# Patient Record
Sex: Female | Born: 1935 | Race: White | Hispanic: No | Smoking: Never smoker
Health system: Southern US, Community
[De-identification: ages and names within clinical notes are randomized; demographics above are authoritative.]

## PROBLEM LIST (undated history)

## (undated) DIAGNOSIS — I10 Essential (primary) hypertension: Secondary | ICD-10-CM

## (undated) DIAGNOSIS — C801 Malignant (primary) neoplasm, unspecified: Secondary | ICD-10-CM

## (undated) DIAGNOSIS — F0390 Unspecified dementia without behavioral disturbance: Secondary | ICD-10-CM

## (undated) DIAGNOSIS — E039 Hypothyroidism, unspecified: Secondary | ICD-10-CM

---

## 2004-01-06 ENCOUNTER — Ambulatory Visit: Payer: Self-pay | Admitting: Internal Medicine

## 2004-12-10 ENCOUNTER — Ambulatory Visit: Payer: Self-pay | Admitting: Urology

## 2005-01-14 ENCOUNTER — Ambulatory Visit: Payer: Self-pay | Admitting: Internal Medicine

## 2005-03-04 ENCOUNTER — Ambulatory Visit: Payer: Self-pay | Admitting: Obstetrics and Gynecology

## 2005-03-04 ENCOUNTER — Other Ambulatory Visit: Payer: Self-pay

## 2005-03-11 ENCOUNTER — Ambulatory Visit: Payer: Self-pay | Admitting: Obstetrics and Gynecology

## 2005-07-26 ENCOUNTER — Ambulatory Visit: Payer: Self-pay

## 2005-08-01 ENCOUNTER — Ambulatory Visit: Payer: Self-pay

## 2006-01-16 ENCOUNTER — Ambulatory Visit: Payer: Self-pay | Admitting: Internal Medicine

## 2006-01-20 ENCOUNTER — Ambulatory Visit: Payer: Self-pay | Admitting: Internal Medicine

## 2006-08-02 ENCOUNTER — Ambulatory Visit: Payer: Self-pay | Admitting: Unknown Physician Specialty

## 2007-01-19 ENCOUNTER — Ambulatory Visit: Payer: Self-pay | Admitting: Internal Medicine

## 2007-04-23 ENCOUNTER — Emergency Department: Payer: Self-pay | Admitting: Emergency Medicine

## 2007-07-05 ENCOUNTER — Ambulatory Visit: Payer: Self-pay | Admitting: Internal Medicine

## 2007-11-13 ENCOUNTER — Other Ambulatory Visit: Payer: Self-pay

## 2007-11-13 ENCOUNTER — Ambulatory Visit: Payer: Self-pay | Admitting: Ophthalmology

## 2007-11-26 ENCOUNTER — Ambulatory Visit: Payer: Self-pay | Admitting: Ophthalmology

## 2008-01-24 ENCOUNTER — Ambulatory Visit: Payer: Self-pay | Admitting: Internal Medicine

## 2009-01-29 ENCOUNTER — Ambulatory Visit: Payer: Self-pay | Admitting: Internal Medicine

## 2010-02-02 ENCOUNTER — Ambulatory Visit: Payer: Self-pay | Admitting: Internal Medicine

## 2011-03-10 ENCOUNTER — Ambulatory Visit: Payer: Self-pay | Admitting: Family Medicine

## 2013-05-02 ENCOUNTER — Ambulatory Visit: Payer: Self-pay | Admitting: Ophthalmology

## 2013-05-02 DIAGNOSIS — I10 Essential (primary) hypertension: Secondary | ICD-10-CM

## 2013-05-13 ENCOUNTER — Ambulatory Visit: Payer: Self-pay | Admitting: Ophthalmology

## 2014-07-26 NOTE — Op Note (Signed)
PATIENT NAME:  Michele Lambert, Michele Lambert MR#:  [id number] DATE OF BIRTH:  Mar 27, 1927  DATE OF PROCEDURE:  05/13/2013  PREOPERATIVE DIAGNOSIS:  Cataract, right eye.  POSTOPERATIVE DIAGNOSIS:  Cataract, right eye.  PROCEDURE PERFORMED:  Extracapsular cataract extraction using phacoemulsification with placement of an Alcon [id number], 18.5-diopter posterior chamber lens, serial number [id number].  SURGEON:  Loura Back. Taia Bramlett, MD  ASSISTANT:  None.  ANESTHESIA:  4% lidocaine and 0.75% Marcaine in a 50/50 mixture with Hylenex 10 units/mL added, given as a peribulbar.  ANESTHESIOLOGIST:  Dr. Marcello Moores.  COMPLICATIONS:  None.  ESTIMATED BLOOD LOSS:  Less than 1 ml.  DESCRIPTION OF PROCEDURE:  The patient was brought to the operating room and given a peribulbar block.  The patient was then prepped and draped in the usual fashion.  The vertical rectus muscles were imbricated using 5-0 silk sutures.  These sutures were then clamped to the sterile drapes as bridle sutures.  A limbal peritomy was performed extending two clock hours and hemostasis was obtained with cautery.  A partial thickness scleral groove was made at the surgical limbus and dissected anteriorly in a lamellar dissection using an Alcon crescent knife.  The anterior chamber was entered supero-temporally with a Superblade and through the lamellar dissection with a 2.6 mm keratome.  DisCoVisc was used to replace the aqueous and a continuous tear capsulorrhexis was carried out.  Hydrodissection and hydrodelineation were carried out with balanced salt and a 27 gauge canula.  The nucleus was rotated to confirm the effectiveness of the hydrodissection.  Phacoemulsification was carried out using a divide-and-conquer technique.  Total ultrasound time was 1 minute and 28 seconds with an average power of 24.4 percent.  CDE 35.29.    Irrigation/aspiration was used to remove the residual cortex.  DisCoVisc was used to inflate the capsule and the internal  incision was enlarged to 3 mm with the crescent knife.  The intraocular lens was folded and inserted into the capsular bag using an AcrySert delivery system.  Irrigation/aspiration was used to remove the residual DisCoVisc.  Miostat was injected into the anterior chamber through the paracentesis track to inflate the anterior chamber and induce miosis.  Cefuroxime 0.1 mL containing 1 mg of drug was injected via the paracentesis track.  The wound was checked for leaks and wound leakage was found.  A single 10-0 suture was placed across the incision, tied and the knot was rotated superiorly.  The conjunctiva was closed with cautery and the bridle sutures were removed.  Two drops of 0.3% Vigamox were placed on the eye.   An eye shield was placed on the eye.  The patient was discharged to the recovery room in good condition.  ____________________________ Loura Back Havoc Sanluis, MD sad:cs D: 05/13/2013 13:14:45 ET T: 05/13/2013 19:58:11 ET JOB#: [id number]  cc: Remo Lipps A. Christabelle Hanzlik, MD, <Dictator> Martie Lee MD ELECTRONICALLY SIGNED 05/20/2013 11:57

## 2017-11-21 ENCOUNTER — Other Ambulatory Visit: Payer: Self-pay

## 2017-11-21 ENCOUNTER — Encounter: Payer: Self-pay | Admitting: Emergency Medicine

## 2017-11-21 ENCOUNTER — Emergency Department
Admission: EM | Admit: 2017-11-21 | Discharge: 2017-11-22 | Disposition: A | Discharge status: Home / Self Care | Payer: Medicare Other | Attending: Emergency Medicine | Admitting: Emergency Medicine

## 2017-11-21 ENCOUNTER — Emergency Department: Payer: Medicare Other

## 2017-11-21 DIAGNOSIS — W19XXXA Unspecified fall, initial encounter: Secondary | ICD-10-CM

## 2017-11-21 DIAGNOSIS — E039 Hypothyroidism, unspecified: Secondary | ICD-10-CM | POA: Insufficient documentation

## 2017-11-21 DIAGNOSIS — T148XXA Other injury of unspecified body region, initial encounter: Secondary | ICD-10-CM

## 2017-11-21 DIAGNOSIS — S0993XA Unspecified injury of face, initial encounter: Secondary | ICD-10-CM | POA: Diagnosis present

## 2017-11-21 DIAGNOSIS — S0081XA Abrasion of other part of head, initial encounter: Secondary | ICD-10-CM | POA: Insufficient documentation

## 2017-11-21 DIAGNOSIS — Y998 Other external cause status: Secondary | ICD-10-CM | POA: Diagnosis not present

## 2017-11-21 DIAGNOSIS — Y929 Unspecified place or not applicable: Secondary | ICD-10-CM | POA: Insufficient documentation

## 2017-11-21 DIAGNOSIS — S022XXB Fracture of nasal bones, initial encounter for open fracture: Secondary | ICD-10-CM | POA: Insufficient documentation

## 2017-11-21 DIAGNOSIS — W010XXA Fall on same level from slipping, tripping and stumbling without subsequent striking against object, initial encounter: Secondary | ICD-10-CM | POA: Insufficient documentation

## 2017-11-21 DIAGNOSIS — F0390 Unspecified dementia without behavioral disturbance: Secondary | ICD-10-CM | POA: Insufficient documentation

## 2017-11-21 DIAGNOSIS — Y9301 Activity, walking, marching and hiking: Secondary | ICD-10-CM | POA: Insufficient documentation

## 2017-11-21 HISTORY — DX: Hypothyroidism, unspecified: E03.9

## 2017-11-21 MED ORDER — AMOXICILLIN-POT CLAVULANATE 875-125 MG PO TABS: 1.0000 | ORAL_TABLET | Freq: Two times a day (BID) | ORAL | 0 refills | Status: AC

## 2017-11-21 NOTE — ED Notes (Signed)
Pt cleaned with sterile water and 4x4s on the face.

## 2017-11-21 NOTE — Discharge Instructions (Addendum)
Please seek medical attention for any high fevers, chest pain, shortness of breath, change in behavior, persistent vomiting, bloody stool or any other new or concerning symptoms.  

## 2017-11-21 NOTE — ED Provider Notes (Signed)
[facility] Emergency Department Provider Note   ____________________________________________   I have reviewed the triage vital signs and the nursing notes.   HISTORY  Chief Complaint Fall   History limited by and level 5 caveat due to: Dementia, history primarily obtained from family   HPI Michele Lambert is a 84 y.o. female who presents to the emergency department today after a fall.  Family states she was walking on a course of gravel when she fell.  Apparently the fall was unwitnessed however family did get on scene almost immediately.  The patient was awake and was able to get herself up off the ground.  Patient herself is unsure why she fell.  Family however is fairly confident that she fell secondary to tripping.  No recent fevers or signs of illness per family. Family does state that she has memory issues.  Per medical record review patient has a history of hypothyrodism  Past Medical History:  Diagnosis Date  . Hypothyroidism     There are no active problems to display for this patient.   History reviewed. No pertinent surgical history.  Prior to Admission medications   Not on File    Allergies Codeine and Lescol [fluvastatin sodium]  No family history on file.  Social History Social History   Tobacco Use  . Smoking status: Never Smoker  . Smokeless tobacco: Never Used  Substance Use Topics  . Alcohol use: Not on file  . Drug use: Not on file    Review of Systems Unable to obtain reliable ROS secondary to dementia. ____________________________________________   PHYSICAL EXAM:  VITAL SIGNS: ED Triage Vitals  Enc Vitals Group     BP 11/21/17 2024 (!) 179/85     Pulse Rate 11/21/17 2024 84     Resp 11/21/17 2024 18     Temp 11/21/17 2024 97.9 F (36.6 C)     Temp Source 11/21/17 2024 Oral     SpO2 11/21/17 2024 97 %     Weight 11/21/17 2022 114 lb (51.7 kg)     Height 11/21/17 2022 5\' 3"  (1.6 m)     Head Circumference  --      Peak Flow --      Pain Score 11/21/17 2021 5   Constitutional: Alert and oriented.  Eyes: Conjunctivae are normal.  ENT      Head: Normocephalic. Abrasion noted to bridge of nose and left cheek bone. No hemotympanum.       Nose: Some dried blood noted in nares. No septal hematoma.       Mouth/Throat: Mucous membranes are moist.      Neck: No stridor. No midline tenderness. Hematological/Lymphatic/Immunilogical: No cervical lymphadenopathy. Cardiovascular: Normal rate, regular rhythm.  No murmurs, rubs, or gallops.  Respiratory: Normal respiratory effort without tachypnea nor retractions. Breath sounds are clear and equal bilaterally. No wheezes/rales/rhonchi. Gastrointestinal: Soft and non tender. No rebound. No guarding.  Genitourinary: Deferred Musculoskeletal: Normal range of motion in all extremities. No tenderness to upper or lower extremities. Small abrasion noted to left knee cap. Neurologic:   No gross focal neurologic deficits are appreciated.  Skin:  Skin is warm, dry and intact. No rash noted. Psychiatric: Mood and affect are normal. Speech and behavior are normal. Patient exhibits appropriate insight and judgment.  ____________________________________________    LABS (pertinent positives/negatives)  None  ____________________________________________   EKG  None  ____________________________________________    RADIOLOGY  CT head/c spine/max face Nasal bone fracture. No intracranial bleed.  ____________________________________________   PROCEDURES  Procedures  ____________________________________________   INITIAL IMPRESSION / ASSESSMENT AND PLAN / ED COURSE  Pertinent labs & imaging results that were available during my care of the patient were reviewed by me and considered in my medical decision making (see chart for details).   Patient presented to the emergency department today because of concerns for facial injury after tripping.  CT  scans face C-spine and head showed nasal bone fracture no other concerning acute findings.  Will start patient on antibiotics for the nasal bone fracture.  Discussed these findings plan with patient and family.   ____________________________________________   FINAL CLINICAL IMPRESSION(S) / ED DIAGNOSES  Final diagnoses:  Fall, initial encounter  Open fracture of nasal bone, initial encounter  Abrasion     Note: This dictation was prepared with Dragon dictation. Any transcriptional errors that result from this process are unintentional     Nance Pear, MD 11/21/17 2338

## 2017-11-21 NOTE — ED Triage Notes (Addendum)
Pt to triage via w/c with no distress noted; reports tripped outside falling forward; denies LOC or HA; c/o left sided facial pain; bruising to nose with small abrasions to nose, forehead and left cheek; wounds clensed; small abrasion noted to left knee but pt denies knee pain with palpation; ice pack given to pt to hold to face

## 2017-11-21 NOTE — ED Notes (Signed)
Pt fell today after she tripped on feet. Pt bleeding on face and knee. Bleeding is controlled.

## 2018-05-01 ENCOUNTER — Encounter (HOSPITAL_COMMUNITY): Payer: Self-pay | Admitting: Emergency Medicine

## 2018-05-01 ENCOUNTER — Emergency Department (HOSPITAL_COMMUNITY): Payer: Medicare Other

## 2018-05-01 ENCOUNTER — Inpatient Hospital Stay (HOSPITAL_COMMUNITY)
Admission: EM | Admit: 2018-05-01 | Discharge: 2018-05-06 | DRG: 493 | Disposition: A | Discharge status: Skilled Nursing Facility | Payer: Medicare Other | Attending: Family Medicine | Admitting: Family Medicine

## 2018-05-01 DIAGNOSIS — I10 Essential (primary) hypertension: Secondary | ICD-10-CM | POA: Diagnosis present

## 2018-05-01 DIAGNOSIS — S42301A Unspecified fracture of shaft of humerus, right arm, initial encounter for closed fracture: Secondary | ICD-10-CM | POA: Diagnosis present

## 2018-05-01 DIAGNOSIS — S42201A Unspecified fracture of upper end of right humerus, initial encounter for closed fracture: Secondary | ICD-10-CM

## 2018-05-01 DIAGNOSIS — S0181XA Laceration without foreign body of other part of head, initial encounter: Secondary | ICD-10-CM

## 2018-05-01 DIAGNOSIS — Z66 Do not resuscitate: Secondary | ICD-10-CM | POA: Diagnosis present

## 2018-05-01 DIAGNOSIS — Z885 Allergy status to narcotic agent status: Secondary | ICD-10-CM

## 2018-05-01 DIAGNOSIS — T148XXA Other injury of unspecified body region, initial encounter: Secondary | ICD-10-CM

## 2018-05-01 DIAGNOSIS — Z888 Allergy status to other drugs, medicaments and biological substances status: Secondary | ICD-10-CM

## 2018-05-01 DIAGNOSIS — R339 Retention of urine, unspecified: Secondary | ICD-10-CM | POA: Diagnosis present

## 2018-05-01 DIAGNOSIS — S42409A Unspecified fracture of lower end of unspecified humerus, initial encounter for closed fracture: Secondary | ICD-10-CM | POA: Diagnosis present

## 2018-05-01 DIAGNOSIS — R52 Pain, unspecified: Secondary | ICD-10-CM

## 2018-05-01 DIAGNOSIS — G9340 Encephalopathy, unspecified: Secondary | ICD-10-CM | POA: Diagnosis present

## 2018-05-01 DIAGNOSIS — S0990XA Unspecified injury of head, initial encounter: Secondary | ICD-10-CM

## 2018-05-01 DIAGNOSIS — Z79890 Hormone replacement therapy (postmenopausal): Secondary | ICD-10-CM

## 2018-05-01 DIAGNOSIS — R202 Paresthesia of skin: Secondary | ICD-10-CM | POA: Diagnosis present

## 2018-05-01 DIAGNOSIS — Y92015 Private garage of single-family (private) house as the place of occurrence of the external cause: Secondary | ICD-10-CM

## 2018-05-01 DIAGNOSIS — S42491A Other displaced fracture of lower end of right humerus, initial encounter for closed fracture: Principal | ICD-10-CM

## 2018-05-01 DIAGNOSIS — F0390 Unspecified dementia without behavioral disturbance: Secondary | ICD-10-CM | POA: Diagnosis present

## 2018-05-01 DIAGNOSIS — E039 Hypothyroidism, unspecified: Secondary | ICD-10-CM

## 2018-05-01 DIAGNOSIS — W1830XA Fall on same level, unspecified, initial encounter: Secondary | ICD-10-CM | POA: Diagnosis present

## 2018-05-01 DIAGNOSIS — Z419 Encounter for procedure for purposes other than remedying health state, unspecified: Secondary | ICD-10-CM

## 2018-05-01 DIAGNOSIS — R296 Repeated falls: Secondary | ICD-10-CM

## 2018-05-01 DIAGNOSIS — R410 Disorientation, unspecified: Secondary | ICD-10-CM | POA: Diagnosis present

## 2018-05-01 DIAGNOSIS — S0011XA Contusion of right eyelid and periocular area, initial encounter: Secondary | ICD-10-CM | POA: Diagnosis present

## 2018-05-01 HISTORY — DX: Unspecified dementia, unspecified severity, without behavioral disturbance, psychotic disturbance, mood disturbance, and anxiety: F03.90

## 2018-05-01 LAB — BASIC METABOLIC PANEL
Anion gap: 9 (ref 5–15)
BUN: 32 mg/dL — ABNORMAL HIGH (ref 8–23)
CO2: 25 mmol/L (ref 22–32)
Calcium: 8.8 mg/dL — ABNORMAL LOW (ref 8.9–10.3)
Chloride: 105 mmol/L (ref 98–111)
Creatinine, Ser: 0.99 mg/dL (ref 0.44–1.00)
GFR calc non Af Amer: 51 mL/min — ABNORMAL LOW (ref >60)
GFR, EST AFRICAN AMERICAN: 59 mL/min — AB (ref >60)
Glucose, Bld: 125 mg/dL — ABNORMAL HIGH (ref 70–99)
Potassium: 3.8 mmol/L (ref 3.5–5.1)
Sodium: 139 mmol/L (ref 135–145)

## 2018-05-01 LAB — CBC WITH DIFFERENTIAL/PLATELET
Abs Immature Granulocytes: 0.11 10*3/uL — ABNORMAL HIGH (ref 0.00–0.07)
BASOS PCT: 0 %
Basophils Absolute: 0 10*3/uL (ref 0.0–0.1)
EOS PCT: 0 %
Eosinophils Absolute: 0 10*3/uL (ref 0.0–0.5)
HCT: 38.3 % (ref 36.0–46.0)
Hemoglobin: 12.4 g/dL (ref 12.0–15.0)
Immature Granulocytes: 1 %
Lymphocytes Relative: 5 %
Lymphs Abs: 0.9 10*3/uL (ref 0.7–4.0)
MCH: 29.5 pg (ref 26.0–34.0)
MCHC: 32.4 g/dL (ref 30.0–36.0)
MCV: 91 fL (ref 80.0–100.0)
Monocytes Absolute: 0.9 10*3/uL (ref 0.1–1.0)
Monocytes Relative: 5 %
Neutro Abs: 15.6 10*3/uL — ABNORMAL HIGH (ref 1.7–7.7)
Neutrophils Relative %: 89 %
Platelets: 198 10*3/uL (ref 150–400)
RBC: 4.21 MIL/uL (ref 3.87–5.11)
RDW: 14.6 % (ref 11.5–15.5)
WBC: 17.6 10*3/uL — ABNORMAL HIGH (ref 4.0–10.5)
nRBC: 0 % (ref 0.0–0.2)

## 2018-05-01 LAB — CK: Total CK: 279 U/L — ABNORMAL HIGH (ref 38–234)

## 2018-05-01 MED ORDER — MORPHINE SULFATE (PF) 2 MG/ML IV SOLN
2.0000 mg | Freq: Once | INTRAVENOUS | Status: AC
  Administered 2018-05-01: 2 mg via INTRAVENOUS
  Filled 2018-05-01: qty 1

## 2018-05-01 MED ORDER — DEXTROSE 5 % IN LACTATED RINGERS IV BOLUS
500.0000 mL | Freq: Once | INTRAVENOUS | Status: AC
  Administered 2018-05-01: 500 mL via INTRAVENOUS

## 2018-05-01 MED ORDER — LACTATED RINGERS IV SOLN
INTRAVENOUS | Status: AC
  Administered 2018-05-02: via INTRAVENOUS

## 2018-05-01 NOTE — ED Notes (Signed)
Derma bond and wound cleanser at bedside

## 2018-05-01 NOTE — ED Provider Notes (Signed)
[facility] EMERGENCY DEPARTMENT Provider Note   CSN: [id number] Arrival date & time: 05/01/18  1707     History   Chief Complaint Chief Complaint  Patient presents with  . Fall    HPI Michele Lambert is a 85 y.o. female.  HPI Patient fell in her garage.  It was an unwitnessed fall.  Family members found the patient.  Family does report that her total downtime could not have been more than 2 hours based on when they had last seen her.  Patient's daughter reports that patient was lying with her right arm and extremely odd angle on her right side.  Patient reports that she does not have any pain anywhere besides her right arm.  She does have obvious contusion abrasion to the face but she denies headache.  She denies neck pain.  She denies chest pain.  She reports she does have some tenderness on the right leg and hip but denies significant back pain or problems with moving it.  Patient is not on any anticoagulants.  Patient is high functioning but does have dementia with difficulty with time recall.  Family members assist. Past Medical History:  Diagnosis Date  . Dementia ([place])   . Hypothyroidism     There are no active problems to display for this patient.   History reviewed. No pertinent surgical history.   OB History   No obstetric history on file.      Home Medications    Prior to Admission medications   Medication Sig Start Date End Date Taking? Authorizing Provider  Calcium Carbonate (CALCIUM 600 PO) Take 1,200 mg by mouth daily.   Yes [provider]  levothyroxine (SYNTHROID, LEVOTHROID) 88 MCG tablet Take 88 mcg by mouth daily before breakfast.   Yes [provider]    Family History History reviewed. No pertinent family history.  Social History Social History   Tobacco Use  . Smoking status: Never Smoker  . Smokeless tobacco: Never Used  Substance Use Topics  . Alcohol use: Not on file  . Drug use: Not on file      Allergies   Codeine and Lescol [fluvastatin sodium]   Review of Systems Review of Systems 10 Systems reviewed and are negative for acute change except as noted in the HPI. Physical Exam Updated Vital Signs BP (!) 164/93   Pulse 85   Temp (!) 97.3 F (36.3 C) (Oral)   Resp (!) 33   SpO2 97%   Physical Exam Constitutional:      Appearance: She is well-developed.     Comments: Patient is alert and interactive.  No respiratory distress.  HENT:     Head:     Comments: Hematoma to the right zygoma.  1 cm laceration to the right brow.  Slight gaping with no bleeding.    Nose: Nose normal.     Mouth/Throat:     Mouth: Mucous membranes are moist.  Eyes:     Extraocular Movements: Extraocular movements intact.     Pupils: Pupils are equal, round, and reactive to light.  Neck:     Musculoskeletal: Neck supple.     Comments: Cervical collar maintained. Cardiovascular:     Rate and Rhythm: Normal rate and regular rhythm.     Heart sounds: Normal heart sounds.  Pulmonary:     Effort: Pulmonary effort is normal.     Breath sounds: Normal breath sounds.  Chest:     Chest wall: No tenderness.  Abdominal:     General: Bowel sounds are normal. There is no distension.     Palpations: Abdomen is soft.     Tenderness: There is no abdominal tenderness.  Musculoskeletal:     Comments: Patient is right arm is in a long-arm splint.  She reports she has significant pain in the shoulder and the upper arm.  Patient is neurovascularly intact with fingers warm and sensation intact.  Patient can spontaneously move the digits.  Large swelling and deformity of the distal upper arm.  No apparent laceration but there is a patch of urticarial appearing erythema to the lateral aspect of the AC fossa and lower portion of upper arm that has sharp demarcations is approximately 6 x 7 cm.  No pain with range of motion of the lower extremities.  Both lower extremities can be flexed fully and patient can  push against resistance without difficulty.  Skin:    General: Skin is warm and dry.  Neurological:     General: No focal deficit present.     Mental Status: She is alert and oriented to person, place, and time.     GCS: GCS eye subscore is 4. GCS verbal subscore is 5. GCS motor subscore is 6.     Coordination: Coordination normal.      ED Treatments / Results  Labs (all labs ordered are listed, but only abnormal results are displayed) Labs Reviewed  BASIC METABOLIC PANEL - Abnormal; Notable for the following components:      Result Value   Glucose, Bld 125 (*)    BUN 32 (*)    Calcium 8.8 (*)    GFR calc non Af Amer 51 (*)    GFR calc Af Amer 59 (*)    All other components within normal limits  CBC WITH DIFFERENTIAL/PLATELET - Abnormal; Notable for the following components:   WBC 17.6 (*)    Neutro Abs 15.6 (*)    Abs Immature Granulocytes 0.11 (*)    All other components within normal limits  CK - Abnormal; Notable for the following components:   Total CK 279 (*)    All other components within normal limits    EKG None  Radiology Dg Shoulder Right  Result Date: 05/01/2018 CLINICAL DATA:  85 y/o  F; fall with deformity to the right humerus. EXAM: RIGHT HUMERUS - 2+ VIEW; RIGHT SHOULDER - 2+ VIEW COMPARISON:  None. FINDINGS: Right humerus: Acute oblique fracture of the distal right femoral diaphysis with 1 shaft's with lateral displacement of the proximal fracture component, 3.7 cm overriding, and apex anterolateral angulation. The elbow joint is maintained. Right shoulder: No acute fracture or dislocation the shoulder joint. Acromioclavicular and coracoclavicular distances are normal. Decreased acromial humeral distance may represent underlying rotator cuff injury. IMPRESSION: Acute oblique overriding and displaced fracture of the right distal humeral diaphysis. No dislocation. Electronically Signed   By: Kristine Garbe M.D.   On: 05/01/2018 22:05   Ct Head Wo  Contrast  Result Date: 05/01/2018 CLINICAL DATA:  85 year old female with head, face and neck injury following fall today. Initial encounter. EXAM: CT HEAD WITHOUT CONTRAST CT MAXILLOFACIAL WITHOUT CONTRAST CT CERVICAL SPINE WITHOUT CONTRAST TECHNIQUE: Multidetector CT imaging of the head, cervical spine, and maxillofacial structures were performed using the standard protocol without intravenous contrast. Multiplanar CT image reconstructions of the cervical spine and maxillofacial structures were also generated. COMPARISON:  11/21/2017 CTs FINDINGS: CT HEAD FINDINGS Brain: No evidence of acute infarction, hemorrhage, hydrocephalus, extra-axial collection or mass  lesion/mass effect. Atrophy and chronic small-vessel white matter ischemic changes again noted. Vascular: Carotid atherosclerotic calcifications again noted. Skull: Normal. Negative for fracture or focal lesion. Other: None. CT MAXILLOFACIAL FINDINGS Osseous: No acute fracture or dislocation. No suspicious focal bony abnormalities noted. Orbits: Negative. No traumatic or inflammatory finding. Sinuses: Clear. Soft tissues: Negative. CT CERVICAL SPINE FINDINGS Alignment: No acute subluxation. Skull base and vertebrae: No acute fracture. No primary bone lesion or focal pathologic process. Soft tissues and spinal canal: No prevertebral fluid or swelling. No visible canal hematoma. Disc levels: Mild to moderate multilevel degenerative disc disease/spondylosis again noted. Upper chest: No acute abnormality. Other: None IMPRESSION: 1. No evidence of acute intracranial abnormality. Atrophy and chronic small-vessel white matter ischemic changes. 2. No evidence of acute facial injury. 3. No static evidence of acute injury to the cervical spine. Unchanged mild to moderate multilevel degenerative disc disease/spondylosis. Electronically Signed   By: Margarette Canada M.D.   On: 05/01/2018 21:57   Ct Cervical Spine Wo Contrast  Result Date: 05/01/2018 CLINICAL DATA:   85 year old female with head, face and neck injury following fall today. Initial encounter. EXAM: CT HEAD WITHOUT CONTRAST CT MAXILLOFACIAL WITHOUT CONTRAST CT CERVICAL SPINE WITHOUT CONTRAST TECHNIQUE: Multidetector CT imaging of the head, cervical spine, and maxillofacial structures were performed using the standard protocol without intravenous contrast. Multiplanar CT image reconstructions of the cervical spine and maxillofacial structures were also generated. COMPARISON:  11/21/2017 CTs FINDINGS: CT HEAD FINDINGS Brain: No evidence of acute infarction, hemorrhage, hydrocephalus, extra-axial collection or mass lesion/mass effect. Atrophy and chronic small-vessel white matter ischemic changes again noted. Vascular: Carotid atherosclerotic calcifications again noted. Skull: Normal. Negative for fracture or focal lesion. Other: None. CT MAXILLOFACIAL FINDINGS Osseous: No acute fracture or dislocation. No suspicious focal bony abnormalities noted. Orbits: Negative. No traumatic or inflammatory finding. Sinuses: Clear. Soft tissues: Negative. CT CERVICAL SPINE FINDINGS Alignment: No acute subluxation. Skull base and vertebrae: No acute fracture. No primary bone lesion or focal pathologic process. Soft tissues and spinal canal: No prevertebral fluid or swelling. No visible canal hematoma. Disc levels: Mild to moderate multilevel degenerative disc disease/spondylosis again noted. Upper chest: No acute abnormality. Other: None IMPRESSION: 1. No evidence of acute intracranial abnormality. Atrophy and chronic small-vessel white matter ischemic changes. 2. No evidence of acute facial injury. 3. No static evidence of acute injury to the cervical spine. Unchanged mild to moderate multilevel degenerative disc disease/spondylosis. Electronically Signed   By: Margarette Canada M.D.   On: 05/01/2018 21:57   Dg Chest Port 1 View  Result Date: 05/01/2018 CLINICAL DATA:  Pain. Found down. EXAM: PORTABLE CHEST 1 VIEW COMPARISON:  None.  FINDINGS: Low lung volumes.The cardiomediastinal contours are normal. Atherosclerosis of the thoracic aorta. Pulmonary vasculature is normal. Skin fold projects over the left chest. No consolidation, pleural effusion, or pneumothorax. No acute osseous abnormalities are seen. IMPRESSION: Low lung volumes without acute abnormality. Electronically Signed   By: Keith Rake M.D.   On: 05/01/2018 22:18   Dg Humerus Right  Result Date: 05/01/2018 CLINICAL DATA:  85 y/o  F; fall with deformity to the right humerus. EXAM: RIGHT HUMERUS - 2+ VIEW; RIGHT SHOULDER - 2+ VIEW COMPARISON:  None. FINDINGS: Right humerus: Acute oblique fracture of the distal right femoral diaphysis with 1 shaft's with lateral displacement of the proximal fracture component, 3.7 cm overriding, and apex anterolateral angulation. The elbow joint is maintained. Right shoulder: No acute fracture or dislocation the shoulder joint. Acromioclavicular and coracoclavicular distances are normal.  Decreased acromial humeral distance may represent underlying rotator cuff injury. IMPRESSION: Acute oblique overriding and displaced fracture of the right distal humeral diaphysis. No dislocation. Electronically Signed   By: Kristine Garbe M.D.   On: 05/01/2018 22:05   Ct Maxillofacial Wo Cm  Result Date: 05/01/2018 CLINICAL DATA:  85 year old female with head, face and neck injury following fall today. Initial encounter. EXAM: CT HEAD WITHOUT CONTRAST CT MAXILLOFACIAL WITHOUT CONTRAST CT CERVICAL SPINE WITHOUT CONTRAST TECHNIQUE: Multidetector CT imaging of the head, cervical spine, and maxillofacial structures were performed using the standard protocol without intravenous contrast. Multiplanar CT image reconstructions of the cervical spine and maxillofacial structures were also generated. COMPARISON:  11/21/2017 CTs FINDINGS: CT HEAD FINDINGS Brain: No evidence of acute infarction, hemorrhage, hydrocephalus, extra-axial collection or mass  lesion/mass effect. Atrophy and chronic small-vessel white matter ischemic changes again noted. Vascular: Carotid atherosclerotic calcifications again noted. Skull: Normal. Negative for fracture or focal lesion. Other: None. CT MAXILLOFACIAL FINDINGS Osseous: No acute fracture or dislocation. No suspicious focal bony abnormalities noted. Orbits: Negative. No traumatic or inflammatory finding. Sinuses: Clear. Soft tissues: Negative. CT CERVICAL SPINE FINDINGS Alignment: No acute subluxation. Skull base and vertebrae: No acute fracture. No primary bone lesion or focal pathologic process. Soft tissues and spinal canal: No prevertebral fluid or swelling. No visible canal hematoma. Disc levels: Mild to moderate multilevel degenerative disc disease/spondylosis again noted. Upper chest: No acute abnormality. Other: None IMPRESSION: 1. No evidence of acute intracranial abnormality. Atrophy and chronic small-vessel white matter ischemic changes. 2. No evidence of acute facial injury. 3. No static evidence of acute injury to the cervical spine. Unchanged mild to moderate multilevel degenerative disc disease/spondylosis. Electronically Signed   By: Margarette Canada M.D.   On: 05/01/2018 21:57    Procedures Procedures (including critical care time) SPLINT APPLICATION Date/Time: 5:17 PM Authorized by: Charlesetta Shanks Consent: Verbal consent obtained. Risks and benefits: risks, benefits and alternatives were discussed Consent given by: patient Splint applied [id number] and orthopedic technician Location details:right arm Splint type: long arm with sugar tong Supplies used: orthoglass, ace wraps Post-procedure: The splinted body part was neurovascularly unchanged following the procedure. Patient tolerance: Patient tolerated the procedure well with no immediate complications. LACERATION REPAIR Performed by: Charlesetta Shanks Authorized by: Charlesetta Shanks Consent: Verbal consent obtained. Risks and benefits: risks,  benefits and alternatives were discussed Consent given by: patient Patient identity confirmed: provided demographic data Prepped and Draped in normal sterile fashion Wound explored  Laceration Location: right brow  Laceration Length: 1.5 cm  No Foreign Bodies seen or palpated  Anesthesia: local infiltration  Local anesthetic:none  Amount of cleaning: standard  Skin closure: tissue glue    Patient tolerance: Patient tolerated the procedure well with no immediate complications.  Medications Ordered in ED Medications  lactated ringers infusion (has no administration in time range)  morphine 2 MG/ML injection 2 mg (has no administration in time range)  dextrose 5% lactated ringers bolus 500 mL (has no administration in time range)  morphine 2 MG/ML injection 2 mg (2 mg Intravenous Given 05/01/18 1959)     Initial Impression / Assessment and Plan / ED Course  I have reviewed the triage vital signs and the nursing notes.  Pertinent labs & imaging results that were available during my care of the patient were reviewed by me and considered in my medical decision making (see chart for details).  Clinical Course as of May 05 1609  Tue May 01, 2018  2320 Consult: Reviewed Dr. Ihor Gully  orthopedic surgery.  Requested hanging arm splint.  Requests hospitalist admission for medical management and will assess tomorrow for potential surgical repair.   [MP]    Clinical Course User Index [MP] Charlesetta Shanks, MD   Patient does not have headache or mental status change.  CT shows no intracranial injury.  Patient does not have chest pain or shortness of breath.  Findings are 4 significant distal humerus fracture.  For age, patient is in good baseline condition.  She remains independent and active.  She does have some degree of dementia but high functioning.  Has been reviewed with orthopedics for admission for surgical repair.  Final Clinical Impressions(s) / ED Diagnoses   Final diagnoses:   Closed fracture of proximal end of right humerus, unspecified fracture morphology, initial encounter  Injury of head, initial encounter  Facial laceration, initial encounter    ED Discharge Orders    None       Charlesetta Shanks, MD 05/05/18 1616

## 2018-05-01 NOTE — ED Notes (Signed)
Pt's grandson states she could have been outside a max of 20 min, because she had a caregiver that had just left before he arrived.

## 2018-05-01 NOTE — ED Triage Notes (Signed)
Pt arrived via EMS. Pt found by grandson  On the ground outside of her house. Pt was alert when he found her, but she did not recall the fall. Pt has deformity to right humerus. Pt has lac to right eyebrow. Pt complains of right arm pain. Pt has hx of dementia, but arrived to ED alert. Bruising to right cheek. BP 166/95, HR 86, CBG 150, 98% on room air. EMS gave 176mcg fentanyl.

## 2018-05-01 NOTE — ED Notes (Signed)
Admitting provider bedside 

## 2018-05-01 NOTE — ED Notes (Signed)
Paged ortho tech 

## 2018-05-01 NOTE — H&P (Signed)
History and Physical    Michele Lambert:[id number] DOB: 01-01-1927 [name]: 05/01/2018  PCP: Michele Body, MD  Patient coming from: Home  I have personally briefly reviewed patient's old medical records in [place]  Chief Complaint: Fall at home  HPI: Michele Lambert is a 85 y.o. female with medical history significant for Hypothyroidism and Dementia who presents to the ED after an unwitnessed fall at home.  History is limited from patient due to underlying dementia, and therefore majority of history is obtained from family at bedside and chart review.  Patient apparently had an unwitnessed fall in her garage sometime between 2-4 PM on 05/01/2018.  She was at her baseline when her home caregiver left the house around 2 PM.  Patient's grandson went to see her around 4 PM and found her lying on the ground of the garage with her right arm extended out and above her head in an odd angle.    Patient herself currently denies any chest pain, palpitations, lightheadedness, dizziness, nausea, vomiting, dyspnea, abdominal pain, diarrhea, constipation, dysuria.  She reports some pain in her right arm and right eyebrow.  Per family, at baseline patient is fairly high functioning and ambulates without assist but does have underlying dementia with occasional episodes of confusion and difficulty with recall.  ED Course:  Initial vitals showed BP 178/81, pulse 77, RR 20, temp 97.3 Fahrenheit, SPO2 97% on room air.  Labs notable for WBC 17.6, hemoglobin 12.4, platelets 198, BUN 32, creatinine 0.99, CK 279.  CT head/cervical spine/maxillofacial without contrast showed no evidence of acute intracranial abnormality, acute facial injury, or evidence of acute injury to the cervical spine.  Portable chest x-ray showed low lung volumes without focal consolidation, effusion, or vascular congestion.  Aortic atherosclerosis is noted.  X-rays of the right shoulder and humerus showed acute oblique overriding  and displaced fracture of the right distal humeral diaphysis without dislocation.  She was given 500 mL bolus of D5 LR and IV morphine for pain.  Orthopedics, Dr. Alvan Dame, was consulted by EDP and recommended splinting of right humeral fracture and will see in a.m.  Hospitalist service was consulted to admit.   Review of Systems: As per HPI otherwise 10 point review of systems negative.    Past Medical History:  Diagnosis Date  . Dementia ([place])   . Hypothyroidism     History reviewed. No pertinent surgical history.   reports that she has never smoked. She has never used smokeless tobacco. No history on file for alcohol and drug.  Allergies  Allergen Reactions  . Codeine   . Lescol [Fluvastatin Sodium]     Family History  Problem Relation Age of Onset  . Stroke Mother      Prior to Admission medications   Medication Sig Start Date End Date Taking? Authorizing Provider  Calcium Carbonate (CALCIUM 600 PO) Take 1,200 mg by mouth daily.   Yes [provider]  levothyroxine (SYNTHROID, LEVOTHROID) 88 MCG tablet Take 88 mcg by mouth daily before breakfast.   Yes [provider]    Physical Exam: Vitals:   05/01/18 2015 05/01/18 2230 05/02/18 0008 05/02/18 0015  BP: (!) 164/93  138/71 132/78  Pulse: 85 81 79 72  Resp: (!) 33     Temp:      TempSrc:      SpO2: 97% 99% 96% 96%    Constitutional: Elderly woman resting supine in bed, NAD, calm, comfortable Eyes: PERRL, lids and conjunctivae normal, 1.5 cm  laceration across right eyebrow with slight bleeding ENMT: Mucous membranes are dry. Posterior pharynx clear of any exudate or lesions. Normal dentition.  Neck: normal, supple, no masses. Respiratory: clear to auscultation anteriorly, no wheezing, no crackles. Normal respiratory effort. No accessory muscle use.  Cardiovascular: Regular rate and rhythm, soft systolic murmur present. No extremity edema. 2+ pedal pulses. Abdomen: no tenderness, no masses  palpated. No hepatosplenomegaly. Bowel sounds positive.  Musculoskeletal: RUE in long-arm splint and sling, able to move right hand digits spontaneously, range of motion of left upper extremity and both lower extremities intact. Skin: Small laceration across right eyebrow Neurologic: CN 2-12 grossly intact. Sensation intact, Strength 5/5 in left upper extremity and both lower extremities. Psychiatric: Awake and alert to person but not place and time.  Normal mood.   Labs on Admission: I have personally reviewed following labs and imaging studies  CBC: Recent Labs  Lab 05/01/18 2002  WBC 17.6*  NEUTROABS 15.6*  HGB 12.4  HCT 38.3  MCV 91.0  PLT 509   Basic Metabolic Panel: Recent Labs  Lab 05/01/18 2002  NA 139  K 3.8  CL 105  CO2 25  GLUCOSE 125*  BUN 32*  CREATININE 0.99  CALCIUM 8.8*   GFR: CrCl cannot be calculated (Unknown ideal weight.). Liver Function Tests: No results for input(s): AST, ALT, ALKPHOS, BILITOT, PROT, ALBUMIN in the last 168 hours. No results for input(s): LIPASE, AMYLASE in the last 168 hours. No results for input(s): AMMONIA in the last 168 hours. Coagulation Profile: No results for input(s): INR, PROTIME in the last 168 hours. Cardiac Enzymes: Recent Labs  Lab 05/01/18 2002  CKTOTAL 279*   BNP (last 3 results) No results for input(s): PROBNP in the last 8760 hours. HbA1C: No results for input(s): HGBA1C in the last 72 hours. CBG: No results for input(s): GLUCAP in the last 168 hours. Lipid Profile: No results for input(s): CHOL, HDL, LDLCALC, TRIG, CHOLHDL, LDLDIRECT in the last 72 hours. Thyroid Function Tests: No results for input(s): TSH, T4TOTAL, FREET4, T3FREE, THYROIDAB in the last 72 hours. Anemia Panel: No results for input(s): VITAMINB12, FOLATE, FERRITIN, TIBC, IRON, RETICCTPCT in the last 72 hours. Urine analysis: No results found for: COLORURINE, APPEARANCEUR, LABSPEC, PHURINE, GLUCOSEU, HGBUR, BILIRUBINUR, KETONESUR,  PROTEINUR, UROBILINOGEN, NITRITE, LEUKOCYTESUR  Radiological Exams on Admission: Dg Shoulder Right  Result Date: 05/01/2018 CLINICAL DATA:  85 y/o  F; fall with deformity to the right humerus. EXAM: RIGHT HUMERUS - 2+ VIEW; RIGHT SHOULDER - 2+ VIEW COMPARISON:  None. FINDINGS: Right humerus: Acute oblique fracture of the distal right femoral diaphysis with 1 shaft's with lateral displacement of the proximal fracture component, 3.7 cm overriding, and apex anterolateral angulation. The elbow joint is maintained. Right shoulder: No acute fracture or dislocation the shoulder joint. Acromioclavicular and coracoclavicular distances are normal. Decreased acromial humeral distance may represent underlying rotator cuff injury. IMPRESSION: Acute oblique overriding and displaced fracture of the right distal humeral diaphysis. No dislocation. Electronically Signed   By: Kristine Garbe M.D.   On: 05/01/2018 22:05   Ct Head Wo Contrast  Result Date: 05/01/2018 CLINICAL DATA:  85 year old female with head, face and neck injury following fall today. Initial encounter. EXAM: CT HEAD WITHOUT CONTRAST CT MAXILLOFACIAL WITHOUT CONTRAST CT CERVICAL SPINE WITHOUT CONTRAST TECHNIQUE: Multidetector CT imaging of the head, cervical spine, and maxillofacial structures were performed using the standard protocol without intravenous contrast. Multiplanar CT image reconstructions of the cervical spine and maxillofacial structures were also generated. COMPARISON:  11/21/2017  CTs FINDINGS: CT HEAD FINDINGS Brain: No evidence of acute infarction, hemorrhage, hydrocephalus, extra-axial collection or mass lesion/mass effect. Atrophy and chronic small-vessel white matter ischemic changes again noted. Vascular: Carotid atherosclerotic calcifications again noted. Skull: Normal. Negative for fracture or focal lesion. Other: None. CT MAXILLOFACIAL FINDINGS Osseous: No acute fracture or dislocation. No suspicious focal bony abnormalities  noted. Orbits: Negative. No traumatic or inflammatory finding. Sinuses: Clear. Soft tissues: Negative. CT CERVICAL SPINE FINDINGS Alignment: No acute subluxation. Skull base and vertebrae: No acute fracture. No primary bone lesion or focal pathologic process. Soft tissues and spinal canal: No prevertebral fluid or swelling. No visible canal hematoma. Disc levels: Mild to moderate multilevel degenerative disc disease/spondylosis again noted. Upper chest: No acute abnormality. Other: None IMPRESSION: 1. No evidence of acute intracranial abnormality. Atrophy and chronic small-vessel white matter ischemic changes. 2. No evidence of acute facial injury. 3. No static evidence of acute injury to the cervical spine. Unchanged mild to moderate multilevel degenerative disc disease/spondylosis. Electronically Signed   By: Margarette Canada M.D.   On: 05/01/2018 21:57   Ct Cervical Spine Wo Contrast  Result Date: 05/01/2018 CLINICAL DATA:  85 year old female with head, face and neck injury following fall today. Initial encounter. EXAM: CT HEAD WITHOUT CONTRAST CT MAXILLOFACIAL WITHOUT CONTRAST CT CERVICAL SPINE WITHOUT CONTRAST TECHNIQUE: Multidetector CT imaging of the head, cervical spine, and maxillofacial structures were performed using the standard protocol without intravenous contrast. Multiplanar CT image reconstructions of the cervical spine and maxillofacial structures were also generated. COMPARISON:  11/21/2017 CTs FINDINGS: CT HEAD FINDINGS Brain: No evidence of acute infarction, hemorrhage, hydrocephalus, extra-axial collection or mass lesion/mass effect. Atrophy and chronic small-vessel white matter ischemic changes again noted. Vascular: Carotid atherosclerotic calcifications again noted. Skull: Normal. Negative for fracture or focal lesion. Other: None. CT MAXILLOFACIAL FINDINGS Osseous: No acute fracture or dislocation. No suspicious focal bony abnormalities noted. Orbits: Negative. No traumatic or inflammatory  finding. Sinuses: Clear. Soft tissues: Negative. CT CERVICAL SPINE FINDINGS Alignment: No acute subluxation. Skull base and vertebrae: No acute fracture. No primary bone lesion or focal pathologic process. Soft tissues and spinal canal: No prevertebral fluid or swelling. No visible canal hematoma. Disc levels: Mild to moderate multilevel degenerative disc disease/spondylosis again noted. Upper chest: No acute abnormality. Other: None IMPRESSION: 1. No evidence of acute intracranial abnormality. Atrophy and chronic small-vessel white matter ischemic changes. 2. No evidence of acute facial injury. 3. No static evidence of acute injury to the cervical spine. Unchanged mild to moderate multilevel degenerative disc disease/spondylosis. Electronically Signed   By: Margarette Canada M.D.   On: 05/01/2018 21:57   Dg Chest Port 1 View  Result Date: 05/01/2018 CLINICAL DATA:  Pain. Found down. EXAM: PORTABLE CHEST 1 VIEW COMPARISON:  None. FINDINGS: Low lung volumes.The cardiomediastinal contours are normal. Atherosclerosis of the thoracic aorta. Pulmonary vasculature is normal. Skin fold projects over the left chest. No consolidation, pleural effusion, or pneumothorax. No acute osseous abnormalities are seen. IMPRESSION: Low lung volumes without acute abnormality. Electronically Signed   By: Keith Rake M.D.   On: 05/01/2018 22:18   Dg Humerus Right  Result Date: 05/01/2018 CLINICAL DATA:  85 y/o  F; fall with deformity to the right humerus. EXAM: RIGHT HUMERUS - 2+ VIEW; RIGHT SHOULDER - 2+ VIEW COMPARISON:  None. FINDINGS: Right humerus: Acute oblique fracture of the distal right femoral diaphysis with 1 shaft's with lateral displacement of the proximal fracture component, 3.7 cm overriding, and apex anterolateral angulation. The elbow joint is  maintained. Right shoulder: No acute fracture or dislocation the shoulder joint. Acromioclavicular and coracoclavicular distances are normal. Decreased acromial humeral  distance may represent underlying rotator cuff injury. IMPRESSION: Acute oblique overriding and displaced fracture of the right distal humeral diaphysis. No dislocation. Electronically Signed   By: Kristine Garbe M.D.   On: 05/01/2018 22:05   Ct Maxillofacial Wo Cm  Result Date: 05/01/2018 CLINICAL DATA:  85 year old female with head, face and neck injury following fall today. Initial encounter. EXAM: CT HEAD WITHOUT CONTRAST CT MAXILLOFACIAL WITHOUT CONTRAST CT CERVICAL SPINE WITHOUT CONTRAST TECHNIQUE: Multidetector CT imaging of the head, cervical spine, and maxillofacial structures were performed using the standard protocol without intravenous contrast. Multiplanar CT image reconstructions of the cervical spine and maxillofacial structures were also generated. COMPARISON:  11/21/2017 CTs FINDINGS: CT HEAD FINDINGS Brain: No evidence of acute infarction, hemorrhage, hydrocephalus, extra-axial collection or mass lesion/mass effect. Atrophy and chronic small-vessel white matter ischemic changes again noted. Vascular: Carotid atherosclerotic calcifications again noted. Skull: Normal. Negative for fracture or focal lesion. Other: None. CT MAXILLOFACIAL FINDINGS Osseous: No acute fracture or dislocation. No suspicious focal bony abnormalities noted. Orbits: Negative. No traumatic or inflammatory finding. Sinuses: Clear. Soft tissues: Negative. CT CERVICAL SPINE FINDINGS Alignment: No acute subluxation. Skull base and vertebrae: No acute fracture. No primary bone lesion or focal pathologic process. Soft tissues and spinal canal: No prevertebral fluid or swelling. No visible canal hematoma. Disc levels: Mild to moderate multilevel degenerative disc disease/spondylosis again noted. Upper chest: No acute abnormality. Other: None IMPRESSION: 1. No evidence of acute intracranial abnormality. Atrophy and chronic small-vessel white matter ischemic changes. 2. No evidence of acute facial injury. 3. No static  evidence of acute injury to the cervical spine. Unchanged mild to moderate multilevel degenerative disc disease/spondylosis. Electronically Signed   By: Margarette Canada M.D.   On: 05/01/2018 21:57    EKG: Independently reviewed.  Ordered and pending.  Assessment/Plan Principal Problem:   Displaced fracture of right humerus Active Problems:   Hypothyroidism   Dementia ([place])   Unwitnessed fall  Lurae L Gillham is a 85 y.o. female with medical history significant for Hypothyroidism and Dementia who presents to the ED after an unwitnessed fall at home found to have a displaced fracture of the right distal humeral diaphysis without dislocation.   Acute oblique overriding and displaced fracture of the right distal humerus: Occurred after unwitnessed fall.  Patient placed in long-arm splint which is wrapped and placed in a sling for downward traction.  -Orthopedics consulted and to see in a.m. -Pain control with as needed Tylenol, OxyIR, can use low-dose IV morphine if needed -Head of bed 30 degrees   Unwitnessed fall: Patient does not recall any of the conditions surrounding the event.  She is reportedly freely mobile at baseline without the need to use assistive devices. -Monitor on telemetry -PT/OT eval -Fall precautions -Gentle IV fluids overnight  Hypothyroidism: Last TSH 2.972, free T4 1.06 on 03/30/2018. -Continue Synthroid  Dementia: -Delirium precautions  DVT prophylaxis: subq heparin Code Status: DNR, per discussion with daughter and son Family Communication: Discussed with daughter and son at bedside Disposition Plan: Pending orthopedic and PT/OT recommendations Consults called: Orthopedics, Dr. Alvan Dame consulted by EDP Admission status: Observation   Zada Finders MD [facility] Pager [phone]  If 7PM-7AM, please contact night-coverage [email or web address]  05/02/2018, 12:33 AM

## 2018-05-02 ENCOUNTER — Observation Stay (HOSPITAL_COMMUNITY): Payer: Medicare Other

## 2018-05-02 ENCOUNTER — Encounter (HOSPITAL_COMMUNITY): Payer: Self-pay

## 2018-05-02 ENCOUNTER — Encounter (HOSPITAL_COMMUNITY)
Admission: EM | Disposition: A | Discharge status: Skilled Nursing Facility | Payer: Self-pay | Source: Home / Self Care | Attending: Family Medicine

## 2018-05-02 ENCOUNTER — Other Ambulatory Visit: Payer: Self-pay

## 2018-05-02 ENCOUNTER — Observation Stay (HOSPITAL_COMMUNITY): Payer: Medicare Other | Admitting: Anesthesiology

## 2018-05-02 DIAGNOSIS — S42491A Other displaced fracture of lower end of right humerus, initial encounter for closed fracture: Secondary | ICD-10-CM | POA: Diagnosis not present

## 2018-05-02 HISTORY — PX: ORIF HUMERUS FRACTURE: SHX2126

## 2018-05-02 LAB — SURGICAL PCR SCREEN
MRSA, PCR: NEGATIVE
Staphylococcus aureus: NEGATIVE

## 2018-05-02 LAB — BASIC METABOLIC PANEL
Anion gap: 8 (ref 5–15)
BUN: 30 mg/dL — ABNORMAL HIGH (ref 8–23)
CALCIUM: 8.6 mg/dL — AB (ref 8.9–10.3)
CO2: 25 mmol/L (ref 22–32)
CREATININE: 0.91 mg/dL (ref 0.44–1.00)
Chloride: 107 mmol/L (ref 98–111)
GFR calc Af Amer: >60 mL/min (ref >60)
GFR calc non Af Amer: 56 mL/min — ABNORMAL LOW (ref >60)
Glucose, Bld: 126 mg/dL — ABNORMAL HIGH (ref 70–99)
Potassium: 3.9 mmol/L (ref 3.5–5.1)
Sodium: 140 mmol/L (ref 135–145)

## 2018-05-02 LAB — CBC
HCT: 33.9 % — ABNORMAL LOW (ref 36.0–46.0)
Hemoglobin: 10.8 g/dL — ABNORMAL LOW (ref 12.0–15.0)
MCH: 28.7 pg (ref 26.0–34.0)
MCHC: 31.9 g/dL (ref 30.0–36.0)
MCV: 90.2 fL (ref 80.0–100.0)
Platelets: 129 10*3/uL — ABNORMAL LOW (ref 150–400)
RBC: 3.76 MIL/uL — ABNORMAL LOW (ref 3.87–5.11)
RDW: 14.4 % (ref 11.5–15.5)
WBC: 11.4 10*3/uL — ABNORMAL HIGH (ref 4.0–10.5)
nRBC: 0 % (ref 0.0–0.2)

## 2018-05-02 SURGERY — OPEN REDUCTION INTERNAL FIXATION (ORIF) DISTAL HUMERUS FRACTURE
Anesthesia: General | Site: Arm Upper | Laterality: Right

## 2018-05-02 MED ORDER — ROCURONIUM BROMIDE 50 MG/5ML IV SOSY
PREFILLED_SYRINGE | INTRAVENOUS | Status: AC
  Filled 2018-05-02: qty 15

## 2018-05-02 MED ORDER — LEVOTHYROXINE SODIUM 88 MCG PO TABS
88.0000 ug | ORAL_TABLET | Freq: Every day | ORAL | Status: DC
  Administered 2018-05-02 – 2018-05-05 (×3): 88 ug via ORAL
  Filled 2018-05-02 (×4): qty 1

## 2018-05-02 MED ORDER — CEFAZOLIN SODIUM-DEXTROSE 2-4 GM/100ML-% IV SOLN
2.0000 g | INTRAVENOUS | Status: AC
  Administered 2018-05-02: 2 g via INTRAVENOUS
  Filled 2018-05-02: qty 100

## 2018-05-02 MED ORDER — FENTANYL CITRATE (PF) 250 MCG/5ML IJ SOLN
INTRAMUSCULAR | Status: AC
  Filled 2018-05-02: qty 5

## 2018-05-02 MED ORDER — BUPIVACAINE LIPOSOME 1.3 % IJ SUSP
INTRAMUSCULAR | Status: DC | PRN
  Administered 2018-05-02: 10 mL

## 2018-05-02 MED ORDER — DEXAMETHASONE SODIUM PHOSPHATE 10 MG/ML IJ SOLN
INTRAMUSCULAR | Status: DC | PRN
  Administered 2018-05-02: 5 mg via INTRAVENOUS

## 2018-05-02 MED ORDER — MIDAZOLAM HCL 2 MG/2ML IJ SOLN
INTRAMUSCULAR | Status: AC
  Filled 2018-05-02: qty 2

## 2018-05-02 MED ORDER — BUPIVACAINE HCL (PF) 0.5 % IJ SOLN
INTRAMUSCULAR | Status: DC | PRN
  Administered 2018-05-02: 15 mL via PERINEURAL

## 2018-05-02 MED ORDER — FENTANYL CITRATE (PF) 100 MCG/2ML IJ SOLN
INTRAMUSCULAR | Status: AC
  Administered 2018-05-02: 25 ug via INTRAVENOUS
  Filled 2018-05-02: qty 2

## 2018-05-02 MED ORDER — ONDANSETRON HCL 4 MG/2ML IJ SOLN
INTRAMUSCULAR | Status: AC
  Filled 2018-05-02: qty 4

## 2018-05-02 MED ORDER — CEFAZOLIN SODIUM-DEXTROSE 1-4 GM/50ML-% IV SOLN
1.0000 g | Freq: Three times a day (TID) | INTRAVENOUS | Status: AC
  Administered 2018-05-02 (×2): 1 g via INTRAVENOUS
  Filled 2018-05-02 (×2): qty 50

## 2018-05-02 MED ORDER — OXYCODONE HCL 5 MG PO TABS: 5.0000 mg | ORAL_TABLET | ORAL | Status: DC | PRN

## 2018-05-02 MED ORDER — BUPIVACAINE HCL (PF) 0.25 % IJ SOLN
INTRAMUSCULAR | Status: AC
  Filled 2018-05-02: qty 30

## 2018-05-02 MED ORDER — LACTATED RINGERS IV SOLN
INTRAVENOUS | Status: DC
  Administered 2018-05-02: 12:00:00 via INTRAVENOUS

## 2018-05-02 MED ORDER — ONDANSETRON HCL 4 MG PO TABS: 4.0000 mg | ORAL_TABLET | Freq: Four times a day (QID) | ORAL | Status: DC | PRN

## 2018-05-02 MED ORDER — FENTANYL CITRATE (PF) 100 MCG/2ML IJ SOLN
25.0000 ug | Freq: Once | INTRAMUSCULAR | Status: AC
  Administered 2018-05-02: 25 ug via INTRAVENOUS

## 2018-05-02 MED ORDER — SENNOSIDES-DOCUSATE SODIUM 8.6-50 MG PO TABS
1.0000 | ORAL_TABLET | Freq: Every evening | ORAL | Status: DC | PRN
  Administered 2018-05-05: 1 via ORAL
  Filled 2018-05-02: qty 1

## 2018-05-02 MED ORDER — POTASSIUM CHLORIDE IN NACL 20-0.45 MEQ/L-% IV SOLN
INTRAVENOUS | Status: DC
  Administered 2018-05-02 – 2018-05-03 (×4): via INTRAVENOUS
  Filled 2018-05-02 (×5): qty 1000

## 2018-05-02 MED ORDER — CHLORHEXIDINE GLUCONATE 4 % EX LIQD
60.0000 mL | Freq: Once | CUTANEOUS | Status: AC
  Administered 2018-05-02: 4 via TOPICAL

## 2018-05-02 MED ORDER — ONDANSETRON HCL 4 MG/2ML IJ SOLN: 4.0000 mg | Freq: Once | INTRAMUSCULAR | Status: DC | PRN

## 2018-05-02 MED ORDER — ACETAMINOPHEN 500 MG PO TABS: 1000.0000 mg | ORAL_TABLET | Freq: Three times a day (TID) | ORAL | Status: DC | PRN

## 2018-05-02 MED ORDER — VANCOMYCIN HCL 1000 MG IV SOLR
INTRAVENOUS | Status: AC
  Filled 2018-05-02: qty 1000

## 2018-05-02 MED ORDER — FENTANYL CITRATE (PF) 250 MCG/5ML IJ SOLN
INTRAMUSCULAR | Status: DC | PRN
  Administered 2018-05-02: 50 ug via INTRAVENOUS

## 2018-05-02 MED ORDER — 0.9 % SODIUM CHLORIDE (POUR BTL) OPTIME
TOPICAL | Status: DC | PRN
  Administered 2018-05-02: 1000 mL

## 2018-05-02 MED ORDER — LIDOCAINE 2% (20 MG/ML) 5 ML SYRINGE
INTRAMUSCULAR | Status: AC
  Filled 2018-05-02: qty 20

## 2018-05-02 MED ORDER — PROPOFOL 10 MG/ML IV BOLUS
INTRAVENOUS | Status: DC | PRN
  Administered 2018-05-02: 70 mg via INTRAVENOUS

## 2018-05-02 MED ORDER — ROCURONIUM BROMIDE 10 MG/ML (PF) SYRINGE
PREFILLED_SYRINGE | INTRAVENOUS | Status: DC | PRN
  Administered 2018-05-02: 50 mg via INTRAVENOUS

## 2018-05-02 MED ORDER — GLYCOPYRROLATE PF 0.2 MG/ML IJ SOSY
PREFILLED_SYRINGE | INTRAMUSCULAR | Status: AC
  Filled 2018-05-02: qty 1

## 2018-05-02 MED ORDER — GLYCOPYRROLATE 0.2 MG/ML IJ SOLN
INTRAMUSCULAR | Status: DC | PRN
  Administered 2018-05-02: 0.2 mg via INTRAVENOUS

## 2018-05-02 MED ORDER — ONDANSETRON HCL 4 MG/2ML IJ SOLN
4.0000 mg | Freq: Four times a day (QID) | INTRAMUSCULAR | Status: DC | PRN
  Administered 2018-05-02: 4 mg via INTRAVENOUS

## 2018-05-02 MED ORDER — SODIUM CHLORIDE 0.9% FLUSH
3.0000 mL | Freq: Two times a day (BID) | INTRAVENOUS | Status: DC
  Administered 2018-05-02 – 2018-05-06 (×6): 3 mL via INTRAVENOUS

## 2018-05-02 MED ORDER — LIDOCAINE 2% (20 MG/ML) 5 ML SYRINGE
INTRAMUSCULAR | Status: DC | PRN
  Administered 2018-05-02: 40 mg via INTRAVENOUS

## 2018-05-02 MED ORDER — POVIDONE-IODINE 10 % EX SWAB: 2.0000 "application " | Freq: Once | CUTANEOUS | Status: DC

## 2018-05-02 MED ORDER — SUGAMMADEX SODIUM 200 MG/2ML IV SOLN
INTRAVENOUS | Status: DC | PRN
  Administered 2018-05-02: 200 mg via INTRAVENOUS

## 2018-05-02 MED ORDER — CALCIUM CARBONATE 1250 (500 CA) MG PO TABS
1.0000 | ORAL_TABLET | Freq: Every day | ORAL | Status: DC
  Administered 2018-05-02 – 2018-05-06 (×5): 500 mg via ORAL
  Filled 2018-05-02 (×5): qty 1

## 2018-05-02 MED ORDER — ACETAMINOPHEN 325 MG PO TABS
650.0000 mg | ORAL_TABLET | Freq: Four times a day (QID) | ORAL | Status: DC
  Administered 2018-05-02 – 2018-05-06 (×13): 650 mg via ORAL
  Filled 2018-05-02 (×13): qty 2

## 2018-05-02 MED ORDER — HEPARIN SODIUM (PORCINE) 5000 UNIT/ML IJ SOLN
5000.0000 [IU] | Freq: Three times a day (TID) | INTRAMUSCULAR | Status: DC
  Administered 2018-05-02 – 2018-05-03 (×5): 5000 [IU] via SUBCUTANEOUS
  Filled 2018-05-02 (×5): qty 1

## 2018-05-02 MED ORDER — SODIUM CHLORIDE 0.9 % IV SOLN
INTRAVENOUS | Status: DC | PRN
  Administered 2018-05-02: 75 ug/min via INTRAVENOUS

## 2018-05-02 MED ORDER — DEXAMETHASONE SODIUM PHOSPHATE 10 MG/ML IJ SOLN
INTRAMUSCULAR | Status: AC
  Filled 2018-05-02: qty 2

## 2018-05-02 SURGICAL SUPPLY — 82 items
BANDAGE ACE 4X5 VEL STRL LF (GAUZE/BANDAGES/DRESSINGS) ×2 IMPLANT
BIT DRILL 2.5X2.75 QC CALB (BIT) ×2 IMPLANT
BIT DRILL 3.5X5.5 QC CALB (BIT) ×2 IMPLANT
BIT DRILL CALIBRATED 2.7 (BIT) ×1 IMPLANT
BIT DRILL CALIBRATED 2.7MM (BIT) ×1
BLADE AVERAGE 25MMX9MM (BLADE)
BLADE AVERAGE 25X9 (BLADE) IMPLANT
BNDG ESMARK 4X9 LF (GAUZE/BANDAGES/DRESSINGS) ×3 IMPLANT
BNDG GAUZE ELAST 4 BULKY (GAUZE/BANDAGES/DRESSINGS) ×6 IMPLANT
BRUSH SCRUB SURG 4.25 DISP (MISCELLANEOUS) ×6 IMPLANT
CLOSURE STERI-STRIP 1/2X4 (GAUZE/BANDAGES/DRESSINGS) ×1
CLSR STERI-STRIP ANTIMIC 1/2X4 (GAUZE/BANDAGES/DRESSINGS) ×1 IMPLANT
CORDS BIPOLAR (ELECTRODE) ×3 IMPLANT
COVER SURGICAL LIGHT HANDLE (MISCELLANEOUS) ×6 IMPLANT
COVER WAND RF STERILE (DRAPES) ×3 IMPLANT
DIS HUM POSTLAT RT 17H 147MM (Plate) ×3 IMPLANT
DRAIN PENROSE 1/4X12 LTX STRL (WOUND CARE) IMPLANT
DRAPE C-ARM 42X72 X-RAY (DRAPES) ×3 IMPLANT
DRAPE C-ARMOR (DRAPES) IMPLANT
DRAPE HALF SHEET 40X57 (DRAPES) ×3 IMPLANT
DRAPE INCISE IOBAN 66X45 STRL (DRAPES) IMPLANT
DRAPE ORTHO SPLIT 77X108 STRL (DRAPES)
DRAPE SURG ORHT 6 SPLT 77X108 (DRAPES) IMPLANT
DRAPE U-SHAPE 47X51 STRL (DRAPES) ×6 IMPLANT
DRSG ADAPTIC 3X8 NADH LF (GAUZE/BANDAGES/DRESSINGS) ×3 IMPLANT
DRSG AQUACEL AG ADV 3.5X10 (GAUZE/BANDAGES/DRESSINGS) ×2 IMPLANT
DRSG PAD ABDOMINAL 8X10 ST (GAUZE/BANDAGES/DRESSINGS) ×3 IMPLANT
ELECT REM PT RETURN 9FT ADLT (ELECTROSURGICAL) ×3
ELECTRODE REM PT RTRN 9FT ADLT (ELECTROSURGICAL) ×1 IMPLANT
EVACUATOR 1/8 PVC DRAIN (DRAIN) IMPLANT
GAUZE SPONGE 4X4 12PLY STRL (GAUZE/BANDAGES/DRESSINGS) ×6 IMPLANT
GLOVE BIO SURGEON STRL SZ 6.5 (GLOVE) ×6 IMPLANT
GLOVE BIO SURGEON STRL SZ7.5 (GLOVE) ×3 IMPLANT
GLOVE BIO SURGEONS STRL SZ 6.5 (GLOVE) ×3
GLOVE BIOGEL PI IND STRL 6.5 (GLOVE) ×1 IMPLANT
GLOVE BIOGEL PI IND STRL 7.5 (GLOVE) ×1 IMPLANT
GLOVE BIOGEL PI IND STRL 8 (GLOVE) ×1 IMPLANT
GLOVE BIOGEL PI INDICATOR 6.5 (GLOVE) ×2
GLOVE BIOGEL PI INDICATOR 7.5 (GLOVE) ×2
GLOVE BIOGEL PI INDICATOR 8 (GLOVE) ×2
GOWN STRL REUS W/ TWL LRG LVL3 (GOWN DISPOSABLE) ×2 IMPLANT
GOWN STRL REUS W/ TWL XL LVL3 (GOWN DISPOSABLE) ×1 IMPLANT
GOWN STRL REUS W/TWL LRG LVL3 (GOWN DISPOSABLE) ×4
GOWN STRL REUS W/TWL XL LVL3 (GOWN DISPOSABLE) ×2
KIT BASIN OR (CUSTOM PROCEDURE TRAY) ×3 IMPLANT
KIT TURNOVER KIT B (KITS) ×3 IMPLANT
MANIFOLD NEPTUNE II (INSTRUMENTS) ×3 IMPLANT
NDL HYPO 25X1 1.5 SAFETY (NEEDLE) ×1 IMPLANT
NEEDLE HYPO 25X1 1.5 SAFETY (NEEDLE) ×3 IMPLANT
NS IRRIG 1000ML POUR BTL (IV SOLUTION) ×3 IMPLANT
PACK ORTHO EXTREMITY (CUSTOM PROCEDURE TRAY) ×3 IMPLANT
PAD ABD 8X10 STRL (GAUZE/BANDAGES/DRESSINGS) ×2 IMPLANT
PAD ARMBOARD 7.5X6 YLW CONV (MISCELLANEOUS) ×6 IMPLANT
SCREW CORT 3.5X26 (Screw) ×2 IMPLANT
SCREW CORT T15 26X3.5XST LCK (Screw) IMPLANT
SCREW CORTICAL 3.5MM 24MM (Screw) ×2 IMPLANT
SCREW LOCK CORT STAR 3.5X16 (Screw) ×4 IMPLANT
SCREW LOCK CORT STAR 3.5X18 (Screw) ×2 IMPLANT
SCREW LOCK CORT STAR 3.5X20 (Screw) ×2 IMPLANT
SCREW LOW PROFILE 18MMX3.5MM (Screw) ×2 IMPLANT
SCREW LP NL T15 3.5X20 (Screw) ×2 IMPLANT
SCREW NON LOCKING LP 3.5 14MM (Screw) ×2 IMPLANT
SCREW NON LOCKING LP 3.5 16MM (Screw) ×2 IMPLANT
SPONGE LAP 18X18 X RAY DECT (DISPOSABLE) IMPLANT
STAPLER VISISTAT 35W (STAPLE) ×3 IMPLANT
STEM DIS HUM POSTLT RT 17H 147 (Plate) IMPLANT
STOCKINETTE IMPERVIOUS 9X36 MD (GAUZE/BANDAGES/DRESSINGS) IMPLANT
SUCTION FRAZIER HANDLE 10FR (MISCELLANEOUS) ×2
SUCTION TUBE FRAZIER 10FR DISP (MISCELLANEOUS) ×1 IMPLANT
SUT ETHILON 3 0 PS 1 (SUTURE) ×6 IMPLANT
SUT VIC AB 0 CT1 27 (SUTURE) ×4
SUT VIC AB 0 CT1 27XBRD ANBCTR (SUTURE) ×2 IMPLANT
SUT VIC AB 2-0 CT1 27 (SUTURE) ×4
SUT VIC AB 2-0 CT1 TAPERPNT 27 (SUTURE) ×2 IMPLANT
SYR 5ML LL (SYRINGE) IMPLANT
SYR CONTROL 10ML LL (SYRINGE) ×3 IMPLANT
TOWEL OR 17X24 6PK STRL BLUE (TOWEL DISPOSABLE) ×3 IMPLANT
TOWEL OR 17X26 10 PK STRL BLUE (TOWEL DISPOSABLE) ×9 IMPLANT
TRAY FOLEY MTR SLVR 16FR STAT (SET/KITS/TRAYS/PACK) IMPLANT
WASHER 3.5MM (Orthopedic Implant) ×8 IMPLANT
WATER STERILE IRR 1000ML POUR (IV SOLUTION) ×3 IMPLANT
YANKAUER SUCT BULB TIP NO VENT (SUCTIONS) IMPLANT

## 2018-05-02 NOTE — Evaluation (Signed)
Occupational Therapy Evaluation: Late Entry from this morning PRE-OP Patient Details Name: Michele Lambert MRN: [id number] DOB: [birth year] Today's Date: 05/02/2018    History of Present Illness Michele Lambert is a 85 y.o. female with medical history significant for Hypothyroidism and Dementia who presents to the ED after an unwitnessed fall at home. Radiographs revealed a comminuted right supracondylar elbow fracture.    Clinical Impression   PTA Pt supervision for ADL (bathing/dressing) getting around without DME but family has 24 hour assist due to dementia. Pt is currently min guard assist for transfers, mod A for ADL due to immobilization of RUE (dominant hand). She is cooperative, eager to work with therapy. Pt will require skilled OT in the acute setting and afterwards at the [facility] level to maximize safety and independence in ADL and mobility and carryout HEP for RUE as order by MD post-op. Next session to focus on HEP, assess vision, and educate caregivers for sling maintenance etc.    Follow Up Recommendations  Home health OT;Supervision/Assistance - 24 hour    Equipment Recommendations  None recommended by OT(Pt has appopriate DME at home)    Recommendations for Other Services       Precautions / Restrictions Precautions Precautions: Fall Required Braces or Orthoses: Sling Restrictions Weight Bearing Restrictions: Yes RUE Weight Bearing: Non weight bearing      Mobility Bed Mobility Overal bed mobility: Needs Assistance Bed Mobility: Supine to Sit     Supine to sit: Min assist     General bed mobility comments: cues for safety, min A for hips EOB with pad  Transfers Overall transfer level: Needs assistance Equipment used: 1 person hand held assist Transfers: Sit to/from Stand Sit to Stand: Min guard;Min assist         General transfer comment: assist for balance/steady initially (has not been OOB)    Balance Overall balance assessment: Mild deficits observed,  not formally tested                                         ADL either performed or assessed with clinical judgement   ADL Overall ADL's : Needs assistance/impaired Eating/Feeding: Moderate assistance   Grooming: Moderate assistance   Upper Body Bathing: Moderate assistance   Lower Body Bathing: Moderate assistance;Sitting/lateral leans   Upper Body Dressing : Moderate assistance   Lower Body Dressing: Moderate assistance;Sit to/from stand   Toilet Transfer: Minimal assistance;Ambulation   Toileting- Clothing Manipulation and Hygiene: Min guard;Sitting/lateral lean   Tub/ Shower Transfer: Moderate assistance;With caregiver independent assisting;Ambulation;Shower seat   Functional mobility during ADLs: Min guard;Minimal assistance General ADL Comments: Pt is right hand dominant, she will require additional assist to manage BUE tasks, and increased assist for ADL     Vision Baseline Vision/History: Wears glasses Wears Glasses: At all times Patient Visual Report: No change from baseline Additional Comments: please test next session     Perception     Praxis      Pertinent Vitals/Pain Pain Assessment: Faces Faces Pain Scale: Hurts a little bit Pain Location: R arm Pain Descriptors / Indicators: Discomfort Pain Intervention(s): Monitored during session;Repositioned     Hand Dominance Right   Extremity/Trunk Assessment Upper Extremity Assessment Upper Extremity Assessment: RUE deficits/detail RUE Deficits / Details: in splint RUE: Unable to fully assess due to immobilization   Lower Extremity Assessment Lower Extremity Assessment: Defer to PT evaluation  Cervical / Trunk Assessment Cervical / Trunk Assessment: Other exceptions Cervical / Trunk Exceptions: forward head, rounded shoulders   Communication Communication Communication: No difficulties   Cognition Arousal/Alertness: Awake/alert Behavior During Therapy: WFL for tasks  assessed/performed Overall Cognitive Status: History of cognitive impairments - at baseline                                 General Comments: history of dementia (high functioning)   General Comments  son present and supportive throughout    Exercises Exercises: Other exercises Other Exercises Other Exercises: digits - open and close fist   Shoulder Instructions      Home Living Family/patient expects to be discharged to:: Private residence Living Arrangements: Children;Non-relatives/Friends(patient has 24 hour caregivers in addition to family) Available Help at Discharge: Family;Personal care attendant;Available 24 hours/day Type of Home: House Home Access: Stairs to enter [employer] of Steps: 1 Entrance Stairs-Rails: None Home Layout: One level     Bathroom Shower/Tub: Teacher, early years/pre: Standard     Home Equipment: Clinical cytogeneticist - 2 wheels;Hand held shower head          Prior Functioning/Environment Level of Independence: Needs assistance  Gait / Transfers Assistance Needed: no DME for mobility ADL's / Homemaking Assistance Needed: assist/supervision for bathing/dressing - pt prefers to take baths   Comments: Son giving background. Pt is unreliable historian        OT Problem List: Decreased range of motion      OT Treatment/Interventions: Self-care/ADL training;Therapeutic exercise;Therapeutic activities;Patient/family education;Balance training    OT Goals(Current goals can be found in the care plan section) Acute Rehab OT Goals Patient Stated Goal: to return home OT Goal Formulation: With patient/family Time For Goal Achievement: 05/16/18 Potential to Achieve Goals: Good ADL Goals Pt Will Perform Upper Body Bathing: with min guard assist;with caregiver independent in assisting;sitting Pt Will Perform Upper Body Dressing: with min assist;with caregiver independent in assisting;sitting Pt Will Transfer to  Toilet: with supervision;ambulating Pt Will Perform Toileting - Clothing Manipulation and hygiene: with min guard assist;with caregiver independent in assisting;sit to/from stand Pt/caregiver will Perform Home Exercise Program: Right Upper extremity;With Supervision;With written HEP provided Additional ADL Goal #1: Pt will perform bed mobility at mod I level prior to engaging in ADL  OT Frequency: Min 3X/week   Barriers to D/C:            Co-evaluation              AM-PAC OT "6 Clicks" Daily Activity     Outcome Measure Help from another person eating meals?: A Lot Help from another person taking care of personal grooming?: A Lot Help from another person toileting, which includes using toliet, bedpan, or urinal?: A Lot Help from another person bathing (including washing, rinsing, drying)?: A Lot Help from another person to put on and taking off regular upper body clothing?: A Lot Help from another person to put on and taking off regular lower body clothing?: A Lot 6 Click Score: 12   End of Session Equipment Utilized During Treatment: Gait belt;Other (comment)(sling) Nurse Communication: Mobility status;Weight bearing status;Other (comment)(no chair alarm, son present)  Activity Tolerance: Patient tolerated treatment well Patient left: in chair;with call bell/phone within reach;with family/visitor present  OT Visit Diagnosis: History of falling (Z91.81);Pain Pain - Right/Left: Right Pain - part of body: Arm  Time: 0335-3317 OT Time Calculation (min): 12 min Charges:  OT General Charges $OT Visit: 1 Visit OT Evaluation $OT Eval Moderate Complexity: [place] OTR/L Acute Rehabilitation Services Pager: [phone] Office: [place] 05/02/2018, 4:56 PM

## 2018-05-02 NOTE — Transfer of Care (Signed)
Immediate Anesthesia Transfer of Care Note  Patient: Michele Lambert  Procedure(s) Performed: OPEN REDUCTION INTERNAL FIXATION (ORIF) DISTAL HUMERUS FRACTURE (Right Arm Upper)  Patient Location: PACU  Anesthesia Type:General  Level of Consciousness: awake, alert  and oriented  Airway & Oxygen Therapy: Patient Spontanous Breathing and Patient connected to nasal cannula oxygen  Post-op Assessment: Report given to RN and Post -op Vital signs reviewed and stable  Post vital signs: Reviewed and stable  Last Vitals:  Vitals Value Taken Time  BP 145/72 05/02/2018  3:13 PM  Temp 36.3 C 05/02/2018  3:13 PM  Pulse 72 05/02/2018  3:18 PM  Resp 12 05/02/2018  3:18 PM  SpO2 100 % 05/02/2018  3:18 PM  Vitals shown include unvalidated device data.  Last Pain:  Vitals:   05/02/18 1513  TempSrc:   PainSc: 0-No pain      Patients Stated Pain Goal: 0 (09/62/83 6629)  Complications: No apparent anesthesia complications

## 2018-05-02 NOTE — Consult Note (Signed)
Reason for Consult: Right supracondylar elbow fracture, closed Referring Physician: Fayrene Helper, MD  Michele Lambert is an 85 y.o. female.  HPI: 85 year old female with history of dementia who lives independently with around-the-clock help.  She was found in her carport after an unwitnessed fall.  She was brought to the emergency room with noted right elbow injury.  Radiographs revealed a comminuted right supracondylar elbow fracture.  Orthopedics was consulted and upon review of radiographs I instructed them to place her in a splint and sling.  She was admitted to the hospital based on her medical condition, age, in an effort to find help with open reduction internal fixation and definitive management of the elbow.  Due to her dementia it is difficult even with her son in the room to determine if there were any other injuries.  She does have a small cut/abrasion to her right forehead.  No other deformities or bruising noted.  Past Medical History:  Diagnosis Date  . Dementia ([name])   . Hypothyroidism     History reviewed. No pertinent surgical history.  Family History  Problem Relation Age of Onset  . Stroke Mother     Social History:  reports that she has never smoked. She has never used smokeless tobacco. No history on file for alcohol and drug.  Allergies:  Allergies  Allergen Reactions  . Codeine   . Lescol [Fluvastatin Sodium]     Medications:  I have reviewed the patient's current medications. Scheduled: . calcium carbonate  1 tablet Oral Daily  . heparin  5,000 Units Subcutaneous Q8H  . levothyroxine  88 mcg Oral QAC breakfast  . sodium chloride flush  3 mL Intravenous Q12H    Results for orders placed or performed during the hospital encounter of 05/01/18 (from the past 24 hour(s))  Basic metabolic panel     Status: Abnormal   Collection Time: 05/01/18  8:02 PM  Result Value Ref Range   Sodium 139 135 - 145 mmol/L   Potassium 3.8 3.5 - 5.1 mmol/L   Chloride 105  98 - 111 mmol/L   CO2 25 22 - 32 mmol/L   Glucose, Bld 125 (H) 70 - 99 mg/dL   BUN 32 (H) 8 - 23 mg/dL   Creatinine, Ser 0.99 0.44 - 1.00 mg/dL   Calcium 8.8 (L) 8.9 - 10.3 mg/dL   GFR calc non Af Amer 51 (L) >60 mL/min   GFR calc Af Amer 59 (L) >60 mL/min   Anion gap 9 5 - 15  CBC with Differential     Status: Abnormal   Collection Time: 05/01/18  8:02 PM  Result Value Ref Range   WBC 17.6 (H) 4.0 - 10.5 K/uL   RBC 4.21 3.87 - 5.11 MIL/uL   Hemoglobin 12.4 12.0 - 15.0 g/dL   HCT 38.3 36.0 - 46.0 %   MCV 91.0 80.0 - 100.0 fL   MCH 29.5 26.0 - 34.0 pg   MCHC 32.4 30.0 - 36.0 g/dL   RDW 14.6 11.5 - 15.5 %   Platelets 198 150 - 400 K/uL   nRBC 0.0 0.0 - 0.2 %   Neutrophils Relative % 89 %   Neutro Abs 15.6 (H) 1.7 - 7.7 K/uL   Lymphocytes Relative 5 %   Lymphs Abs 0.9 0.7 - 4.0 K/uL   Monocytes Relative 5 %   Monocytes Absolute 0.9 0.1 - 1.0 K/uL   Eosinophils Relative 0 %   Eosinophils Absolute 0.0 0.0 - 0.5 K/uL  Basophils Relative 0 %   Basophils Absolute 0.0 0.0 - 0.1 K/uL   Immature Granulocytes 1 %   Abs Immature Granulocytes 0.11 (H) 0.00 - 0.07 K/uL  CK     Status: Abnormal   Collection Time: 05/01/18  8:02 PM  Result Value Ref Range   Total CK 279 (H) 38 - 234 U/L  Surgical pcr screen     Status: None   Collection Time: 05/02/18  1:50 AM  Result Value Ref Range   MRSA, PCR NEGATIVE NEGATIVE   Staphylococcus aureus NEGATIVE NEGATIVE  Basic metabolic panel     Status: Abnormal   Collection Time: 05/02/18  3:30 AM  Result Value Ref Range   Sodium 140 135 - 145 mmol/L   Potassium 3.9 3.5 - 5.1 mmol/L   Chloride 107 98 - 111 mmol/L   CO2 25 22 - 32 mmol/L   Glucose, Bld 126 (H) 70 - 99 mg/dL   BUN 30 (H) 8 - 23 mg/dL   Creatinine, Ser 0.91 0.44 - 1.00 mg/dL   Calcium 8.6 (L) 8.9 - 10.3 mg/dL   GFR calc non Af Amer 56 (L) >60 mL/min   GFR calc Af Amer >60 >60 mL/min   Anion gap 8 5 - 15  CBC     Status: Abnormal   Collection Time: 05/02/18  3:30 AM  Result  Value Ref Range   WBC 11.4 (H) 4.0 - 10.5 K/uL   RBC 3.76 (L) 3.87 - 5.11 MIL/uL   Hemoglobin 10.8 (L) 12.0 - 15.0 g/dL   HCT 33.9 (L) 36.0 - 46.0 %   MCV 90.2 80.0 - 100.0 fL   MCH 28.7 26.0 - 34.0 pg   MCHC 31.9 30.0 - 36.0 g/dL   RDW 14.4 11.5 - 15.5 %   Platelets 129 (L) 150 - 400 K/uL   nRBC 0.0 0.0 - 0.2 %    X-ray:  CLINICAL DATA:  85 y/o  F; fall with deformity to the right humerus.  EXAM: RIGHT HUMERUS - 2+ VIEW; RIGHT SHOULDER - 2+ VIEW  COMPARISON:  None.  FINDINGS: Right humerus:  Acute oblique fracture of the distal right femoral diaphysis with 1 shaft's with lateral displacement of the proximal fracture component, 3.7 cm overriding, and apex anterolateral angulation. The elbow joint is maintained.  Right shoulder:  No acute fracture or dislocation the shoulder joint. Acromioclavicular and coracoclavicular distances are normal. Decreased acromial humeral distance may represent underlying rotator cuff injury.  IMPRESSION: Acute oblique overriding and displaced fracture of the right distal humeral diaphysis. No dislocation.   Electronically Signed   By: Kristine Garbe M.D.  ROS  History of dementia  Blood pressure (!) 122/59, pulse 70, temperature 98.8 F (37.1 C), temperature source Oral, resp. rate 14, weight 52 kg, SpO2 97 %.  Physical Exam  Awake and alert with her son in the room. Bandage over the right forehead with scant bleeding Right upper extremity in splint and sling.  Some swelling of her fingers.  Intact sensibility of her fingers.  Demonstrates movement of her hand at this point.  General medical exam reviewed for pertinent findings.  No other deformities of the left upper extremity or lower extremities identified.  Assessment/Plan: Assessment: Closed right comminuted supracondylar humerus fracture  Plan: At this point we will keep her n.p.o.  I will need to find someone with expertise in management of  supracondylar humerus fractures to take over care.  We will contact nursing based on timing and availability of  said help. Maintain pain control Maintain ice to right elbow and right splint and sling.  [name] 05/02/2018, 7:15 AM

## 2018-05-02 NOTE — Anesthesia Procedure Notes (Signed)
Anesthesia Regional Block: Interscalene brachial plexus block   Pre-Anesthetic Checklist: ,, timeout performed, Correct Patient, Correct Site, Correct Laterality, Correct Procedure, Correct Position, site marked, Risks and benefits discussed,  Surgical consent,  Pre-op evaluation,  At surgeon's request and post-op pain management  Laterality: Right  Prep: chloraprep       Needles:  Injection technique: Single-shot  Needle Type: Echogenic Stimulator Needle     Needle Length: 9cm  Needle Gauge: 21     Additional Needles:   Procedures:,,,, ultrasound used (permanent image in chart),,,,  Narrative:  Start time: 05/02/2018 12:50 PM End time: 05/02/2018 12:54 PM Injection made incrementally with aspirations every 5 mL.  Performed by: Personally  Anesthesiologist: Lidia Collum, MD  Additional Notes: Monitors applied. Injection made in 5cc increments. No resistance to injection. Good needle visualization. Patient tolerated procedure well.

## 2018-05-02 NOTE — Anesthesia Postprocedure Evaluation (Signed)
Anesthesia Post Note  Patient: WILLETTE MUDRY  Procedure(s) Performed: OPEN REDUCTION INTERNAL FIXATION (ORIF) DISTAL HUMERUS FRACTURE (Right Arm Upper)     Patient location during evaluation: PACU Anesthesia Type: General Level of consciousness: responds to stimulation (sleeping but arousable, at baseline mental status) Pain management: pain level controlled Vital Signs Assessment: post-procedure vital signs reviewed and stable Respiratory status: spontaneous breathing, nonlabored ventilation and respiratory function stable Cardiovascular status: blood pressure returned to baseline and stable Postop Assessment: no apparent nausea or vomiting Anesthetic complications: no    Last Vitals:  Vitals:   05/02/18 1600 05/02/18 1605  BP: (!) 157/85 (!) 164/84  Pulse: 71 73  Resp: 16 20  Temp:  (!) 36.4 C  SpO2: 99% 94%    Last Pain:  Vitals:   05/02/18 1513  TempSrc:   PainSc: 0-No pain                 Lidia Collum

## 2018-05-02 NOTE — Plan of Care (Signed)

## 2018-05-02 NOTE — Progress Notes (Signed)
**Note De-Identified vi Obfusction** PROGRESS NOTE    Michele Lambert  CWC:[id number] DOB: 1927-04-01 [name]: 05/01/2018 PCP: Dion Body, MD   Brief Nrrtive:  Michele Lambert is  84 y.o. femle with medicl history significnt for Hypothyroidism nd Dementi who presents to the ED fter n unwitnessed fll t home.  History is limited from ptient due to underlying dementi, nd therefore mjority of history is obtined from fmily t bedside nd chrt review.  Ptient pprently hd n unwitnessed fll in her grge sometime between 2-4 PM on 05/01/2018.  She ws t her bseline when her home cregiver left the house round 2 PM.  Ptient's grndson went to see her round 4 PM nd found her lying on the ground of the grge with her right rm extended out nd bove her hed in n odd ngle.    Ptient herself currently denies ny chest pin, plpittions, lighthededness, dizziness, nuse, vomiting, dyspne, bdominl pin, dirrhe, constiption, dysuri.  She reports some pin in her right rm nd right eyebrow.  Per fmily, t bseline ptient is firly high functioning nd mbultes without ssist but does hve underlying dementi with occsionl episodes of confusion nd difficulty with recll  Assessment & Pln:   Principl Problem:   Displced frcture of right humerus Active Problems:   Hypothyroidism   Dementi ([place])   Unwitnessed fll   Acute oblique overriding nd displced frcture of the right distl humerus: Occurred fter unwitnessed fll.  Ptient plced in long-rm splint which is wrpped nd plced in  sling for downwrd trction.  -To OR with orthopedics tody -Pin control with s needed Tylenol, OxyIR, cn use low-dose IV morphine if needed -Hed of bed 30 degrees -RCRI is 0, discussed risk/benefits with fmily t bedside  Unwitnessed fll: Ptient does not recll ny of the conditions surrounding the event.  She is reportedly freely mobile t bseline without the need to use ssistive  devices. -Monitor on telemetry -PT/OT evl fter surgery -Fll precutions -Gentle IV fluids overnight  Hypothyroidism: Lst TSH 2.972, free T4 1.06 on 03/30/2018. -Continue Synthroid  Dementi: -Delirium precutions  Urinry Retention - foley ctheter plced, will   DVT prophylxis: heprin Code Sttus: full code, discussed with pt son nd medicl power of ttorney Ken Limon who is  medicl doctor.  He clrified her code sttus tody, clerly stting tht she'd prefer to be full code in this sitution Fmily Communiction: son, dughter in [place], son over phone Disposition Pln: pending surgery, dditionl evlution  Consultnts:   orthopedics  Procedures:   none  Antimicrobils:  Anti-infectives (From dmission, onwrd)   None      Subjective: Pin is ok. She's confused. Sid her DIL ws  friend nd her son ws her brother. Dughter in lw nd son t bedside.   Objective: Vitls:   05/02/18 0015 05/02/18 0045 05/02/18 0100 05/02/18 0504  BP: 132/78 (!) 143/77  (!) 122/59  Pulse: 72 79  70  Resp:  14  14  Temp:  98.6 F (37 C)  98.8 F (37.1 C)  TempSrc:  Orl  Orl  SpO2: 96% 98%  97%  Weight:   52 kg    No intke or output dt in the 24 hours ending 05/02/18 1201 Filed Weights   05/02/18 0100  Weight: 52 kg    Exmintion:  Generl exm: Appers clm nd comfortble  Bruising to R forehed Respirtory system: Cler to usculttion. Respirtory effort norml. Crdiovsculr system: S1 & S2 herd, RRR.  Gstrointestinl system: Abdomen is nondistended, soft **Note De-Identified vi Obfusction** nd nontender Centrl nervous system: Alert, but confused. No focl neurologicl deficits. Extremities: R rm in sling Skin: No rshes, lesions or ulcers Psychitry: Judgement nd insight pper norml. Mood & ffect pproprite.     Dt Reviewed: I hve personlly reviewed following lbs nd imging studies  CBC: Recent Lbs  Lb 05/01/18 2002 05/02/18 0330  WBC 17.6* 11.4*   NEUTROABS 15.6*  --   HGB 12.4 10.8*  HCT 38.3 33.9*  MCV 91.0 90.2  PLT 198 016*   Bsic Metbolic Pnel: Recent Lbs  Lb 05/01/18 2002 05/02/18 0330  NA 139 140  K 3.8 3.9  CL 105 107  CO2 25 25  GLUCOSE 125* 126*  BUN 32* 30*  CREATININE 0.99 0.91  CALCIUM 8.8* 8.6*   GFR: Estimted Cretinine Clernce: 34.4 mL/min (by [employer] formul bsed on SCr of 0.91 mg/dL). Liver Function Tests: No results for input(s): AST, ALT, ALKPHOS, BILITOT, PROT, ALBUMIN in the lst 168 hours. No results for input(s): LIPASE, AMYLASE in the lst 168 hours. No results for input(s): AMMONIA in the lst 168 hours. Cogultion Profile: No results for input(s): INR, PROTIME in the lst 168 hours. Crdic Enzymes: Recent Lbs  Lb 05/01/18 2002  CKTOTAL 279*   BNP (lst 3 results) No results for input(s): PROBNP in the lst 8760 hours. HbA1C: No results for input(s): HGBA1C in the lst 72 hours. CBG: No results for input(s): GLUCAP in the lst 168 hours. Lipid Profile: No results for input(s): CHOL, HDL, LDLCALC, TRIG, CHOLHDL, LDLDIRECT in the lst 72 hours. Thyroid Function Tests: No results for input(s): TSH, T4TOTAL, FREET4, T3FREE, THYROIDAB in the lst 72 hours. Anemi Pnel: No results for input(s): VITAMINB12, FOLATE, FERRITIN, TIBC, IRON, RETICCTPCT in the lst 72 hours. Sepsis Lbs: No results for input(s): PROCALCITON, LATICACIDVEN in the lst 168 hours.  Recent Results (from the pst 240 hour(s))  Surgicl pcr screen     Sttus: None   Collection Time: 05/02/18  1:50 AM  Result Vlue Ref Rnge Sttus   MRSA, PCR NEGATIVE NEGATIVE Finl   Stphylococcus ureus NEGATIVE NEGATIVE Finl    Comment: (NOTE) The Xpert SA Assy (FDA pproved for NASAL specimens in ptients 55 yers of ge nd older), is one component of  comprehensive surveillnce progrm. It is not intended to dignose infection nor to guide or monitor tretment. Performed t [facility], [place]  [address] [place], [place] [address]          Rdiology Studies: Dg Shoulder Right  Result Dte: 05/01/2018 CLINICAL DATA:  85 y/o  F; fll with deformity to the right humerus. EXAM: RIGHT HUMERUS - 2+ VIEW; RIGHT SHOULDER - 2+ VIEW COMPARISON:  None. FINDINGS: Right humerus: Acute oblique frcture of the distl right femorl diphysis with 1 shft's with lterl displcement of the proximl frcture component, 3.7 cm overriding, nd pex nterolterl ngultion. The elbow joint is mintined. Right shoulder: No cute frcture or disloction the shoulder joint. Acromioclviculr nd corcoclviculr distnces re norml. Decresed cromil humerl distnce my represent underlying rottor cuff injury. IMPRESSION: Acute oblique overriding nd displced frcture of the right distl humerl diphysis. No disloction. Electroniclly Signed   By: Kristine Grbe M.D.   On: 05/01/2018 22:05   Ct Hed Wo Contrst  Result Dte: 05/01/2018 CLINICAL DATA:  56 yer old femle with hed, fce nd neck injury following fll tody. Initil encounter. EXAM: CT HEAD WITHOUT CONTRAST CT MAXILLOFACIAL WITHOUT CONTRAST CT CERVICAL SPINE WITHOUT CONTRAST TECHNIQUE: Multidetector CT imging of the hed, cervicl spine,  and maxillofacial structures were performed using the standard protocol without intravenous contrast. Multiplanar CT image reconstructions of the cervical spine and maxillofacial structures were also generated. COMPARISON:  11/21/2017 CTs FINDINGS: CT HEAD FINDINGS Brain: No evidence of acute infarction, hemorrhage, hydrocephalus, extra-axial collection or mass lesion/mass effect. Atrophy and chronic small-vessel white matter ischemic changes again noted. Vascular: Carotid atherosclerotic calcifications again noted. Skull: Normal. Negative for fracture or focal lesion. Other: None. CT MAXILLOFACIAL FINDINGS Osseous: No acute fracture or dislocation. No suspicious focal bony abnormalities noted. Orbits:  Negative. No traumatic or inflammatory finding. Sinuses: Clear. Soft tissues: Negative. CT CERVICAL SPINE FINDINGS Alignment: No acute subluxation. Skull base and vertebrae: No acute fracture. No primary bone lesion or focal pathologic process. Soft tissues and spinal canal: No prevertebral fluid or swelling. No visible canal hematoma. Disc levels: Mild to moderate multilevel degenerative disc disease/spondylosis again noted. Upper chest: No acute abnormality. Other: None IMPRESSION: 1. No evidence of acute intracranial abnormality. Atrophy and chronic small-vessel white matter ischemic changes. 2. No evidence of acute facial injury. 3. No static evidence of acute injury to the cervical spine. Unchanged mild to moderate multilevel degenerative disc disease/spondylosis. Electronically Signed   By: Margarette Canada M.D.   On: 05/01/2018 21:57   Ct Cervical Spine Wo Contrast  Result Date: 05/01/2018 CLINICAL DATA:  85 year old female with head, face and neck injury following fall today. Initial encounter. EXAM: CT HEAD WITHOUT CONTRAST CT MAXILLOFACIAL WITHOUT CONTRAST CT CERVICAL SPINE WITHOUT CONTRAST TECHNIQUE: Multidetector CT imaging of the head, cervical spine, and maxillofacial structures were performed using the standard protocol without intravenous contrast. Multiplanar CT image reconstructions of the cervical spine and maxillofacial structures were also generated. COMPARISON:  11/21/2017 CTs FINDINGS: CT HEAD FINDINGS Brain: No evidence of acute infarction, hemorrhage, hydrocephalus, extra-axial collection or mass lesion/mass effect. Atrophy and chronic small-vessel white matter ischemic changes again noted. Vascular: Carotid atherosclerotic calcifications again noted. Skull: Normal. Negative for fracture or focal lesion. Other: None. CT MAXILLOFACIAL FINDINGS Osseous: No acute fracture or dislocation. No suspicious focal bony abnormalities noted. Orbits: Negative. No traumatic or inflammatory finding.  Sinuses: Clear. Soft tissues: Negative. CT CERVICAL SPINE FINDINGS Alignment: No acute subluxation. Skull base and vertebrae: No acute fracture. No primary bone lesion or focal pathologic process. Soft tissues and spinal canal: No prevertebral fluid or swelling. No visible canal hematoma. Disc levels: Mild to moderate multilevel degenerative disc disease/spondylosis again noted. Upper chest: No acute abnormality. Other: None IMPRESSION: 1. No evidence of acute intracranial abnormality. Atrophy and chronic small-vessel white matter ischemic changes. 2. No evidence of acute facial injury. 3. No static evidence of acute injury to the cervical spine. Unchanged mild to moderate multilevel degenerative disc disease/spondylosis. Electronically Signed   By: Margarette Canada M.D.   On: 05/01/2018 21:57   Dg Chest Port 1 View  Result Date: 05/01/2018 CLINICAL DATA:  Pain. Found down. EXAM: PORTABLE CHEST 1 VIEW COMPARISON:  None. FINDINGS: Low lung volumes.The cardiomediastinal contours are normal. Atherosclerosis of the thoracic aorta. Pulmonary vasculature is normal. Skin fold projects over the left chest. No consolidation, pleural effusion, or pneumothorax. No acute osseous abnormalities are seen. IMPRESSION: Low lung volumes without acute abnormality. Electronically Signed   By: Keith Rake M.D.   On: 05/01/2018 22:18   Dg Humerus Right  Result Date: 05/01/2018 CLINICAL DATA:  85 y/o  F; fall with deformity to the right humerus. EXAM: RIGHT HUMERUS - 2+ VIEW; RIGHT SHOULDER - 2+ VIEW COMPARISON:  None. FINDINGS: Right humerus: Acute oblique  fracture of the distal right femoral diaphysis with 1 shaft's with lateral displacement of the proximal fracture component, 3.7 cm overriding, and apex anterolateral angulation. The elbow joint is maintained. Right shoulder: No acute fracture or dislocation the shoulder joint. Acromioclavicular and coracoclavicular distances are normal. Decreased acromial humeral distance may  represent underlying rotator cuff injury. IMPRESSION: Acute oblique overriding and displaced fracture of the right distal humeral diaphysis. No dislocation. Electronically Signed   By: Kristine Garbe M.D.   On: 05/01/2018 22:05   Ct Maxillofacial Wo Cm  Result Date: 05/01/2018 CLINICAL DATA:  85 year old female with head, face and neck injury following fall today. Initial encounter. EXAM: CT HEAD WITHOUT CONTRAST CT MAXILLOFACIAL WITHOUT CONTRAST CT CERVICAL SPINE WITHOUT CONTRAST TECHNIQUE: Multidetector CT imaging of the head, cervical spine, and maxillofacial structures were performed using the standard protocol without intravenous contrast. Multiplanar CT image reconstructions of the cervical spine and maxillofacial structures were also generated. COMPARISON:  11/21/2017 CTs FINDINGS: CT HEAD FINDINGS Brain: No evidence of acute infarction, hemorrhage, hydrocephalus, extra-axial collection or mass lesion/mass effect. Atrophy and chronic small-vessel white matter ischemic changes again noted. Vascular: Carotid atherosclerotic calcifications again noted. Skull: Normal. Negative for fracture or focal lesion. Other: None. CT MAXILLOFACIAL FINDINGS Osseous: No acute fracture or dislocation. No suspicious focal bony abnormalities noted. Orbits: Negative. No traumatic or inflammatory finding. Sinuses: Clear. Soft tissues: Negative. CT CERVICAL SPINE FINDINGS Alignment: No acute subluxation. Skull base and vertebrae: No acute fracture. No primary bone lesion or focal pathologic process. Soft tissues and spinal canal: No prevertebral fluid or swelling. No visible canal hematoma. Disc levels: Mild to moderate multilevel degenerative disc disease/spondylosis again noted. Upper chest: No acute abnormality. Other: None IMPRESSION: 1. No evidence of acute intracranial abnormality. Atrophy and chronic small-vessel white matter ischemic changes. 2. No evidence of acute facial injury. 3. No static evidence of acute  injury to the cervical spine. Unchanged mild to moderate multilevel degenerative disc disease/spondylosis. Electronically Signed   By: Margarette Canada M.D.   On: 05/01/2018 21:57        Scheduled Meds: . [MAR Hold] calcium carbonate  1 tablet Oral Daily  . chlorhexidine  60 mL Topical Once  . [MAR Hold] heparin  5,000 Units Subcutaneous Q8H  . [MAR Hold] levothyroxine  88 mcg Oral QAC breakfast  . povidone-iodine  2 application Topical Once  . [MAR Hold] sodium chloride flush  3 mL Intravenous Q12H   Continuous Infusions: . 0.45 % NaCl with KCl 20 mEq / L 75 mL/hr at 05/02/18 0912     LOS: 0 days    Time spent: over 30 min    Fayrene Helper, MD [facility]   If 7PM-7AM, please contact night-coverage [email or web address] Password [email or web address] 05/02/2018, 12:01 PM

## 2018-05-02 NOTE — Anesthesia Preprocedure Evaluation (Signed)
Anesthesia Evaluation  Patient identified by MRN, date of birth, ID band Patient confused    Reviewed: Allergy & Precautions, NPO status , Patient's Chart, lab work & pertinent test results  History of Anesthesia Complications Negative for: history of anesthetic complications  Airway Mallampati: II  TM Distance: >3 FB Neck ROM: Full    Dental no notable dental hx. (+) Teeth Intact   Pulmonary neg pulmonary ROS,    Pulmonary exam normal        Cardiovascular negative cardio ROS Normal cardiovascular exam     Neuro/Psych PSYCHIATRIC DISORDERS Dementia negative neurological ROS     GI/Hepatic negative GI ROS, Neg liver ROS,   Endo/Other  Hypothyroidism   Renal/GU negative Renal ROS  negative genitourinary   Musculoskeletal negative musculoskeletal ROS (+)   Abdominal   Peds  Hematology negative hematology ROS (+)   Anesthesia Other Findings   Reproductive/Obstetrics                             Anesthesia Physical Anesthesia Plan  ASA: II  Anesthesia Plan: General   Post-op Pain Management: GA combined w/ Regional for post-op pain   Induction: Intravenous  PONV Risk Score and Plan: 3 and Ondansetron, Dexamethasone and Treatment may vary due to age or medical condition  Airway Management Planned: Oral ETT  Additional Equipment: None  Intra-op Plan:   Post-operative Plan: Extubation in OR  Informed Consent: I have reviewed the patients History and Physical, chart, labs and discussed the procedure including the risks, benefits and alternatives for the proposed anesthesia with the patient or authorized representative who has indicated his/her understanding and acceptance.     Dental advisory given and Consent reviewed with POA  Plan Discussed with:   Anesthesia Plan Comments:         Anesthesia Quick Evaluation

## 2018-05-02 NOTE — Progress Notes (Signed)
Physical Therapy Evaluation Patient Details Name: Michele Lambert MRN: [id number] DOB: 03/06/1927 Today's Date: 05/02/2018   History of Present Illness  Michele Lambert is a 85 y.o. female with medical history significant for Hypothyroidism and Dementia who presents to the ED after an unwitnessed fall at home. Radiographs revealed a comminuted right supracondylar elbow fracture.   Clinical Impression  Patient received in bed, pleasant, confused, but at baseline, per son who is present for evaluation. Patient agrees to PT eval, requires min assist with supine to sit due to right UE injury. R UE in sling. Son reports patient is going for surgery at 1pm today. Patient demonstrates ambulation for 20 feet without AD, min guard assist. Patient will need follow up assessment after surgery tomorrow to ensure that she will be safe to return home with assistance and HHPT at discharge.        Follow Up Recommendations Home health PT    Equipment Recommendations  None recommended by PT    Recommendations for Other Services       Precautions / Restrictions Precautions Precautions: Fall Required Braces or Orthoses: Sling Restrictions Weight Bearing Restrictions: Yes RUE Weight Bearing: Non weight bearing      Mobility  Bed Mobility Overal bed mobility: Modified Independent;Needs Assistance Bed Mobility: Supine to Sit     Supine to sit: Min guard;Min assist     General bed mobility comments: cues to not use right UE.   Transfers Overall transfer level: Modified independent Equipment used: None                Ambulation/Gait Ambulation/Gait assistance: Min guard Gait Distance (Feet): 20 Feet Assistive device: None Gait Pattern/deviations: Step-through pattern     General Gait Details: no LOB or difficulty noted  Stairs            Wheelchair Mobility    Modified Rankin (Stroke Patients Only)       Balance Overall balance assessment: Mild deficits observed, not  formally tested                                           Pertinent Vitals/Pain Pain Assessment: Faces Faces Pain Scale: Hurts a little bit Pain Location: R arm Pain Descriptors / Indicators: Discomfort Pain Intervention(s): Limited activity within patient's tolerance;Monitored during session;Repositioned    Home Living Family/patient expects to be discharged to:: Private residence Living Arrangements: Non-relatives/Friends;Other (Comment)(patient has caregivers almost 24/7) Available Help at Discharge: Family;Personal care attendant;Available 24 hours/day Type of Home: House Home Access: Stairs to enter Entrance Stairs-Rails: None Entrance Stairs-Number of Steps: 1 Home Layout: One level Home Equipment: Clinical cytogeneticist - 2 wheels      Prior Function Level of Independence: Needs assistance   Gait / Transfers Assistance Needed: no DME for mobility  ADL's / Homemaking Assistance Needed: assist/supervision for bathing/dressing - pt prefers to take baths  Comments: Son giving background. Pt is unreliable historian     Hand Dominance   Dominant Hand: Right    Extremity/Trunk Assessment   Upper Extremity Assessment Upper Extremity Assessment: RUE deficits/detail RUE Deficits / Details: in splint RUE: Unable to fully assess due to immobilization    Lower Extremity Assessment Lower Extremity Assessment: Overall WFL for tasks assessed       Communication   Communication: No difficulties  Cognition Arousal/Alertness: Awake/alert Behavior During Therapy: WFL for tasks assessed/performed Overall Cognitive Status:  History of cognitive impairments - at baseline                                 General Comments: history of dementia (high functioning)      General Comments      Exercises     Assessment/Plan    PT Assessment Patient needs continued PT services  PT Problem List Decreased mobility;Decreased safety awareness;Decreased  activity tolerance;Decreased balance;Pain;Decreased cognition       PT Treatment Interventions Functional mobility training;Balance training;Gait training;Therapeutic activities;Neuromuscular re-education;Stair training;Therapeutic exercise;Patient/family education    PT Goals (Current goals can be found in the Care Plan section)  Acute Rehab PT Goals Patient Stated Goal: to return home PT Goal Formulation: With patient/family Time For Goal Achievement: 05/09/18 Potential to Achieve Goals: Good    Frequency Min 3X/week   Barriers to discharge        Co-evaluation               AM-PAC PT "6 Clicks" Mobility  Outcome Measure Help needed turning from your back to your side while in a flat bed without using bedrails?: A Lot Help needed moving from lying on your back to sitting on the side of a flat bed without using bedrails?: A Little Help needed moving to and from a bed to a chair (including a wheelchair)?: A Little Help needed standing up from a chair using your arms (e.g., wheelchair or bedside chair)?: A Little Help needed to walk in hospital room?: A Little Help needed climbing 3-5 steps with a railing? : A Little 6 Click Score: 17    End of Session Equipment Utilized During Treatment: Gait belt;Other (comment)(Right UE splint) Activity Tolerance: Patient tolerated treatment well;No increased pain Patient left: in chair;with family/visitor present;with call bell/phone within reach Nurse Communication: Mobility status PT Visit Diagnosis: History of falling (Z91.81);Other abnormalities of gait and mobility (R26.89);Pain Pain - Right/Left: Right Pain - part of body: Arm    Time: 1010-1022 PT Time Calculation (min) (ACUTE ONLY): 12 min   Charges:   PT Evaluation $PT Eval Low Complexity: 1 Low PT Treatments $Gait Training: 8-22 mins       Jabes Primo, PT, GCS 05/02/18,1:15 PM

## 2018-05-02 NOTE — ED Notes (Signed)
Attempted to call report, called charge phone, will call back in 5 minutes

## 2018-05-02 NOTE — ED Notes (Signed)
Attempted to call report, no answer

## 2018-05-02 NOTE — Op Note (Signed)
Orthopaedic Surgery Operative Note (CSN: [id number])  Michele Lambert  09-24-1926 Date of Surgery: 05/01/2018 - 05/02/2018   Diagnoses:  Right humerus fx extraarticular  Procedure: Open reduction internal fixation of right distal extra-articular humerus fracture 24586 Neuro lysis of radial nerve 64708   Operative Finding Successful completion of planned procedure.  She had mild paresthesias and weak radial nerve function prior to surgery.  We performed a neural lysis.  We had a good lag screw at her fracture site and placed a neutralization plate.  Good fixation above and below the fracture.  The bundle was at the proximal aspect of the plate just proximal to the most proximal screw.  We were careful to slide the plate under the bundle.  Post-operative plan: The patient will be motion as tolerated no weightbearing heavier than 5 pounds..  The patient will be readmitted to the hospitalist.  DVT prophylaxis not indicated for upper extremity surgery however defer to the hospitalist.  Pain control with PRN pain medication preferring oral medicines.  Follow up plan will be scheduled in approximately 7 days for incision check and XR.  Post-Op Diagnosis: Same Surgeons:Primary: Hiram Gash, MD Assistants: Joya Gaskins OPAC Location: [facility] ROOM 06 Anesthesia: Choice Antibiotics: Ancef 2g preop, Vancomycin 1000mg  locally  Tourniquet time: None Estimated Blood Loss: 50 Complications: None Specimens: None Implants: Implant Name Type Inv. Item Serial No. Manufacturer Lot No. LRB No. Used Action  PLATE LOCK TR LRG - [id number] Plate PLATE LOCK TR LRG  [employer](ORTH,TRAU,BIO,SG)  Right 1 Implanted  SCREW CORTICAL 3.5MM 24MM - [id number] Screw SCREW CORTICAL 3.5MM 24MM  [employer](ORTH,TRAU,BIO,SG)  Right 1 Implanted  SCREW NON LOCKING LP 3.5 14MM - [id number] Screw SCREW NON LOCKING LP 3.5 14MM  [employer](ORTH,TRAU,BIO,SG)  Right 1 Implanted  SCREW NON LOCKING LP 3.5 16MM - [id number] Screw SCREW  NON LOCKING LP 3.5 16MM  [employer](ORTH,TRAU,BIO,SG)  Right 1 Implanted  SCREW LOW PROFILE 18MMX3.5MM - [id number] Screw SCREW LOW PROFILE 18MMX3.5MM  [employer](ORTH,TRAU,BIO,SG)  Right 1 Implanted  SCREW CORT 3.5X26 - [id number] Screw SCREW CORT 3.5X26  [employer](ORTH,TRAU,BIO,SG)  Right 1 Implanted  SCREW LP NL T15 3.5X20 - [id number] Screw SCREW LP NL T15 3.5X20  [employer](ORTH,TRAU,BIO,SG)  Right 1 Implanted  SCREW CORT LOCK 3.5MM 16MM - [id number] Screw SCREW CORT LOCK 3.5MM 16MM  [employer](ORTH,TRAU,BIO,SG)  Right 2 Implanted  SCREW CORT LOCK 3.5MM 18MM - [id number] Screw SCREW CORT LOCK 3.5MM 18MM  [employer](ORTH,TRAU,BIO,SG)  Right 1 Implanted  SCREW CORT LOCK 3.5MM 20MM - [id number] Screw SCREW CORT LOCK 3.5MM 20MM  [employer](ORTH,TRAU,BIO,SG)  Right 1 Implanted  WASHER 3.5MM - [id number] Orthopedic Implant WASHER 3.5MM  [employer](ORTH,TRAU,BIO,SG)  Right 4 Implanted    Indications for Surgery:   Michele Lambert is a 85 y.o. female with fall resulting in an extra-articular distal humerus fracture.  Due to displacement and the patient's relative function and health even in light of her dementia we felt that surgery may be warranted.  There is a difficult fracture to treat nonoperatively.  We discussed this with her family.  Benefits and risks of operative and nonoperative management were discussed prior to surgery with patient/guardian(s) and informed consent form was completed.  Specific risks including infection, need for additional surgery, nerve palsy, nonunion, malunion, loss of fixation   Procedure:   The patient was identified in the preoperative holding area where the surgical site was marked. The patient was taken to the OR where a procedural timeout  was called and the above noted anesthesia was induced.  The patient was positioned lateral on a beanbag with the right arm up.  Preoperative antibiotics were dosed.  The patient's right arm was prepped and draped in the  usual sterile fashion.  A second preoperative timeout was called.      We began by marking our bony landmarks and made a posterior longitudinal excision over the triceps itself.  We identified the triceps fascia and open this.  The triceps tendon was identified distally and we stayed on the lateral aspect of the triceps using a pair tricipital approach throughout the case.    Is relatively simple fracture with an oblique spiral type component sick centimeters above the joint line.   We identified the neurovascular bundle in the form of the radial nerve and radial artery neurolysed these from the lateral septum around the posterior aspect of the humerus placing a Penrose drain with a tag stitch to identify this bundle later and mobilize it.   Placed a single 3 5 lag screw by technique across the fracture site and then used a neutralization plate.  The radial nerve and artery were protected with a blunt retractor throughout this.   We then used fluoroscopy to select an  Biomet extra-articular distal humerus plate and centered on the posterior aspect of the humerus and on the lateral column of the distal humerus.  The plate was carefully slid underneath the bundle to avoid compression of the structure.  The bundle in its final position laid just proximal to the most proximal screw on the plate. Plate fixed with  4 screws, 2 locking and 2 nonlocking, proximal to fracture and a combination of 4 locking and non-locking screws distally.   We had great fixation at the end of the case and took final fluoroscopic images prior to irrigating copiously.  We then placed local vancomycin powder.   The lateral paratriciptal window was closed taking care to avoid binding the neurovascular bundle still closing dead space.    The incision was thoroughly irrigated and closed in a multilayer fashion with absorbable sutures. A sterile dressing was placed.   The patient was awoken from general anesthesia and taken to the  PACU in stable condition without complication.   Joya Gaskins, OPA-C, present and scrubbed throughout the case, critical for completion in a timely fashion, and for retraction, instrumentation, closure.

## 2018-05-02 NOTE — Anesthesia Procedure Notes (Signed)
Procedure Name: Intubation Date/Time: 05/02/2018 1:17 PM Performed by: Mariea Clonts, CRNA Pre-anesthesia Checklist: Patient identified, Emergency Drugs available, Suction available and Patient being monitored Patient Re-evaluated:Patient Re-evaluated prior to induction Oxygen Delivery Method: Circle System Utilized Preoxygenation: Pre-oxygenation with 100% oxygen Induction Type: IV induction Ventilation: Mask ventilation without difficulty Laryngoscope Size: Mac and 3 Grade View: Grade I Tube type: Oral Tube size: 7.0 mm Number of attempts: 1 Airway Equipment and Method: Stylet and Oral airway Placement Confirmation: ETT inserted through vocal cords under direct vision,  positive ETCO2 and breath sounds checked- equal and bilateral Tube secured with: Tape Dental Injury: Teeth and Oropharynx as per pre-operative assessment

## 2018-05-02 NOTE — Consult Note (Signed)
I saw the patient and agree with Dr. Ayesha Rumpf note it was left this morning.  Patient has dementia but is relatively healthy and has a extremely displaced comminuted distal humerus fracture extra-articular.  Feel appropriate for fixation and we discussed this with her family.  After discussion of the risks and benefits including nerve injury arterial injury nonunion malunion the patient's family elected to proceed.  This will allow early mobilization and pain control.  Michele Lambert understands these risks and like to proceed.  Patient's radial nerve is weak but working preop, distal motor and sensory is otherwise intact.  We will check skin in the setting of the operating room.  Cannot check it currently secondary to splint placement.

## 2018-05-02 NOTE — Progress Notes (Signed)
Orthopedic Tech Progress Note Patient Details:  Michele Lambert 1927/01/06 [id number]  Ortho Devices Type of Ortho Device: Arm sling, Post (long arm) splint, Other (comment) Ortho Device/Splint Location: Dr assisted with application of post long arm splint and humerus part of double sugartong.  Ortho Device/Splint Interventions: Ordered, Application, Adjustment   Post Interventions Patient Tolerated: Well Instructions Provided: Care of device, Adjustment of device   Karolee Stamps 05/02/2018, 4:42 AM

## 2018-05-03 ENCOUNTER — Encounter (HOSPITAL_COMMUNITY): Payer: Self-pay | Admitting: Orthopaedic Surgery

## 2018-05-03 DIAGNOSIS — Y92015 Private garage of single-family (private) house as the place of occurrence of the external cause: Secondary | ICD-10-CM | POA: Diagnosis not present

## 2018-05-03 DIAGNOSIS — G9340 Encephalopathy, unspecified: Secondary | ICD-10-CM | POA: Diagnosis present

## 2018-05-03 DIAGNOSIS — F0390 Unspecified dementia without behavioral disturbance: Secondary | ICD-10-CM | POA: Diagnosis present

## 2018-05-03 DIAGNOSIS — R202 Paresthesia of skin: Secondary | ICD-10-CM | POA: Diagnosis present

## 2018-05-03 DIAGNOSIS — Z888 Allergy status to other drugs, medicaments and biological substances status: Secondary | ICD-10-CM | POA: Diagnosis not present

## 2018-05-03 DIAGNOSIS — Z79890 Hormone replacement therapy (postmenopausal): Secondary | ICD-10-CM | POA: Diagnosis not present

## 2018-05-03 DIAGNOSIS — R339 Retention of urine, unspecified: Secondary | ICD-10-CM | POA: Diagnosis present

## 2018-05-03 DIAGNOSIS — R410 Disorientation, unspecified: Secondary | ICD-10-CM | POA: Diagnosis present

## 2018-05-03 DIAGNOSIS — S42491A Other displaced fracture of lower end of right humerus, initial encounter for closed fracture: Secondary | ICD-10-CM | POA: Diagnosis present

## 2018-05-03 DIAGNOSIS — S42409A Unspecified fracture of lower end of unspecified humerus, initial encounter for closed fracture: Secondary | ICD-10-CM | POA: Diagnosis present

## 2018-05-03 DIAGNOSIS — I10 Essential (primary) hypertension: Secondary | ICD-10-CM | POA: Diagnosis present

## 2018-05-03 DIAGNOSIS — Z66 Do not resuscitate: Secondary | ICD-10-CM | POA: Diagnosis present

## 2018-05-03 DIAGNOSIS — Z885 Allergy status to narcotic agent status: Secondary | ICD-10-CM | POA: Diagnosis not present

## 2018-05-03 DIAGNOSIS — E039 Hypothyroidism, unspecified: Secondary | ICD-10-CM | POA: Diagnosis present

## 2018-05-03 DIAGNOSIS — S0011XA Contusion of right eyelid and periocular area, initial encounter: Secondary | ICD-10-CM | POA: Diagnosis present

## 2018-05-03 DIAGNOSIS — W1830XA Fall on same level, unspecified, initial encounter: Secondary | ICD-10-CM | POA: Diagnosis present

## 2018-05-03 LAB — BASIC METABOLIC PANEL
Anion gap: 6 (ref 5–15)
BUN: 19 mg/dL (ref 8–23)
CO2: 26 mmol/L (ref 22–32)
Calcium: 8.4 mg/dL — ABNORMAL LOW (ref 8.9–10.3)
Chloride: 109 mmol/L (ref 98–111)
Creatinine, Ser: 1.03 mg/dL — ABNORMAL HIGH (ref 0.44–1.00)
GFR calc Af Amer: 56 mL/min — ABNORMAL LOW (ref >60)
GFR calc non Af Amer: 48 mL/min — ABNORMAL LOW (ref >60)
Glucose, Bld: 94 mg/dL (ref 70–99)
Potassium: 4.3 mmol/L (ref 3.5–5.1)
Sodium: 141 mmol/L (ref 135–145)

## 2018-05-03 LAB — CBC
HCT: 34.4 % — ABNORMAL LOW (ref 36.0–46.0)
Hemoglobin: 11 g/dL — ABNORMAL LOW (ref 12.0–15.0)
MCH: 28.8 pg (ref 26.0–34.0)
MCHC: 32 g/dL (ref 30.0–36.0)
MCV: 90.1 fL (ref 80.0–100.0)
Platelets: 172 10*3/uL (ref 150–400)
RBC: 3.82 MIL/uL — ABNORMAL LOW (ref 3.87–5.11)
RDW: 14.4 % (ref 11.5–15.5)
WBC: 12.7 10*3/uL — ABNORMAL HIGH (ref 4.0–10.5)
nRBC: 0 % (ref 0.0–0.2)

## 2018-05-03 LAB — MAGNESIUM: Magnesium: 2 mg/dL (ref 1.7–2.4)

## 2018-05-03 NOTE — Clinical Social Work Note (Signed)
Clinical Social Work Assessment  Patient Details  Name: Michele Lambert MRN: [id number] Date of Birth: [birth year]  Date of referral:  05/03/18               Reason for consult:  Discharge Planning                Permission sought to share information with:  Case Manager, Facility Sport and exercise psychologist, Family Supports Permission granted to share information::  Yes, Verbal Permission Granted  Name::     Art therapist::  SNFs  Relationship::  son  Contact Information:  [phone]  Housing/Transportation Living arrangements for the past 2 months:  Single Family Home Source of Information:  Adult Children Patient Interpreter Needed:  None Criminal Activity/Legal Involvement Pertinent to Current Situation/Hospitalization:  No - Comment as needed Significant Relationships:  Adult Children Lives with:  Self Do you feel safe going back to the place where you live?  No Need for family participation in patient care:  Yes (Comment)  Care giving concerns:  CSW received referral for possible SNF placement at time of discharge. Spoke with patient's son Michele Lambert at bedside regarding possibility of SNF placement as patient has severe dementia . Patient's family    is currently unable to care for her at their home given patient's current needs and fall risk.  Patient's family  expressed understanding of PT recommendation and are agreeable to SNF placement at time of discharge. CSW to continue to follow and assist with discharge planning needs.     Social Worker assessment / plan:  Spoke with patient's sons concerning possibility of rehab at [facility] before returning home.    Employment status:  Retired Forensic scientist:  Medicare PT Recommendations:  [place] / Referral to community resources:  [place]  Patient/Family's Response to care:  Patient's sons recognize need for rehab before returning home and are agreeable to a SNF. They report preference  for  [facility]   . CSW explained insurance authorization process. Patient's family reported that they want patient to get stronger to be able to come back home.    Patient/Family's Understanding of and Emotional Response to Diagnosis, Current Treatment, and Prognosis:  Patient/family is realistic regarding therapy needs and expressed being hopeful for SNF placement. Patient expressed understanding of CSW role and discharge process as well as medical condition. No questions/concerns about plan or treatment.    Emotional Assessment Appearance:  Appears stated age Attitude/Demeanor/Rapport:  Apprehensive Affect (typically observed):  Agitated Orientation:  Oriented to Self Alcohol / Substance use:  Not Applicable Psych involvement (Current and /or in the community):  No (Comment)  Discharge Needs  Concerns to be addressed:  Discharge Planning Concerns, Cognitive Concerns Readmission within the last 30 days:  No Current discharge risk:  Dependent with Mobility Barriers to Discharge:  Continued Medical Work up   [employer], LCSW 05/03/2018, 3:17 PM

## 2018-05-03 NOTE — Progress Notes (Signed)
PROGRESS NOTE    [name]  NWG:[id number] DOB: [birth year] [name]: 05/01/2018 PCP: Dion Body, MD   Brief Narrative:  Michele Lambert is Michele Lambert 85 y.o. female with medical history significant for Hypothyroidism and Dementia who presents to the ED after an unwitnessed fall at home.  History is limited from patient due to underlying dementia, and therefore majority of history is obtained from family at bedside and chart review.  Patient apparently had an unwitnessed fall in her garage sometime between 2-4 PM on 05/01/2018.  She was at her baseline when her home caregiver left the house around 2 PM.  Patient's grandson went to see her around 4 PM and found her lying on the ground of the garage with her right arm extended out and above her head in an odd angle.    Patient herself currently denies any chest pain, palpitations, lightheadedness, dizziness, nausea, vomiting, dyspnea, abdominal pain, diarrhea, constipation, dysuria.  She reports some pain in her right arm and right eyebrow.  Per family, at baseline patient is fairly high functioning and ambulates without assist but does have underlying dementia with occasional episodes of confusion and difficulty with recall  Assessment & Plan:   Principal Problem:   Displaced fracture of right humerus Active Problems:   Hypothyroidism   Dementia ([place])   Unwitnessed fall   Fracture of distal humerus   Acute oblique overriding and displaced fracture of the right distal humerus: Occurred after unwitnessed fall.  Patient placed in long-arm splint which is wrapped and placed in Namiko Pritts sling for downward traction.  -S/p open reduction internal fixation distal humerus fracture on 1/29 -Pain control with scheduled APAP, prn oxycodone Per orthopedics Weightbearing: Less than 5 lb WB RUE, ROM as tolerated Insicional and dressing care: Remove dressings POD7 Orthopedic device(s): None Showering: prn, bulky dressing can be removed prior but aquacel stays in  place VTE prophylaxis: Per primary Pain control: prn meds, minimize narcotics Follow - up plan: 1 week  Unwitnessed fall: Patient does not recall any of the conditions surrounding the event.  She is reportedly freely mobile at baseline without the need to use assistive devices. -Monitor on telemetry -PT/OT eval after surgery - PT now recommending SNF -Fall precautions -Gentle IV fluids overnight  Hypothyroidism: Last TSH 2.972, free T4 1.06 on 03/30/2018. -Continue Synthroid  Dementia: -Delirium precautions  Urinary Retention - trial of void today  DVT prophylaxis: heparin Code Status: full code Family Communication: son, son over phone Disposition Plan: inpatient given advanced age in setting of distal humerus fracture, in addition, she does not currently have [facility] Burnham safe discharge disposition with her increased unsteadiness and difficulty with ambulation when working with PT.  She was living independently prior to admission and does not have 24/7 supervision available.   Consultants:   orthopedics  Procedures:   none  Antimicrobials:  Anti-infectives (From admission, onward)   Start     Dose/Rate Route Frequency Ordered Stop   05/02/18 1630  ceFAZolin (ANCEF) IVPB 1 g/50 mL premix     1 g 100 mL/hr over 30 Minutes Intravenous Every 8 hours 05/02/18 1628 05/02/18 2204   05/02/18 1230  ceFAZolin (ANCEF) IVPB 2g/100 mL premix     2 g 200 mL/hr over 30 Minutes Intravenous On call to O.R. 05/02/18 1216 05/02/18 1322      Subjective: Pain ok. Remains confused. Son at bedside. Discussed over phone with son as well.  Objective: Vitals:   05/02/18 2334 05/03/18 0359 05/03/18 1009 05/03/18 1316  BP: (!) 154/77 112/90 (!) 174/78 138/67  Pulse: 77 72 82 86  Resp: 18 20    Temp: 99 F (37.2 C) 98.4 F (36.9 C) 98.4 F (36.9 C)   TempSrc: Oral Oral Oral   SpO2: 97% 98% 98%   Weight:      Height:        Intake/Output Summary (Last 24 hours) at 05/03/2018 1605 Last  data filed at 05/03/2018 1200 Gross per 24 hour  Intake 1410 ml  Output 1700 ml  Net -290 ml   Filed Weights   05/02/18 0100 05/02/18 0131  Weight: 52 kg 52 kg    Examination:  General: No acute distress. Cardiovascular: Heart sounds show Kayvon Mo regular rate, and rhythm. Lungs: Clear to auscultation bilaterally Abdomen: Soft, nontender, nondistended  Neurological: Alert, but remains confused. Moves all extremities 4. Cranial nerves II through XII grossly intact. Skin: Warm and dry. No rashes or lesions. Extremities: R arm with dressing intact in sling   Data Reviewed: I have personally reviewed following labs and imaging studies  CBC: Recent Labs  Lab 05/01/18 2002 05/02/18 0330 05/03/18 0656  WBC 17.6* 11.4* 12.7*  NEUTROABS 15.6*  --   --   HGB 12.4 10.8* 11.0*  HCT 38.3 33.9* 34.4*  MCV 91.0 90.2 90.1  PLT 198 129* 258   Basic Metabolic Panel: Recent Labs  Lab 05/01/18 2002 05/02/18 0330 05/03/18 0656  NA 139 140 141  K 3.8 3.9 4.3  CL 105 107 109  CO2 25 25 26   GLUCOSE 125* 126* 94  BUN 32* 30* 19  CREATININE 0.99 0.91 1.03*  CALCIUM 8.8* 8.6* 8.4*  MG  --   --  2.0   GFR: Estimated Creatinine Clearance: 26.6 mL/min (Oma Alpert) (by C-G formula based on SCr of 1.03 mg/dL (H)). Liver Function Tests: No results for input(s): AST, ALT, ALKPHOS, BILITOT, PROT, ALBUMIN in the last 168 hours. No results for input(s): LIPASE, AMYLASE in the last 168 hours. No results for input(s): AMMONIA in the last 168 hours. Coagulation Profile: No results for input(s): INR, PROTIME in the last 168 hours. Cardiac Enzymes: Recent Labs  Lab 05/01/18 2002  CKTOTAL 279*   BNP (last 3 results) No results for input(s): PROBNP in the last 8760 hours. HbA1C: No results for input(s): HGBA1C in the last 72 hours. CBG: No results for input(s): GLUCAP in the last 168 hours. Lipid Profile: No results for input(s): CHOL, HDL, LDLCALC, TRIG, CHOLHDL, LDLDIRECT in the last 72  hours. Thyroid Function Tests: No results for input(s): TSH, T4TOTAL, FREET4, T3FREE, THYROIDAB in the last 72 hours. Anemia Panel: No results for input(s): VITAMINB12, FOLATE, FERRITIN, TIBC, IRON, RETICCTPCT in the last 72 hours. Sepsis Labs: No results for input(s): PROCALCITON, LATICACIDVEN in the last 168 hours.  Recent Results (from the past 240 hour(s))  Surgical pcr screen     Status: None   Collection Time: 05/02/18  1:50 AM  Result Value Ref Range Status   MRSA, PCR NEGATIVE NEGATIVE Final   Staphylococcus aureus NEGATIVE NEGATIVE Final    Comment: (NOTE) The Xpert SA Assay (FDA approved for NASAL specimens in patients 63 years of age and older), is one component of Nymir Ringler comprehensive surveillance program. It is not intended to diagnose infection nor to guide or monitor treatment. Performed at [facility], [place] [address] [place], [place] [address]          Radiology Studies: Dg Shoulder Right  Result Date: 05/01/2018 CLINICAL DATA:  85  y/o  F; fall with deformity to the right humerus. EXAM: RIGHT HUMERUS - 2+ VIEW; RIGHT SHOULDER - 2+ VIEW COMPARISON:  None. FINDINGS: Right humerus: Acute oblique fracture of the distal right femoral diaphysis with 1 shaft's with lateral displacement of the proximal fracture component, 3.7 cm overriding, and apex anterolateral angulation. The elbow joint is maintained. Right shoulder: No acute fracture or dislocation the shoulder joint. Acromioclavicular and coracoclavicular distances are normal. Decreased acromial humeral distance may represent underlying rotator cuff injury. IMPRESSION: Acute oblique overriding and displaced fracture of the right distal humeral diaphysis. No dislocation. Electronically Signed   By: Kristine Garbe M.D.   On: 05/01/2018 22:05   Dg Elbow 2 Views Right  Result Date: 05/02/2018 CLINICAL DATA:  Right distal humerus ORIF. EXAM: RIGHT ELBOW - 2 VIEW; DG C-ARM 61-120 MIN COMPARISON:  Right humerus  x-rays from yesterday. FLUOROSCOPY TIME:  28 seconds. C-arm fluoroscopic images were obtained intraoperatively and submitted for post operative interpretation. FINDINGS: Frontal lateral intraoperative fluoroscopic images demonstrate interval posterior plate and screw fixation of the distal humeral diaphyseal fracture. Alignment is now anatomic. The elbow joint is unremarkable. IMPRESSION: 1. Intraoperative fluoroscopic guidance for distal humerus ORIF, now in anatomic alignment. Electronically Signed   By: Titus Dubin M.D.   On: 05/02/2018 15:05   Ct Head Wo Contrast  Result Date: 05/01/2018 CLINICAL DATA:  85 year old female with head, face and neck injury following fall today. Initial encounter. EXAM: CT HEAD WITHOUT CONTRAST CT MAXILLOFACIAL WITHOUT CONTRAST CT CERVICAL SPINE WITHOUT CONTRAST TECHNIQUE: Multidetector CT imaging of the head, cervical spine, and maxillofacial structures were performed using the standard protocol without intravenous contrast. Multiplanar CT image reconstructions of the cervical spine and maxillofacial structures were also generated. COMPARISON:  11/21/2017 CTs FINDINGS: CT HEAD FINDINGS Brain: No evidence of acute infarction, hemorrhage, hydrocephalus, extra-axial collection or mass lesion/mass effect. Atrophy and chronic small-vessel white matter ischemic changes again noted. Vascular: Carotid atherosclerotic calcifications again noted. Skull: Normal. Negative for fracture or focal lesion. Other: None. CT MAXILLOFACIAL FINDINGS Osseous: No acute fracture or dislocation. No suspicious focal bony abnormalities noted. Orbits: Negative. No traumatic or inflammatory finding. Sinuses: Clear. Soft tissues: Negative. CT CERVICAL SPINE FINDINGS Alignment: No acute subluxation. Skull base and vertebrae: No acute fracture. No primary bone lesion or focal pathologic process. Soft tissues and spinal canal: No prevertebral fluid or swelling. No visible canal hematoma. Disc levels: Mild  to moderate multilevel degenerative disc disease/spondylosis again noted. Upper chest: No acute abnormality. Other: None IMPRESSION: 1. No evidence of acute intracranial abnormality. Atrophy and chronic small-vessel white matter ischemic changes. 2. No evidence of acute facial injury. 3. No static evidence of acute injury to the cervical spine. Unchanged mild to moderate multilevel degenerative disc disease/spondylosis. Electronically Signed   By: Margarette Canada M.D.   On: 05/01/2018 21:57   Ct Cervical Spine Wo Contrast  Result Date: 05/01/2018 CLINICAL DATA:  85 year old female with head, face and neck injury following fall today. Initial encounter. EXAM: CT HEAD WITHOUT CONTRAST CT MAXILLOFACIAL WITHOUT CONTRAST CT CERVICAL SPINE WITHOUT CONTRAST TECHNIQUE: Multidetector CT imaging of the head, cervical spine, and maxillofacial structures were performed using the standard protocol without intravenous contrast. Multiplanar CT image reconstructions of the cervical spine and maxillofacial structures were also generated. COMPARISON:  11/21/2017 CTs FINDINGS: CT HEAD FINDINGS Brain: No evidence of acute infarction, hemorrhage, hydrocephalus, extra-axial collection or mass lesion/mass effect. Atrophy and chronic small-vessel white matter ischemic changes again noted. Vascular: Carotid atherosclerotic calcifications again noted. Skull: Normal.  Negative for fracture or focal lesion. Other: None. CT MAXILLOFACIAL FINDINGS Osseous: No acute fracture or dislocation. No suspicious focal bony abnormalities noted. Orbits: Negative. No traumatic or inflammatory finding. Sinuses: Clear. Soft tissues: Negative. CT CERVICAL SPINE FINDINGS Alignment: No acute subluxation. Skull base and vertebrae: No acute fracture. No primary bone lesion or focal pathologic process. Soft tissues and spinal canal: No prevertebral fluid or swelling. No visible canal hematoma. Disc levels: Mild to moderate multilevel degenerative disc  disease/spondylosis again noted. Upper chest: No acute abnormality. Other: None IMPRESSION: 1. No evidence of acute intracranial abnormality. Atrophy and chronic small-vessel white matter ischemic changes. 2. No evidence of acute facial injury. 3. No static evidence of acute injury to the cervical spine. Unchanged mild to moderate multilevel degenerative disc disease/spondylosis. Electronically Signed   By: Margarette Canada M.D.   On: 05/01/2018 21:57   Dg Chest Port 1 View  Result Date: 05/01/2018 CLINICAL DATA:  Pain. Found down. EXAM: PORTABLE CHEST 1 VIEW COMPARISON:  None. FINDINGS: Low lung volumes.The cardiomediastinal contours are normal. Atherosclerosis of the thoracic aorta. Pulmonary vasculature is normal. Skin fold projects over the left chest. No consolidation, pleural effusion, or pneumothorax. No acute osseous abnormalities are seen. IMPRESSION: Low lung volumes without acute abnormality. Electronically Signed   By: Keith Rake M.D.   On: 05/01/2018 22:18   Dg Humerus Right  Result Date: 05/02/2018 CLINICAL DATA:  ORIF right humerus. EXAM: RIGHT HUMERUS - 2+ VIEW COMPARISON:  05/02/2018 intraoperative images. FINDINGS: Screw and plate fixation of the distal right humerus, in anatomic alignment. Expected postoperative soft tissue gas. IMPRESSION: Screw and plate fixation of the distal right humerus, in anatomic alignment. Electronically Signed   By: Ulyses Jarred M.D.   On: 05/02/2018 15:59   Dg Humerus Right  Result Date: 05/01/2018 CLINICAL DATA:  85 y/o  F; fall with deformity to the right humerus. EXAM: RIGHT HUMERUS - 2+ VIEW; RIGHT SHOULDER - 2+ VIEW COMPARISON:  None. FINDINGS: Right humerus: Acute oblique fracture of the distal right femoral diaphysis with 1 shaft's with lateral displacement of the proximal fracture component, 3.7 cm overriding, and apex anterolateral angulation. The elbow joint is maintained. Right shoulder: No acute fracture or dislocation the shoulder joint.  Acromioclavicular and coracoclavicular distances are normal. Decreased acromial humeral distance may represent underlying rotator cuff injury. IMPRESSION: Acute oblique overriding and displaced fracture of the right distal humeral diaphysis. No dislocation. Electronically Signed   By: Kristine Garbe M.D.   On: 05/01/2018 22:05   Dg C-arm 1-60 Min  Result Date: 05/02/2018 CLINICAL DATA:  Right distal humerus ORIF. EXAM: RIGHT ELBOW - 2 VIEW; DG C-ARM 61-120 MIN COMPARISON:  Right humerus x-rays from yesterday. FLUOROSCOPY TIME:  28 seconds. C-arm fluoroscopic images were obtained intraoperatively and submitted for post operative interpretation. FINDINGS: Frontal lateral intraoperative fluoroscopic images demonstrate interval posterior plate and screw fixation of the distal humeral diaphyseal fracture. Alignment is now anatomic. The elbow joint is unremarkable. IMPRESSION: 1. Intraoperative fluoroscopic guidance for distal humerus ORIF, now in anatomic alignment. Electronically Signed   By: Titus Dubin M.D.   On: 05/02/2018 15:05   Ct Maxillofacial Wo Cm  Result Date: 05/01/2018 CLINICAL DATA:  85 year old female with head, face and neck injury following fall today. Initial encounter. EXAM: CT HEAD WITHOUT CONTRAST CT MAXILLOFACIAL WITHOUT CONTRAST CT CERVICAL SPINE WITHOUT CONTRAST TECHNIQUE: Multidetector CT imaging of the head, cervical spine, and maxillofacial structures were performed using the standard protocol without intravenous contrast. Multiplanar CT image reconstructions of  the cervical spine and maxillofacial structures were also generated. COMPARISON:  11/21/2017 CTs FINDINGS: CT HEAD FINDINGS Brain: No evidence of acute infarction, hemorrhage, hydrocephalus, extra-axial collection or mass lesion/mass effect. Atrophy and chronic small-vessel white matter ischemic changes again noted. Vascular: Carotid atherosclerotic calcifications again noted. Skull: Normal. Negative for fracture  or focal lesion. Other: None. CT MAXILLOFACIAL FINDINGS Osseous: No acute fracture or dislocation. No suspicious focal bony abnormalities noted. Orbits: Negative. No traumatic or inflammatory finding. Sinuses: Clear. Soft tissues: Negative. CT CERVICAL SPINE FINDINGS Alignment: No acute subluxation. Skull base and vertebrae: No acute fracture. No primary bone lesion or focal pathologic process. Soft tissues and spinal canal: No prevertebral fluid or swelling. No visible canal hematoma. Disc levels: Mild to moderate multilevel degenerative disc disease/spondylosis again noted. Upper chest: No acute abnormality. Other: None IMPRESSION: 1. No evidence of acute intracranial abnormality. Atrophy and chronic small-vessel white matter ischemic changes. 2. No evidence of acute facial injury. 3. No static evidence of acute injury to the cervical spine. Unchanged mild to moderate multilevel degenerative disc disease/spondylosis. Electronically Signed   By: Margarette Canada M.D.   On: 05/01/2018 21:57        Scheduled Meds: . acetaminophen  650 mg Oral Q6H  . calcium carbonate  1 tablet Oral Daily  . heparin  5,000 Units Subcutaneous Q8H  . levothyroxine  88 mcg Oral QAC breakfast  . sodium chloride flush  3 mL Intravenous Q12H   Continuous Infusions: . 0.45 % NaCl with KCl 20 mEq / L 75 mL/hr at 05/03/18 0950     LOS: 0 days    Time spent: over 30 min    Fayrene Helper, MD [facility]   If 7PM-7AM, please contact night-coverage [email or web address] Password [email or web address] 05/03/2018, 4:05 PM

## 2018-05-03 NOTE — Progress Notes (Signed)
Physical Therapy Treatment Patient Details Name: Michele Lambert MRN: [id number] DOB: [birth year] Today's Date: 05/03/2018    History of Present Illness Michele Lambert is a 85 y.o. female with medical history significant for Hypothyroidism and Dementia who presents to the ED after an unwitnessed fall at home. Radiographs revealed a comminuted right supracondylar elbow fracture. Pt is now s/p ORIF on 1/29.     PT Comments    Pt with increased unsteadiness and difficulty with ambulation. Pt with multiple LOBs throughout session and required min to mod A for steadying assist during gait training tasks. Pt also unable to sequence using cane. Educated son who was present during session about increased unsteadiness and difficulty ambulating. Son reports he has concerns about pt returning home as well. Feel pt would benefit from SNF level therapies at d/c to increase independence and safety with functional mobility. Will continue to follow acutely to maximize functional mobility independence and safety.    Follow Up Recommendations  SNF;Supervision/Assistance - 24 hour     Equipment Recommendations  None recommended by PT    Recommendations for Other Services       Precautions / Restrictions Precautions Precautions: Fall Required Braces or Orthoses: Sling Restrictions Weight Bearing Restrictions: Yes RUE Weight Bearing: Non weight bearing    Mobility  Bed Mobility Overal bed mobility: Needs Assistance Bed Mobility: Supine to Sit     Supine to sit: Supervision     General bed mobility comments: Sitting EOB with OT present.   Transfers Overall transfer level: Needs assistance Equipment used: 1 person hand held assist Transfers: Sit to/from Stand Sit to Stand: Mod assist;Min assist         General transfer comment: Pt with posterior lean upon standing and required manual assist for correcting. Min to mod A for lift assist and steadying to stand. Cues not to use RUE to assist with  transfers.   Ambulation/Gait Ambulation/Gait assistance: Min assist;Mod assist Gait Distance (Feet): 100 Feet Assistive device: Straight cane;1 person hand held assist Gait Pattern/deviations: Step-through pattern;Decreased stride length;Staggering right;Drifts right/left;Staggering left;Shuffle Gait velocity: Decreased    General Gait Details: Slow, very unsteady gait. Multiple LOB's during session requiring mod A for steadying. Attempted gait with cane, however, pt with difficulty sequencing and had increased difficulty with ambulation. Required min A otherwise for steadying assist.    Stairs             Wheelchair Mobility    Modified Rankin (Stroke Patients Only)       Balance Overall balance assessment: Needs assistance Sitting-balance support: No upper extremity supported;Feet supported Sitting balance-Leahy Scale: Fair     Standing balance support: Single extremity supported;During functional activity Standing balance-Leahy Scale: Poor Standing balance comment: Reliant on LUE and external support for gait.                             Cognition Arousal/Alertness: Awake/alert Behavior During Therapy: Impulsive Overall Cognitive Status: History of cognitive impairments - at baseline                                 General Comments: Required cues to wait for PT for mobility tasks. Pt with difficulty sequencing when attempting to walk with cane.       Exercises Other Exercises Other Exercises: digits - open and close fist    General Comments General comments (skin integrity,  edema, etc.): Son present during session and concerned about pt returning home.       Pertinent Vitals/Pain Pain Assessment: Faces Faces Pain Scale: Hurts little more Pain Location: R arm Pain Descriptors / Indicators: Discomfort Pain Intervention(s): Limited activity within patient's tolerance;Monitored during session;Repositioned    Home Living                       Prior Function            PT Goals (current goals can now be found in the care plan section) Acute Rehab PT Goals Patient Stated Goal: to be less wobbly PT Goal Formulation: With patient/family Time For Goal Achievement: 05/09/18 Potential to Achieve Goals: Good Progress towards PT goals: Progressing toward goals    Frequency    Min 3X/week      PT Plan Discharge plan needs to be updated    Co-evaluation              AM-PAC PT "6 Clicks" Mobility   Outcome Measure  Help needed turning from your back to your side while in a flat bed without using bedrails?: A Lot Help needed moving from lying on your back to sitting on the side of a flat bed without using bedrails?: A Lot Help needed moving to and from a bed to a chair (including a wheelchair)?: A Lot Help needed standing up from a chair using your arms (e.g., wheelchair or bedside chair)?: A Lot Help needed to walk in hospital room?: A Lot Help needed climbing 3-5 steps with a railing? : A Lot 6 Click Score: 12    End of Session Equipment Utilized During Treatment: Gait belt;Other (comment)(R sling) Activity Tolerance: Patient tolerated treatment well Patient left: in chair;with family/visitor present;with call bell/phone within reach Nurse Communication: Mobility status PT Visit Diagnosis: History of falling (Z91.81);Other abnormalities of gait and mobility (R26.89);Pain Pain - Right/Left: Right Pain - part of body: Arm     Time: 5329-9242 PT Time Calculation (min) (ACUTE ONLY): 26 min  Charges:  $Gait Training: 23-37 mins                     Leighton Ruff, PT, DPT  Acute Rehabilitation Services  Pager: [phone] Office: [phone]    Rudean Hitt 05/03/2018, 12:28 PM

## 2018-05-03 NOTE — Progress Notes (Signed)
ORTHOPAEDIC PROGRESS NOTE  s/p Procedure(s): OPEN REDUCTION INTERNAL FIXATION (ORIF) DISTAL HUMERUS FRACTURE  SUBJECTIVE: Reports mild pain about operative site. No chest pain. No SOB. No nausea/vomiting. No other complaints.  OBJECTIVE: PE:  Radial nerve is weak in setting of block but appears to be firing similarly to preop.  Difficult exam due to dementia and block.  wwp hand.  Dressing CDI  Vitals:   05/02/18 2334 05/03/18 0359  BP: (!) 154/77 112/90  Pulse: 77 72  Resp: 18 20  Temp: 99 F (37.2 C) 98.4 F (36.9 C)  SpO2: 97% 98%     ASSESSMENT: Michele Lambert is a 85 y.o. female doing well postoperatively.  PLAN: Weightbearing: Less than 5 lb WB RUE, ROM as tolerated Insicional and dressing care: Remove dressings POD7 Orthopedic device(s): None Showering: prn, bulky dressing can be removed prior but aquacel stays in place VTE prophylaxis: Per primary Pain control: prn meds, minimize narcotics Follow - up plan: 1 week Contact information:  Weekdays 8-5 Ophelia Charter MD [phone], After hours and holidays please check [email or web address] for group call information for [facility]  OK to dc from orthopedic perspective.

## 2018-05-03 NOTE — Progress Notes (Signed)
Occupational Therapy Treatment Patient Details Name: Michele Lambert MRN: [id number] DOB: Jun 10, 1926 Today's Date: 05/03/2018    History of present illness Michele Lambert is a 85 y.o. female with medical history significant for Hypothyroidism and Dementia who presents to the ED after an unwitnessed fall at home. Radiographs revealed a comminuted right supracondylar elbow fracture.    OT comments  Pt is not progressing toward OT due to cognitive deficits and increased confusion. Due to son being present during session, educated on safety concerns, don/ doff sling, and HEP. Handout provided with instructions for HEP. Pt demonstrated supervision for bed mobs and constant VC's to limit use of RUE. During hand/ wrist exercises patient required Min A to Mod A and Max verbal and visual cues to perform task correctly. Due to low functioning in cognition and confusion, OT recommends to reconsider DC setting, possibly to SNF for safety. Pt will benefit from continued OT to address ADLs, UB dressing for pt/ caregiver education of compensatory techniques to transition safely to next venue.   Follow Up Recommendations  Home health OT;Supervision/Assistance - 24 hour;SNF    Equipment Recommendations  None recommended by OT(Pt has appopriate DME at home)    Recommendations for Other Services      Precautions / Restrictions Precautions Precautions: Fall Required Braces or Orthoses: Sling Restrictions Weight Bearing Restrictions: Yes RUE Weight Bearing: Non weight bearing       Mobility Bed Mobility Overal bed mobility: Needs Assistance Bed Mobility: Supine to Sit     Supine to sit: Supervision     General bed mobility comments: cues for safety and limiting use of RUE.   Transfers                      Balance Overall balance assessment: Mild deficits observed, not formally tested                                         ADL either performed or assessed with clinical  judgement   ADL Overall ADL's : Needs assistance/impaired Eating/Feeding: Moderate assistance   Grooming: Moderate assistance                                 General ADL Comments: Pt is right hand dominant, she will require additional assist to manage BUE tasks, and increased assist for ADL. Pt requires constant reminders and VCs to limit use of RUE. Pt/ caregiver educated on don and doff of sling.      Vision   Vision Assessment?: Yes Additional Comments: Visual acuity deficits unable to read desired sign. "Call, don't fall"   Perception     Praxis      Cognition Arousal/Alertness: Awake/alert Behavior During Therapy: Impulsive Overall Cognitive Status: History of cognitive impairments - at baseline                                 General Comments: history of dementia (high functioning), difficulty with following commands.        Exercises Exercises: Other exercises Other Exercises Other Exercises: digits - open and close fist   Shoulder Instructions       General Comments Son present durinng session. Educated on sling and HEP.    Pertinent Vitals/ Pain  Pain Assessment: No/denies pain Faces Pain Scale: Hurts a little bit Pain Location: R arm Pain Descriptors / Indicators: Discomfort Pain Intervention(s): Monitored during session;Repositioned  Home Living                                          Prior Functioning/Environment              Frequency  Min 3X/week        Progress Toward Goals  OT Goals(current goals can now be found in the care plan section)  Progress towards OT goals: Not progressing toward goals - comment  Acute Rehab OT Goals Patient Stated Goal: to return home OT Goal Formulation: With patient/family Time For Goal Achievement: 05/16/18 Potential to Achieve Goals: Good ADL Goals Pt Will Perform Upper Body Bathing: with min guard assist;with caregiver independent in  assisting;sitting Pt Will Perform Upper Body Dressing: with min assist;with caregiver independent in assisting;sitting Pt Will Transfer to Toilet: with supervision;ambulating Pt Will Perform Toileting - Clothing Manipulation and hygiene: with min guard assist;with caregiver independent in assisting;sit to/from stand Pt/caregiver will Perform Home Exercise Program: Right Upper extremity;With Supervision;With written HEP provided Additional ADL Goal #1: Pt will perform bed mobility at mod I level prior to engaging in ADL  Plan Discharge plan remains appropriate    Co-evaluation                 AM-PAC OT "6 Clicks" Daily Activity     Outcome Measure   Help from another person eating meals?: A Lot Help from another person taking care of personal grooming?: A Lot Help from another person toileting, which includes using toliet, bedpan, or urinal?: A Lot Help from another person bathing (including washing, rinsing, drying)?: A Lot Help from another person to put on and taking off regular upper body clothing?: A Lot Help from another person to put on and taking off regular lower body clothing?: A Lot 6 Click Score: 12    End of Session Equipment Utilized During Treatment: (sling- pt transitioned to EOB.)  OT Visit Diagnosis: History of falling (Z91.81);Pain Pain - Right/Left: Right Pain - part of body: Arm   Activity Tolerance Patient tolerated treatment well   Patient Left with call bell/phone within reach;with family/visitor present;in bed;Other (comment)(PT following OT session.)   Nurse Communication Mobility status;Weight bearing status;Other (comment)        Time: 1700-1749 OT Time Calculation (min): 21 min  Charges: OT General Charges $OT Visit: 1 Visit OT Treatments $Self Care/Home Management : 8-22 mins  Minus Breeding, MSOT, OTR/L  Supplemental Rehabilitation Services  [phone]   Marius Ditch 05/03/2018, 11:59 AM

## 2018-05-03 NOTE — Progress Notes (Signed)
Bladder scan post foley catheter removal >330, pt assisted to bathroom and voided. Post void bladder scan <120.

## 2018-05-03 NOTE — Consult Note (Signed)
   [facility] CM Inpatient Consult   05/03/2018  Michele Lambert 28-Jan-1927 [id number]    Received call from [facility] with [place] for writer to cross check for [facility] eligibility.  This patient is not eligible for [facility] Care Management Services or [facility] First program.  Reason:  Not a beneficiary currently attributed to one of the [place] Registry populations. Also patient's PCP is not a [facility] provider.  Cory to follow up with inpatient RNCM to make aware of above notes.  Marthenia Rolling, MSN-Ed, RN,BSN [facility] Liaison [phone]

## 2018-05-03 NOTE — Plan of Care (Signed)
  Problem: Clinical Measurements: Goal: Ability to maintain clinical measurements within normal limits will improve Outcome: Progressing   Problem: Activity: Goal: Risk for activity intolerance will decrease Outcome: Progressing   Problem: Pain Managment: Goal: General experience of comfort will improve Outcome: Progressing   

## 2018-05-03 NOTE — Progress Notes (Signed)
CSW spoke with son regarding patient's discharge plan. Patient's son would like patient to go to SNF per PT recs, however patient is in observation status. CSW explained to family that Medicare requires a 3 night inpatient stay for SNF to be covered by insurance at discharge. At this time patient does not meet inpatient status requirements according to physician. CSW explained options of going to SNF paying out of pocket $300/day or home with home health.   Family continues to assess options at this time.   [place], [place] Maxwell Martorano

## 2018-05-03 NOTE — Care Management (Signed)
Physical therapy has determined that patient will need shortterm rehab in skilled setting. CM has notified unit Education officer, museum.

## 2018-05-03 NOTE — NC FL2 (Signed)
[place] LEVEL OF CARE SCREENING TOOL     IDENTIFICATION  Patient Name: Michele Lambert Birthdate: Dec 08, 1926 Sex: female Admission Date (Current Location): 05/01/2018  [facility] and [place] Number:  Engineering geologist and Address:  [facility], [place] [address] [place], [place] [address]      Provider Number:    Attending Physician Name and Address:  Elodia Florence., *  Relative Name and Phone Number:  Juleen China [phone]    Current Level of Care: Hospital Recommended Level of Care: [place] Prior Approval Number:    Date Approved/Denied:   PASRR Number: [id number]  Discharge Plan: SNF    Current Diagnoses: Patient Active Problem List   Diagnosis Date Noted  . Fracture of distal humerus 05/03/2018  . Displaced fracture of right humerus 05/01/2018  . Hypothyroidism 05/01/2018  . Dementia ([place]) 05/01/2018  . Unwitnessed fall 05/01/2018    Orientation RESPIRATION BLADDER Height & Weight     Self  Normal Continent Weight: 52 kg Height:  5' (152.4 cm)  BEHAVIORAL SYMPTOMS/MOOD NEUROLOGICAL BOWEL NUTRITION STATUS      Continent Diet(see discharge summary)  AMBULATORY STATUS COMMUNICATION OF NEEDS Skin   Extensive Assist Verbally Surgical wounds(right arm closed surgical incision, ecchymosis right eye and left knee, skin tear on right face)                       Personal Care Assistance Level of Assistance  Bathing, Feeding, Dressing, Total care Bathing Assistance: Maximum assistance Feeding assistance: Independent Dressing Assistance: Maximum assistance Total Care Assistance: Maximum assistance   Functional Limitations Info  Sight, Hearing, Speech Sight Info: Adequate Hearing Info: Adequate Speech Info: Adequate    SPECIAL CARE FACTORS FREQUENCY  PT (By licensed PT), OT (By licensed OT)     PT Frequency: min 5x weekly OT Frequency: min 5x weekly            Contractures Contractures  Info: Not present    Additional Factors Info  Code Status, Allergies Code Status Info: full Allergies Info: Codeine, Lescol (fluvastatin Sodium)           Current Medications (05/03/2018):  This is the current hospital active medication list Current Facility-Administered Medications  Medication Dose Route Frequency Provider Last Rate Last Dose  . 0.45 % NaCl with KCl 20 mEq / L infusion   Intravenous Continuous Paralee Cancel, MD 75 mL/hr at 05/03/18 0950    . acetaminophen (TYLENOL) tablet 650 mg  650 mg Oral Q6H Elodia Florence., MD   650 mg at 05/03/18 0950  . calcium carbonate (OS-CAL - dosed in mg of elemental calcium) tablet 500 mg of elemental calcium  1 tablet Oral Daily Zada Finders R, MD   500 mg of elemental calcium at 05/03/18 0950  . heparin injection 5,000 Units  5,000 Units Subcutaneous Q8H Lenore Cordia, MD   5,000 Units at 05/03/18 0520  . levothyroxine (SYNTHROID, LEVOTHROID) tablet 88 mcg  88 mcg Oral QAC breakfast Lenore Cordia, MD   88 mcg at 05/03/18 0520  . ondansetron (ZOFRAN) tablet 4 mg  4 mg Oral Q6H PRN Lenore Cordia, MD       Or  . ondansetron (ZOFRAN) injection 4 mg  4 mg Intravenous Q6H PRN Lenore Cordia, MD   4 mg at 05/02/18 1427  . oxyCODONE (Oxy IR/ROXICODONE) immediate release tablet 5 mg  5 mg Oral Q4H PRN Zada Finders  R, MD      . senna-docusate (Senokot-S) tablet 1 tablet  1 tablet Oral QHS PRN Zada Finders R, MD      . sodium chloride flush (NS) 0.9 % injection 3 mL  3 mL Intravenous Q12H Lenore Cordia, MD   3 mL at 05/02/18 1937     Discharge Medications: Please see discharge summary for a list of discharge medications.  Relevant Imaging Results:  Relevant Lab Results:   Additional Information SSN: [id number]  Alberteen Sam, LCSW

## 2018-05-03 NOTE — Plan of Care (Signed)

## 2018-05-04 LAB — COMPREHENSIVE METABOLIC PANEL
ALT: 11 U/L (ref 0–44)
AST: 27 U/L (ref 15–41)
Albumin: 3 g/dL — ABNORMAL LOW (ref 3.5–5.0)
Alkaline Phosphatase: 30 U/L — ABNORMAL LOW (ref 38–126)
Anion gap: 7 (ref 5–15)
BILIRUBIN TOTAL: 0.6 mg/dL (ref 0.3–1.2)
BUN: 18 mg/dL (ref 8–23)
CO2: 27 mmol/L (ref 22–32)
Calcium: 8.6 mg/dL — ABNORMAL LOW (ref 8.9–10.3)
Chloride: 108 mmol/L (ref 98–111)
Creatinine, Ser: 0.98 mg/dL (ref 0.44–1.00)
GFR calc Af Amer: 59 mL/min — ABNORMAL LOW (ref >60)
GFR calc non Af Amer: 51 mL/min — ABNORMAL LOW (ref >60)
Glucose, Bld: 99 mg/dL (ref 70–99)
Potassium: 4 mmol/L (ref 3.5–5.1)
Sodium: 142 mmol/L (ref 135–145)
TOTAL PROTEIN: 5.5 g/dL — AB (ref 6.5–8.1)

## 2018-05-04 LAB — CBC
HCT: 30.8 % — ABNORMAL LOW (ref 36.0–46.0)
Hemoglobin: 9.7 g/dL — ABNORMAL LOW (ref 12.0–15.0)
MCH: 28.4 pg (ref 26.0–34.0)
MCHC: 31.5 g/dL (ref 30.0–36.0)
MCV: 90.3 fL (ref 80.0–100.0)
Platelets: UNDETERMINED 10*3/uL (ref 150–400)
RBC: 3.41 MIL/uL — ABNORMAL LOW (ref 3.87–5.11)
RDW: 14.5 % (ref 11.5–15.5)
WBC: 11.4 10*3/uL — ABNORMAL HIGH (ref 4.0–10.5)
nRBC: 0 % (ref 0.0–0.2)

## 2018-05-04 LAB — MAGNESIUM: MAGNESIUM: 1.9 mg/dL (ref 1.7–2.4)

## 2018-05-04 MED ORDER — TRAZODONE HCL 50 MG PO TABS
50.0000 mg | ORAL_TABLET | Freq: Once | ORAL | Status: AC
  Administered 2018-05-04: 50 mg via ORAL
  Filled 2018-05-04: qty 1

## 2018-05-04 MED ORDER — ENOXAPARIN SODIUM 30 MG/0.3ML ~~LOC~~ SOLN
30.0000 mg | SUBCUTANEOUS | Status: DC
  Administered 2018-05-04 – 2018-05-05 (×2): 30 mg via SUBCUTANEOUS
  Filled 2018-05-04 (×2): qty 0.3

## 2018-05-04 NOTE — Progress Notes (Signed)
ORTHOPAEDIC PROGRESS NOTE  s/p Procedure(s): OPEN REDUCTION INTERNAL FIXATION (ORIF) DISTAL HUMERUS FRACTURE  SUBJECTIVE: No complaints.  OBJECTIVE: PE:  Radial nerve is weak but firing clearly as preop.  Difficult exam due to dementia.  wwp hand.  Dressing CDI  Vitals:   05/04/18 0429 05/04/18 1357  BP: (!) 153/70 (!) 158/70  Pulse: 78 77  Resp:  17  Temp: 98.6 F (37 C) 98.5 F (36.9 C)  SpO2: 100% 99%     ASSESSMENT: Michele Lambert is a 85 y.o. female doing well postoperatively.  PLAN: Weightbearing: Less than 5 lb WB RUE, ROM as tolerated Insicional and dressing care: Remove dressings POD7 Orthopedic device(s): None Showering: prn, bulky dressing can be removed prior but aquacel stays in place VTE prophylaxis: Per primary Pain control: prn meds, minimize narcotics Follow - up plan: 1 week Contact information:  Weekdays 8-5 Ophelia Charter MD [phone], After hours and holidays please check [email or web address] for group call information for [facility]  OK to dc from orthopedic perspective.

## 2018-05-04 NOTE — Progress Notes (Signed)
Patient's daughter is requesting for sleep medication for patient . Per daughter her mum has not slept in two nights . Text paged Jeannette Corpus, NP.

## 2018-05-04 NOTE — Plan of Care (Signed)

## 2018-05-04 NOTE — Plan of Care (Signed)
  Problem: Education: Goal: Knowledge of General Education information will improve Description Including pain rating scale, medication(s)/side effects and non-pharmacologic comfort measures 05/04/2018 1025 by Rance Muir, RN Outcome: Progressing 05/04/2018 1025 by Rance Muir, RN Outcome: Progressing   Problem: Health Behavior/Discharge Planning: Goal: Ability to manage health-related needs will improve 05/04/2018 1025 by Rance Muir, RN Outcome: Progressing 05/04/2018 1025 by Rance Muir, RN Outcome: Progressing   Problem: Clinical Measurements: Goal: Ability to maintain clinical measurements within normal limits will improve 05/04/2018 1025 by Rance Muir, RN Outcome: Progressing 05/04/2018 1025 by Rance Muir, RN Outcome: Progressing Goal: Will remain free from infection 05/04/2018 1025 by Rance Muir, RN Outcome: Progressing 05/04/2018 1025 by Rance Muir, RN Outcome: Progressing Goal: Diagnostic test results will improve 05/04/2018 1025 by Rance Muir, RN Outcome: Progressing 05/04/2018 1025 by Rance Muir, RN Outcome: Progressing Goal: Respiratory complications will improve 05/04/2018 1025 by Rance Muir, RN Outcome: Progressing 05/04/2018 1025 by Rance Muir, RN Outcome: Progressing Goal: Cardiovascular complication will be avoided 05/04/2018 1025 by Rance Muir, RN Outcome: Progressing 05/04/2018 1025 by Rance Muir, RN Outcome: Progressing   Problem: Activity: Goal: Risk for activity intolerance will decrease 05/04/2018 1025 by Rance Muir, RN Outcome: Progressing 05/04/2018 1025 by Rance Muir, RN Outcome: Progressing   Problem: Nutrition: Goal: Adequate nutrition will be maintained 05/04/2018 1025 by Rance Muir, RN Outcome: Progressing 05/04/2018 1025 by Rance Muir, RN Outcome: Progressing   Problem: Elimination: Goal: Will not experience complications related to bowel motility 05/04/2018 1025 by Rance Muir, RN Outcome: Progressing 05/04/2018 1025 by Rance Muir, RN Outcome: Progressing Goal: Will  not experience complications related to urinary retention 05/04/2018 1025 by Rance Muir, RN Outcome: Progressing 05/04/2018 1025 by Rance Muir, RN Outcome: Progressing   Problem: Pain Managment: Goal: General experience of comfort will improve 05/04/2018 1025 by Rance Muir, RN Outcome: Progressing 05/04/2018 1025 by Rance Muir, RN Outcome: Progressing   Problem: Safety: Goal: Ability to remain free from injury will improve 05/04/2018 1025 by Rance Muir, RN Outcome: Progressing 05/04/2018 1025 by Rance Muir, RN Outcome: Progressing   Problem: Skin Integrity: Goal: Risk for impaired skin integrity will decrease 05/04/2018 1025 by Rance Muir, RN Outcome: Progressing 05/04/2018 1025 by Rance Muir, RN Outcome: Progressing

## 2018-05-04 NOTE — Progress Notes (Signed)
Occupational Therapy Treatment Patient Details Name: Michele Lambert MRN: [id number] DOB: 1926/12/01 Today's Date: 05/04/2018    History of present illness Michele Lambert is a 85 y.o. female with medical history significant for Hypothyroidism and Dementia who presents to the ED after an unwitnessed fall at home. Radiographs revealed a comminuted right supracondylar elbow fracture. Pt is now s/p ORIF on 1/29.    OT comments  Pt making progress towards OT goals this session. Confusion continues. Pt was able to perform in room ambulation for toilet transfer mod A, sink level grooming at mod A, and ROM at the hand, wrist, and shoulder this session. Son present throughout session. Discharge updated to SNF as Pt requires increased assist for ADL and mobility - I do believe that short term SNF is appopriate because once she is able to weight bear through her right (dominant) arm she will be able to do more for herself.    Follow Up Recommendations  SNF;Supervision/Assistance - 24 hour    Equipment Recommendations  None recommended by OT    Recommendations for Other Services Other (comment)(Palliative Medicine)    Precautions / Restrictions Precautions Precautions: Fall Required Braces or Orthoses: Sling Restrictions Weight Bearing Restrictions: Yes RUE Weight Bearing: Non weight bearing       Mobility Bed Mobility               General bed mobility comments: OOB at beginning and end of session  Transfers Overall transfer level: Needs assistance Equipment used: 1 person hand held assist Transfers: Sit to/from Stand Sit to Stand: Mod assist         General transfer comment: modA initially from recliner and toilet, requires cuing to not use R UE to assist in power up    Balance Overall balance assessment: Needs assistance Sitting-balance support: No upper extremity supported;Feet supported Sitting balance-Leahy Scale: Fair     Standing balance support: Single extremity  supported;During functional activity Standing balance-Leahy Scale: Poor Standing balance comment: Reliant on LUE and external support for gait.                            ADL either performed or assessed with clinical judgement   ADL Overall ADL's : Needs assistance/impaired     Grooming: Moderate assistance;Wash/dry hands;Standing Grooming Details (indicate cue type and reason): sink level                 Toilet Transfer: Moderate assistance;Ambulation;Regular Glass blower/designer Details (indicate cue type and reason): 1 person HHA Toileting- Clothing Manipulation and Hygiene: Min guard;Sitting/lateral lean Toileting - Clothing Manipulation Details (indicate cue type and reason): on toilet, warm wash cloth       General ADL Comments: Pt is right hand dominant, she will require additional assist to manage BUE tasks, and increased assist for ADL. Pt requires constant reminders and VCs to limit use of RUE.     Vision       Perception     Praxis      Cognition Arousal/Alertness: Awake/alert Behavior During Therapy: Impulsive Overall Cognitive Status: History of cognitive impairments - at baseline                                 General Comments: Pt continues to attempt to use RUE for functional tasks and transfers despite sling        Exercises Exercises: Other  exercises Other Exercises Other Exercises: digits - open and close fist Other Exercises: shoulder abduction/FF   Shoulder Instructions       General Comments Son present and supportive throughout    Pertinent Vitals/ Pain       Pain Assessment: Faces Faces Pain Scale: Hurts little more Pain Location: R arm Pain Descriptors / Indicators: Discomfort Pain Intervention(s): Limited activity within patient's tolerance;Monitored during session;Repositioned  Home Living                                          Prior Functioning/Environment               Frequency  Min 3X/week        Progress Toward Goals  OT Goals(current goals can now be found in the care plan section)  Progress towards OT goals: Progressing toward goals(participatory with OT)  Acute Rehab OT Goals Patient Stated Goal: to be less wobbly OT Goal Formulation: With patient/family Time For Goal Achievement: 05/16/18 Potential to Achieve Goals: Good  Plan Discharge plan needs to be updated    Co-evaluation                 AM-PAC OT "6 Clicks" Daily Activity     Outcome Measure   Help from another person eating meals?: A Lot Help from another person taking care of personal grooming?: A Lot Help from another person toileting, which includes using toliet, bedpan, or urinal?: A Lot Help from another person bathing (including washing, rinsing, drying)?: A Lot Help from another person to put on and taking off regular upper body clothing?: A Lot Help from another person to put on and taking off regular lower body clothing?: A Lot 6 Click Score: 12    End of Session Equipment Utilized During Treatment: Gait belt;Other (comment)(sling)  OT Visit Diagnosis: History of falling (Z91.81);Pain Pain - Right/Left: Right Pain - part of body: Arm   Activity Tolerance Patient tolerated treatment well   Patient Left in chair;with call bell/phone within reach;with family/visitor present   Nurse Communication Mobility status;Weight bearing status        Time: 8413-2440 OT Time Calculation (min): 23 min  Charges: OT General Charges $OT Visit: 1 Visit OT Treatments $Self Care/Home Management : 23-37 mins  Hulda Humphrey OTR/L Acute Rehabilitation Services Pager: [phone] Office: [place] 05/04/2018, 5:39 PM

## 2018-05-04 NOTE — Plan of Care (Signed)
  Problem: Clinical Measurements: Goal: Ability to maintain clinical measurements within normal limits will improve Outcome: Progressing   Problem: Activity: Goal: Risk for activity intolerance will decrease Outcome: Progressing   Problem: Pain Managment: Goal: General experience of comfort will improve 05/04/2018 0611 by Irish Lack, RN Outcome: Progressing 05/03/2018 1958 by Irish Lack, RN Outcome: Progressing   Problem: Safety: Goal: Ability to remain free from injury will improve Outcome: Progressing

## 2018-05-04 NOTE — Progress Notes (Signed)
Physical Therapy Treatment Patient Details Name: Michele Lambert MRN: [id number] DOB: 1926/08/30 Today's Date: 05/04/2018    History of Present Illness Michele Lambert is a 85 y.o. female with medical history significant for Hypothyroidism and Dementia who presents to the ED after an unwitnessed fall at home. Radiographs revealed a comminuted right supracondylar elbow fracture. Pt is now s/p ORIF on 1/29.     PT Comments    Pt making progress towards her goals, however needs consistent cuing to not utilize R UE for power up from recliner and toilet. Pt is also limited in safe mobility by decreased balance and endurance. Pt requires modA for transfers and ambulation of 100 feet with HHA due to instability. D/c plans remain appropriate at this time. PT will continue to follow acutely.         Follow Up Recommendations  SNF;Supervision/Assistance - 24 hour     Equipment Recommendations  None recommended by PT       Precautions / Restrictions Precautions Precautions: Fall Required Braces or Orthoses: Sling Restrictions Weight Bearing Restrictions: Yes RUE Weight Bearing: Non weight bearing    Mobility  Bed Mobility               General bed mobility comments: sitting up in recliner on entry  Transfers Overall transfer level: Needs assistance Equipment used: 1 person hand held assist Transfers: Sit to/from Stand Sit to Stand: Mod assist;Min assist         General transfer comment: modA initially from [place] from toilet, requires cuing to not use R UE to assist in power up  Ambulation/Gait Ambulation/Gait assistance: Mod assist Gait Distance (Feet): 100 Feet Assistive device: 1 person hand held assist Gait Pattern/deviations: Festinating Gait velocity: Decreased    General Gait Details: Slow, very unsteady gait. Multiple LOB's during session requiring mod A for steadying. Attempted gait with cane, however, pt with difficulty sequencing and had increased  difficulty with ambulation. Required min A otherwise for steadying assist.        Balance Overall balance assessment: Needs assistance Sitting-balance support: No upper extremity supported;Feet supported Sitting balance-Leahy Scale: Fair     Standing balance support: Single extremity supported;During functional activity Standing balance-Leahy Scale: Poor Standing balance comment: Reliant on LUE and external support for gait.                             Cognition Arousal/Alertness: Awake/alert Behavior During Therapy: Impulsive Overall Cognitive Status: History of cognitive impairments - at baseline                                 General Comments: Required cues to wait for PT for mobility tasks. Pt with difficulty sequencing when attempting to walk with cane.          General Comments General comments (skin integrity, edema, etc.): Son present and supportive throughout      Pertinent Vitals/Pain Pain Assessment: Faces Faces Pain Scale: Hurts little more Pain Location: R arm Pain Descriptors / Indicators: Discomfort Pain Intervention(s): Limited activity within patient's tolerance;Monitored during session;Repositioned           PT Goals (current goals can now be found in the care plan section) Acute Rehab PT Goals Patient Stated Goal: to be less wobbly PT Goal Formulation: With patient/family Time For Goal Achievement: 05/09/18 Potential to Achieve Goals: Good Progress towards PT  goals: Progressing toward goals    Frequency    Min 3X/week      PT Plan Discharge plan needs to be updated       AM-PAC PT "6 Clicks" Mobility   Outcome Measure  Help needed turning from your back to your side while in a flat bed without using bedrails?: A Lot Help needed moving from lying on your back to sitting on the side of a flat bed without using bedrails?: A Lot Help needed moving to and from a bed to a chair (including a wheelchair)?: A  Lot Help needed standing up from a chair using your arms (e.g., wheelchair or bedside chair)?: A Lot Help needed to walk in hospital room?: A Lot Help needed climbing 3-5 steps with a railing? : A Lot 6 Click Score: 12    End of Session Equipment Utilized During Treatment: Gait belt Activity Tolerance: Patient tolerated treatment well Patient left: in chair;with family/visitor present;with call bell/phone within reach Nurse Communication: Mobility status PT Visit Diagnosis: History of falling (Z91.81);Other abnormalities of gait and mobility (R26.89);Pain Pain - Right/Left: Right Pain - part of body: Arm     Time: 8887-5797 PT Time Calculation (min) (ACUTE ONLY): 21 min  Charges:  $Gait Training: 8-22 mins                     Jack Bolio B. Migdalia Dk PT, DPT Acute Rehabilitation Services Pager [phone] Office [phone]    [place] 05/04/2018, 2:37 PM

## 2018-05-04 NOTE — Progress Notes (Addendum)
CSW lvm with [facility] as they have read referral in hub but have not made a decision to decline or accept patient.   CSW awaiting to hear back, concerns regarding patient needing longer term care may be barriers to patient being accepted to a short term rehab [place].   [place], [place]

## 2018-05-04 NOTE — Progress Notes (Signed)
PROGRESS NOTE    Michele Lambert  YPP:[id number] DOB: [birth year] [name]: 05/01/2018 PCP: Dion Body, MD   Brief Narrative:  Michele Lambert is Michele Lambert 85 y.o. female with medical history significant for Hypothyroidism and Dementia who presents to the ED after an unwitnessed fall at home.  History is limited from patient due to underlying dementia, and therefore majority of history is obtained from family at bedside and chart review.  Patient apparently had an unwitnessed fall in her garage sometime between 2-4 PM on 05/01/2018.  She was at her baseline when her home caregiver left the house around 2 PM.  Patient's grandson went to see her around 4 PM and found her lying on the ground of the garage with her right arm extended out and above her head in an odd angle.    Patient herself currently denies any chest pain, palpitations, lightheadedness, dizziness, nausea, vomiting, dyspnea, abdominal pain, diarrhea, constipation, dysuria.  She reports some pain in her right arm and right eyebrow.  Per family, at baseline patient is fairly high functioning and ambulates without assist but does have underlying dementia with occasional episodes of confusion and difficulty with recall  Assessment & Plan:   Principal Problem:   Displaced fracture of right humerus Active Problems:   Hypothyroidism   Dementia ([place])   Unwitnessed fall   Fracture of distal humerus   Acute oblique overriding and displaced fracture of the right distal humerus: Occurred after unwitnessed fall.  -S/p open reduction internal fixation distal humerus fracture on 1/29 -Pain control with scheduled APAP, prn oxycodone -OOB Per orthopedics Weightbearing: Less than 5 lb WB RUE, ROM as tolerated Insicional and dressing care: Remove dressings POD7 Orthopedic device(s): None Showering: prn, bulky dressing can be removed prior but aquacel stays in place VTE prophylaxis: Per primary Pain control: prn meds, minimize narcotics Follow - up  plan: 1 week  Unwitnessed fall: Patient does not recall any of the conditions surrounding the event.  She is reportedly freely mobile at baseline without the need to use assistive devices. -Monitor on telemetry -PT/OT eval after surgery - PT now recommending SNF -Fall precautions -Gentle IV fluids overnight  Hypertension: some elevated BP here, pt not on BP meds at home, imrpoved this morning but still elevated.  Will ctm closely, consider adding low dose antihypertensive if persistent.  Continue to treat pain.  Hypothyroidism: Last TSH 2.972, free T4 1.06 on 03/30/2018. -Continue Synthroid  Dementia: -Delirium precautions  Urinary Retention - passed TOV yesterday  DVT prophylaxis: lovenox Code Status: full code Family Communication: son at bedside Disposition Plan: inpatient given advanced age in setting of distal humerus fracture, in addition, she does not currently have Michele Lambert safe discharge disposition with her increased unsteadiness and difficulty with ambulation when working with PT.  She was living independently prior to admission and does not have 24/7 supervision available.   Consultants:   orthopedics  Procedures:   none  Antimicrobials:  Anti-infectives (From admission, onward)   Start     Dose/Rate Route Frequency Ordered Stop   05/02/18 1630  ceFAZolin (ANCEF) IVPB 1 g/50 mL premix     1 g 100 mL/hr over 30 Minutes Intravenous Every 8 hours 05/02/18 1628 05/02/18 2204   05/02/18 1230  ceFAZolin (ANCEF) IVPB 2g/100 mL premix     2 g 200 mL/hr over 30 Minutes Intravenous On call to O.R. 05/02/18 1216 05/02/18 1322      Subjective: No complaints today. Michele Lambert&Ox1 today. Son at bedside.  Objective: Vitals:  05/03/18 1009 05/03/18 1316 05/03/18 1952 05/04/18 0429  BP: (!) 174/78 138/67 (!) 171/92 (!) 153/70  Pulse: 82 86 87 78  Resp:   14   Temp: 98.4 F (36.9 C)  98.7 F (37.1 C) 98.6 F (37 C)  TempSrc: Oral  Oral Oral  SpO2: 98%  97% 100%  Weight:       Height:        Intake/Output Summary (Last 24 hours) at 05/04/2018 1101 Last data filed at 05/04/2018 0441 Gross per 24 hour  Intake 323 ml  Output 900 ml  Net -577 ml   Filed Weights   05/02/18 0100 05/02/18 0131  Weight: 52 kg 52 kg    Examination:  General: No acute distress. Cardiovascular: Heart sounds show Leather Estis regular rate, and rhythm. Lungs: Clear to auscultation bilaterally  Abdomen: Soft, nontender, nondistended  Neurological: Alert and oriented 1. Moves all extremities 4. Cranial nerves II through XII grossly intact. Extremities: Bruising to RUE, RUE swelling Psychiatric: Mood and affect are normal. Insight and judgment are impaired.   Data Reviewed: I have personally reviewed following labs and imaging studies  CBC: Recent Labs  Lab 05/01/18 2002 05/02/18 0330 05/03/18 0656 05/04/18 0316  WBC 17.6* 11.4* 12.7* 11.4*  NEUTROABS 15.6*  --   --   --   HGB 12.4 10.8* 11.0* 9.7*  HCT 38.3 33.9* 34.4* 30.8*  MCV 91.0 90.2 90.1 90.3  PLT 198 129* 172 PLATELET CLUMPS NOTED ON SMEAR, UNABLE TO ESTIMATE   Basic Metabolic Panel: Recent Labs  Lab 05/01/18 2002 05/02/18 0330 05/03/18 0656 05/04/18 0316  NA 139 140 141 142  K 3.8 3.9 4.3 4.0  CL 105 107 109 108  CO2 25 25 26 27   GLUCOSE 125* 126* 94 99  BUN 32* 30* 19 18  CREATININE 0.99 0.91 1.03* 0.98  CALCIUM 8.8* 8.6* 8.4* 8.6*  MG  --   --  2.0 1.9   GFR: Estimated Creatinine Clearance: 28 mL/min (by C-G formula based on SCr of 0.98 mg/dL). Liver Function Tests: Recent Labs  Lab 05/04/18 0316  AST 27  ALT 11  ALKPHOS 30*  BILITOT 0.6  PROT 5.5*  ALBUMIN 3.0*   No results for input(s): LIPASE, AMYLASE in the last 168 hours. No results for input(s): AMMONIA in the last 168 hours. Coagulation Profile: No results for input(s): INR, PROTIME in the last 168 hours. Cardiac Enzymes: Recent Labs  Lab 05/01/18 2002  CKTOTAL 279*   BNP (last 3 results) No results for input(s): PROBNP in the  last 8760 hours. HbA1C: No results for input(s): HGBA1C in the last 72 hours. CBG: No results for input(s): GLUCAP in the last 168 hours. Lipid Profile: No results for input(s): CHOL, HDL, LDLCALC, TRIG, CHOLHDL, LDLDIRECT in the last 72 hours. Thyroid Function Tests: No results for input(s): TSH, T4TOTAL, FREET4, T3FREE, THYROIDAB in the last 72 hours. Anemia Panel: No results for input(s): VITAMINB12, FOLATE, FERRITIN, TIBC, IRON, RETICCTPCT in the last 72 hours. Sepsis Labs: No results for input(s): PROCALCITON, LATICACIDVEN in the last 168 hours.  Recent Results (from the past 240 hour(s))  Surgical pcr screen     Status: None   Collection Time: 05/02/18  1:50 AM  Result Value Ref Range Status   MRSA, PCR NEGATIVE NEGATIVE Final   Staphylococcus aureus NEGATIVE NEGATIVE Final    Comment: (NOTE) The Xpert SA Assay (FDA approved for NASAL specimens in patients 34 years of age and older), is one component of Kyleigh Nannini comprehensive surveillance  program. It is not intended to diagnose infection nor to guide or monitor treatment. Performed at [facility], [place] [address], [place], [place] [address]          Radiology Studies: Dg Elbow 2 Views Right  Result Date: 05/02/2018 CLINICAL DATA:  Right distal humerus ORIF. EXAM: RIGHT ELBOW - 2 VIEW; DG C-ARM 61-120 MIN COMPARISON:  Right humerus x-rays from yesterday. FLUOROSCOPY TIME:  28 seconds. C-arm fluoroscopic images were obtained intraoperatively and submitted for post operative interpretation. FINDINGS: Frontal lateral intraoperative fluoroscopic images demonstrate interval posterior plate and screw fixation of the distal humeral diaphyseal fracture. Alignment is now anatomic. The elbow joint is unremarkable. IMPRESSION: 1. Intraoperative fluoroscopic guidance for distal humerus ORIF, now in anatomic alignment. Electronically Signed   By: Titus Dubin M.D.   On: 05/02/2018 15:05   Dg Humerus Right  Result Date:  05/02/2018 CLINICAL DATA:  ORIF right humerus. EXAM: RIGHT HUMERUS - 2+ VIEW COMPARISON:  05/02/2018 intraoperative images. FINDINGS: Screw and plate fixation of the distal right humerus, in anatomic alignment. Expected postoperative soft tissue gas. IMPRESSION: Screw and plate fixation of the distal right humerus, in anatomic alignment. Electronically Signed   By: Ulyses Jarred M.D.   On: 05/02/2018 15:59   Dg C-arm 1-60 Min  Result Date: 05/02/2018 CLINICAL DATA:  Right distal humerus ORIF. EXAM: RIGHT ELBOW - 2 VIEW; DG C-ARM 61-120 MIN COMPARISON:  Right humerus x-rays from yesterday. FLUOROSCOPY TIME:  28 seconds. C-arm fluoroscopic images were obtained intraoperatively and submitted for post operative interpretation. FINDINGS: Frontal lateral intraoperative fluoroscopic images demonstrate interval posterior plate and screw fixation of the distal humeral diaphyseal fracture. Alignment is now anatomic. The elbow joint is unremarkable. IMPRESSION: 1. Intraoperative fluoroscopic guidance for distal humerus ORIF, now in anatomic alignment. Electronically Signed   By: Titus Dubin M.D.   On: 05/02/2018 15:05        Scheduled Meds: . acetaminophen  650 mg Oral Q6H  . calcium carbonate  1 tablet Oral Daily  . heparin  5,000 Units Subcutaneous Q8H  . levothyroxine  88 mcg Oral QAC breakfast  . sodium chloride flush  3 mL Intravenous Q12H   Continuous Infusions:    LOS: 1 day    Time spent: over 30 min    Fayrene Helper, MD [facility]   If 7PM-7AM, please contact night-coverage [email or web address] Password [email or web address] 05/04/2018, 11:01 AM

## 2018-05-05 LAB — BASIC METABOLIC PANEL
Anion gap: 9 (ref 5–15)
BUN: 15 mg/dL (ref 8–23)
CO2: 25 mmol/L (ref 22–32)
CREATININE: 0.89 mg/dL (ref 0.44–1.00)
Calcium: 9.1 mg/dL (ref 8.9–10.3)
Chloride: 108 mmol/L (ref 98–111)
GFR calc Af Amer: >60 mL/min (ref >60)
GFR calc non Af Amer: 57 mL/min — ABNORMAL LOW (ref >60)
Glucose, Bld: 103 mg/dL — ABNORMAL HIGH (ref 70–99)
Potassium: 3.5 mmol/L (ref 3.5–5.1)
Sodium: 142 mmol/L (ref 135–145)

## 2018-05-05 LAB — CBC
HCT: 33.9 % — ABNORMAL LOW (ref 36.0–46.0)
Hemoglobin: 10.8 g/dL — ABNORMAL LOW (ref 12.0–15.0)
MCH: 28.6 pg (ref 26.0–34.0)
MCHC: 31.9 g/dL (ref 30.0–36.0)
MCV: 89.7 fL (ref 80.0–100.0)
Platelets: UNDETERMINED 10*3/uL (ref 150–400)
RBC: 3.78 MIL/uL — ABNORMAL LOW (ref 3.87–5.11)
RDW: 14.3 % (ref 11.5–15.5)
WBC: 12 10*3/uL — ABNORMAL HIGH (ref 4.0–10.5)
nRBC: 0 % (ref 0.0–0.2)

## 2018-05-05 LAB — MAGNESIUM: Magnesium: 2 mg/dL (ref 1.7–2.4)

## 2018-05-05 MED ORDER — TRAZODONE HCL 50 MG PO TABS
50.0000 mg | ORAL_TABLET | Freq: Every day | ORAL | Status: DC
  Administered 2018-05-05: 50 mg via ORAL
  Filled 2018-05-05: qty 1

## 2018-05-05 NOTE — Progress Notes (Signed)
SPORTS MEDICINE AND JOINT REPLACEMENT  Lara Mulch, MD    Carlyon Shadow, PA-C [place], [place], [place]  [address]                             [phone]   PROGRESS NOTE  Subjective:  negative for Chest Pain  negative for Shortness of Breath  negative for Nausea/Vomiting   negative for Calf Pain  negative for Bowel Movement   Tolerating Diet: yes         Patient reports pain as 3 on 0-10 scale.    Objective: Vital signs in last 24 hours:    Patient Vitals for the past 24 hrs:  BP Temp Temp src Pulse Resp SpO2  05/05/18 0525 (!) 152/84 97.9 F (36.6 C) - 89 14 100 %  05/04/18 2053 (!) 158/83 98.7 F (37.1 C) Oral 78 14 98 %  05/04/18 1357 (!) 158/70 98.5 F (36.9 C) Oral 77 17 99 %    @flow {1959:LAST@   Intake/Output from previous day:   01/31 0701 - 02/01 0700 In: 720 [P.O.:720] Out: 303 [Urine:303]   Intake/Output this shift:   No intake/output data recorded.   Intake/Output      01/31 0701 - 02/01 0700 02/01 0701 - 02/02 0700   P.O. 720    I.V. (mL/kg)     Total Intake(mL/kg) 720 (13.8)    Urine (mL/kg/hr) 303 (0.2)    Total Output 303    Net +417            LABORATORY DATA: Recent Labs    05/01/18 2002 05/02/18 0330 05/03/18 0656 05/04/18 0316 05/05/18 0513  WBC 17.6* 11.4* 12.7* 11.4* 12.0*  HGB 12.4 10.8* 11.0* 9.7* 10.8*  HCT 38.3 33.9* 34.4* 30.8* 33.9*  PLT 198 129* 172 PLATELET CLUMPS NOTED ON SMEAR, UNABLE TO ESTIMATE PLATELET CLUMPS NOTED ON SMEAR, UNABLE TO ESTIMATE   Recent Labs    05/01/18 2002 05/02/18 0330 05/03/18 0656 05/04/18 0316 05/05/18 0513  NA 139 140 141 142 142  K 3.8 3.9 4.3 4.0 3.5  CL 105 107 109 108 108  CO2 25 25 26 27 25   BUN 32* 30* 19 18 15   CREATININE 0.99 0.91 1.03* 0.98 0.89  GLUCOSE 125* 126* 94 99 103*  CALCIUM 8.8* 8.6* 8.4* 8.6* 9.1   No results found for: INR, PROTIME    Assessment:    3 Days Post-Op  Procedure(s) (LRB): OPEN REDUCTION INTERNAL FIXATION (ORIF) DISTAL  HUMERUS FRACTURE (Right)  ADDITIONAL DIAGNOSIS:  Principal Problem:   Displaced fracture of right humerus Active Problems:   Hypothyroidism   Dementia (HCC)   Unwitnessed fall   Fracture of distal humerus   Doing well postoperatively, difficult to examine and communicate due to dementia  Plan: Physical Therapy as ordered 5 lb lift restriction  DVT Prophylaxis:  per primary  Ok to D/C from [place] 05/05/2018, 8:34 AM

## 2018-05-05 NOTE — Progress Notes (Signed)
Occupational Therapy Treatment Patient Details Name: Michele Lambert MRN: [id number] DOB: 1927-03-23 Today's Date: 05/05/2018    History of present illness Michele Lambert is a 85 y.o. female with medical history significant for Hypothyroidism and Dementia who presents to the ED after an unwitnessed fall at home. Radiographs revealed a comminuted right supracondylar elbow fracture. Pt is now s/p ORIF on 1/29.    OT comments  Pt. Seen for skilled OT session.  Pt. Able to complete bed mobility and toileting tasks mod a HHA.  Pt. Remains with notable confusion and is R hand dominant making if very difficult to maintain NWB precautions without max verbal and tactile guidance.  Family present and educated on benefits of wearing sling during functional mobility to aide in prevention of use of R ue.  Also reviewed ROM handouts with pts. Son.   Follow Up Recommendations  SNF;Supervision/Assistance - 24 hour    Equipment Recommendations  None recommended by OT    Recommendations for Other Services Other (comment)    Precautions / Restrictions Precautions Precautions: Fall Required Braces or Orthoses: Sling(family reports they were told sling not required) Restrictions RUE Weight Bearing: Non weight bearing       Mobility Bed Mobility Overal bed mobility: Needs Assistance Bed Mobility: Supine to Sit     Supine to sit: Max assist        Transfers Overall transfer level: Needs assistance Equipment used: 1 person hand held assist Transfers: Sit to/from [facility] Sit to Stand: Mod assist Stand pivot transfers: Mod assist            Balance                                           ADL either performed or assessed with clinical judgement   ADL Overall ADL's : Needs assistance/impaired     Grooming: Moderate assistance;Wash/dry hands;Sitting                   Armed forces technical officer: Moderate assistance;Maximal assistance;Regular  Toilet;Ambulation Toilet Transfer Details (indicate cue type and reason): 1 person HHA Toileting- Clothing Manipulation and Hygiene: Sitting/lateral lean;Maximal assistance Toileting - Clothing Manipulation Details (indicate cue type and reason): pt. unable to wipe with L hand, kept trying to reach and use R hand.      Functional mobility during ADLs: Moderate assistance General ADL Comments: discussed with pts family rec. for using sling during functional tasks to aide in prevention of use. pt. is R hand dominant and notable confusion limiting her ability to not engage in use of it.  reviewed rom handouts with pt. also     Vision       Perception     Praxis      Cognition Arousal/Alertness: Awake/alert Behavior During Therapy: Impulsive Overall Cognitive Status: History of cognitive impairments - at baseline                                          Exercises Other Exercises Other Exercises: digits - open and close fist Other Exercises: shoulder abduction/FF Other Exercises: reviewed rom handouts wtih pts. son   Shoulder Instructions       General Comments      Pertinent Vitals/ Pain       Pain Assessment:  No/denies pain  Home Living                                          Prior Functioning/Environment              Frequency  Min 3X/week        Progress Toward Goals  OT Goals(current goals can now be found in the care plan section)  Progress towards OT goals: Progressing toward goals     Plan Discharge plan needs to be updated    Co-evaluation                 AM-PAC OT "6 Clicks" Daily Activity     Outcome Measure   Help from another person eating meals?: A Lot Help from another person taking care of personal grooming?: A Lot Help from another person toileting, which includes using toliet, bedpan, or urinal?: A Lot Help from another person bathing (including washing, rinsing, drying)?: A Lot Help from  another person to put on and taking off regular upper body clothing?: A Lot Help from another person to put on and taking off regular lower body clothing?: A Lot 6 Click Score: 12    End of Session Equipment Utilized During Treatment: Gait belt  OT Visit Diagnosis: History of falling (Z91.81);Pain Pain - Right/Left: Right Pain - part of body: Arm   Activity Tolerance Patient tolerated treatment well   Patient Left in chair;with family/visitor present;Other (comment)(family requested NO CHAIR ALARM. reviewed at length they HAVE to notify rn staff before leaving the room. they verbalized understanding)   Nurse Communication Other (comment)(alerted rn and cna chair alarm off per family request and they have been educated they are not to leave the room until chair alarm reactivated for pt. safety. )        Time: 1594-5859 OT Time Calculation (min): 19 min  Charges: OT General Charges $OT Visit: 1 Visit OT Treatments $Self Care/Home Management : 8-22 mins     Janice Coffin, COTA/L 05/05/2018, 9:50 AM

## 2018-05-05 NOTE — Progress Notes (Addendum)
PROGRESS NOTE    Michele Lambert  NLG:[id number] DOB: [birth year] [name]: 05/01/2018 PCP: Dion Body, MD   Brief Narrative:  Michele Lambert is a 85 y.o. female with medical history significant for Hypothyroidism and Dementia who presents to the ED after an unwitnessed fall at home.  History is limited from patient due to underlying dementia, and therefore majority of history is obtained from family at bedside and chart review.  Patient apparently had an unwitnessed fall in her garage sometime between 2-4 PM on 05/01/2018.  She was at her baseline when her home caregiver left the house around 2 PM.  Patient's grandson went to see her around 4 PM and found her lying on the ground of the garage with her right arm extended out and above her head in an odd angle.    Patient herself currently denies any chest pain, palpitations, lightheadedness, dizziness, nausea, vomiting, dyspnea, abdominal pain, diarrhea, constipation, dysuria.  She reports some pain in her right arm and right eyebrow.  Per family, at baseline patient is fairly high functioning and ambulates without assist but does have underlying dementia with occasional episodes of confusion and difficulty with recall  Assessment & Plan:   Principal Problem:   Displaced fracture of right humerus Active Problems:   Hypothyroidism   Dementia ([place])   Unwitnessed fall   Fracture of distal humerus   Acute oblique overriding and displaced fracture of the right distal humerus: Occurred after unwitnessed fall.  -S/p open reduction internal fixation distal humerus fracture on 1/29 -Pain control with scheduled APAP, prn oxycodone -OOB Per orthopedics Weightbearing: Less than 5 lb WB RUE, ROM as tolerated Insicional and dressing care: Remove dressings POD7 Orthopedic device(s): None Showering: prn, bulky dressing can be removed prior but aquacel stays in place VTE prophylaxis: Per primary Pain control: prn meds, minimize narcotics Follow - up  plan: 1 week  Unwitnessed fall: Patient does not recall any of the conditions surrounding the event.  She is reportedly freely mobile at baseline without the need to use assistive devices. -Monitor on telemetry -PT/OT eval after surgery - PT now recommending SNF -Fall precautions -Gentle IV fluids overnight  Dementia  Acute Encephalopathy: - Pt delirious, A&OX1, confused.  Suspect 2/2 acute hospitalization, delirium in setting of dementia.  Will continue to monitor, w/u additionally as indicated.   -Delirium precautions -OOB, busy blanket -trazodone nightly   Hypertension: some elevated BP here, pt not on BP meds at home.  Noted to the 150's today. Will continue to monitor.  Consider adding low dose antihypertensive if persistent.  Continue to treat pain.  Hypothyroidism: Last TSH 2.972, free T4 1.06 on 03/30/2018. -Continue Synthroid  Urinary Retention - passed TOV   Addendum.  PT/OT evaluations performed. SNF recommended. SNF appropriate as is felt to need rehab services to restore this patient to their prior level of function to achieve safe transition back to home care. This patient needs rehab services for at least 5 days per week and skilled nursing services daily to facilitate this transition. Rehab is being requested as the most appropriate d/c option for this patient and is NOT felt to be for custodial care as evidenced by the patient being noted to be fairly high functioning prior to admission, ambulating without assist at home prior to this admission.  DVT prophylaxis: lovenox Code Status: full code Family Communication: daughter at bedside Disposition Plan: inpatient given advanced age in setting of distal humerus fracture, in addition, she does not currently have a safe  discharge disposition with her increased unsteadiness and difficulty with ambulation when working with PT.  She was living independently prior to admission and does not have 24/7 supervision available.    Consultants:   orthopedics  Procedures:   none  Antimicrobials:  Anti-infectives (From admission, onward)   Start     Dose/Rate Route Frequency Ordered Stop   05/02/18 1630  ceFAZolin (ANCEF) IVPB 1 g/50 mL premix     1 g 100 mL/hr over 30 Minutes Intravenous Every 8 hours 05/02/18 1628 05/02/18 2204   05/02/18 1230  ceFAZolin (ANCEF) IVPB 2g/100 mL premix     2 g 200 mL/hr over 30 Minutes Intravenous On call to O.R. 05/02/18 1216 05/02/18 1322      Subjective: No complaints A&Ox1 Daughter at bedside, discussed delirium  Objective: Vitals:   05/04/18 0429 05/04/18 1357 05/04/18 2053 05/05/18 0525  BP: (!) 153/70 (!) 158/70 (!) 158/83 (!) 152/84  Pulse: 78 77 78 89  Resp:  17 14 14   Temp: 98.6 F (37 C) 98.5 F (36.9 C) 98.7 F (37.1 C) 97.9 F (36.6 C)  TempSrc: Oral Oral Oral   SpO2: 100% 99% 98% 100%  Weight:      Height:        Intake/Output Summary (Last 24 hours) at 05/05/2018 0830 Last data filed at 05/05/2018 0600 Gross per 24 hour  Intake 720 ml  Output 303 ml  Net 417 ml   Filed Weights   05/02/18 0100 05/02/18 0131  Weight: 52 kg 52 kg    Examination:  General: No acute distress. Cardiovascular: Heart sounds show a regular rate, and rhythm.  Lungs: Clear to auscultation bilaterally Abdomen: Soft, nontender, nondistended Neurological: Alert and oriented 1. Moves all extremities 4. Cranial nerves II through XII grossly intact. Skin: Warm and dry. No rashes or lesions. Extremities: Mild RUE swelling Psychiatric: Mood and affect are normal. Insight and judgment are impaired.   Data Reviewed: I have personally reviewed following labs and imaging studies  CBC: Recent Labs  Lab 05/01/18 2002 05/02/18 0330 05/03/18 0656 05/04/18 0316 05/05/18 0513  WBC 17.6* 11.4* 12.7* 11.4* 12.0*  NEUTROABS 15.6*  --   --   --   --   HGB 12.4 10.8* 11.0* 9.7* 10.8*  HCT 38.3 33.9* 34.4* 30.8* 33.9*  MCV 91.0 90.2 90.1 90.3 89.7  PLT 198 129* 172  PLATELET CLUMPS NOTED ON SMEAR, UNABLE TO ESTIMATE PLATELET CLUMPS NOTED ON SMEAR, UNABLE TO ESTIMATE   Basic Metabolic Panel: Recent Labs  Lab 05/01/18 2002 05/02/18 0330 05/03/18 0656 05/04/18 0316 05/05/18 0513  NA 139 140 141 142 142  K 3.8 3.9 4.3 4.0 3.5  CL 105 107 109 108 108  CO2 25 25 26 27 25   GLUCOSE 125* 126* 94 99 103*  BUN 32* 30* 19 18 15   CREATININE 0.99 0.91 1.03* 0.98 0.89  CALCIUM 8.8* 8.6* 8.4* 8.6* 9.1  MG  --   --  2.0 1.9 2.0   GFR: Estimated Creatinine Clearance: 30.8 mL/min (by C-G formula based on SCr of 0.89 mg/dL). Liver Function Tests: Recent Labs  Lab 05/04/18 0316  AST 27  ALT 11  ALKPHOS 30*  BILITOT 0.6  PROT 5.5*  ALBUMIN 3.0*   No results for input(s): LIPASE, AMYLASE in the last 168 hours. No results for input(s): AMMONIA in the last 168 hours. Coagulation Profile: No results for input(s): INR, PROTIME in the last 168 hours. Cardiac Enzymes: Recent Labs  Lab 05/01/18 2002  CKTOTAL 279*  BNP (last 3 results) No results for input(s): PROBNP in the last 8760 hours. HbA1C: No results for input(s): HGBA1C in the last 72 hours. CBG: No results for input(s): GLUCAP in the last 168 hours. Lipid Profile: No results for input(s): CHOL, HDL, LDLCALC, TRIG, CHOLHDL, LDLDIRECT in the last 72 hours. Thyroid Function Tests: No results for input(s): TSH, T4TOTAL, FREET4, T3FREE, THYROIDAB in the last 72 hours. Anemia Panel: No results for input(s): VITAMINB12, FOLATE, FERRITIN, TIBC, IRON, RETICCTPCT in the last 72 hours. Sepsis Labs: No results for input(s): PROCALCITON, LATICACIDVEN in the last 168 hours.  Recent Results (from the past 240 hour(s))  Surgical pcr screen     Status: None   Collection Time: 05/02/18  1:50 AM  Result Value Ref Range Status   MRSA, PCR NEGATIVE NEGATIVE Final   Staphylococcus aureus NEGATIVE NEGATIVE Final    Comment: (NOTE) The Xpert SA Assay (FDA approved for NASAL specimens in patients 105 years  of age and older), is one component of a comprehensive surveillance program. It is not intended to diagnose infection nor to guide or monitor treatment. Performed at [facility], [place] [address], [place], [place] [address]          Radiology Studies: No results found.      Scheduled Meds: . acetaminophen  650 mg Oral Q6H  . calcium carbonate  1 tablet Oral Daily  . enoxaparin (LOVENOX) injection  30 mg Subcutaneous Q24H  . levothyroxine  88 mcg Oral QAC breakfast  . sodium chloride flush  3 mL Intravenous Q12H  . traZODone  50 mg Oral QHS   Continuous Infusions:    LOS: 2 days    Time spent: over 30 min    Fayrene Helper, MD [facility]   If 7PM-7AM, please contact night-coverage [email or web address] Password [email or web address] 05/05/2018, 8:30 AM

## 2018-05-06 LAB — BASIC METABOLIC PANEL
ANION GAP: 9 (ref 5–15)
BUN: 24 mg/dL — ABNORMAL HIGH (ref 8–23)
CO2: 26 mmol/L (ref 22–32)
Calcium: 8.8 mg/dL — ABNORMAL LOW (ref 8.9–10.3)
Chloride: 107 mmol/L (ref 98–111)
Creatinine, Ser: 1.07 mg/dL — ABNORMAL HIGH (ref 0.44–1.00)
GFR calc Af Amer: 53 mL/min — ABNORMAL LOW (ref >60)
GFR calc non Af Amer: 46 mL/min — ABNORMAL LOW (ref >60)
GLUCOSE: 89 mg/dL (ref 70–99)
Potassium: 4 mmol/L (ref 3.5–5.1)
Sodium: 142 mmol/L (ref 135–145)

## 2018-05-06 LAB — CBC
HCT: 34.1 % — ABNORMAL LOW (ref 36.0–46.0)
Hemoglobin: 11.1 g/dL — ABNORMAL LOW (ref 12.0–15.0)
MCH: 29.8 pg (ref 26.0–34.0)
MCHC: 32.6 g/dL (ref 30.0–36.0)
MCV: 91.4 fL (ref 80.0–100.0)
Platelets: UNDETERMINED 10*3/uL (ref 150–400)
RBC: 3.73 MIL/uL — ABNORMAL LOW (ref 3.87–5.11)
RDW: 14.5 % (ref 11.5–15.5)
WBC: 9.5 10*3/uL (ref 4.0–10.5)
nRBC: 0 % (ref 0.0–0.2)

## 2018-05-06 LAB — MAGNESIUM: Magnesium: 2.1 mg/dL (ref 1.7–2.4)

## 2018-05-06 MED ORDER — POLYETHYLENE GLYCOL 3350 17 G PO PACK: 17.0000 g | PACK | Freq: Every day | ORAL | 0 refills | Status: DC

## 2018-05-06 MED ORDER — POLYETHYLENE GLYCOL 3350 17 G PO PACK
17.0000 g | PACK | Freq: Every day | ORAL | Status: DC
  Administered 2018-05-06: 17 g via ORAL
  Filled 2018-05-06: qty 1

## 2018-05-06 MED ORDER — TRAZODONE HCL 50 MG PO TABS: 50.0000 mg | ORAL_TABLET | Freq: Every evening | ORAL | 0 refills | Status: DC | PRN

## 2018-05-06 MED ORDER — ACETAMINOPHEN 325 MG PO TABS: 650.0000 mg | ORAL_TABLET | Freq: Four times a day (QID) | ORAL | 0 refills | Status: AC

## 2018-05-06 NOTE — Clinical Social Work Placement (Signed)
   Patient has bed at [facility].   LCSW confirmed bed with facility.   LCSW faxed dc documents to facility.   Family will transport patient by car.   RN report # [phone] [phone]  [facility]  CLINICAL SOCIAL WORK PLACEMENT  NOTE  Date:  05/06/2018  Patient Details  Name: Michele Lambert MRN: [id number] Date of Birth: 1926/12/03  Clinical Social Work is seeking post-discharge placement for this patient at the [place] level of care (*CSW will initial, date and re-position this form in  chart as items are completed):  Yes   Patient/family provided with [facility] Work Department's list of facilities offering this level of care within the geographic area requested by the patient (or if unable, by the patient's family).  Yes   Patient/family informed of their freedom to choose among providers that offer the needed level of care, that participate in Medicare, Medicaid or managed care program needed by the patient, have an available bed and are willing to accept the patient.  Yes   Patient/family informed of [facility]'s ownership interest in [facility] and [facility], as well as of the fact that they are under no obligation to receive care at these facilities.  PASRR submitted to EDS on       PASRR number received on 05/04/18     Existing PASRR number confirmed on       FL2 transmitted to all facilities in geographic area requested by pt/family on 05/04/18     FL2 transmitted to all facilities within larger geographic area on       Patient informed that his/her managed care company has contracts with or will negotiate with certain facilities, including the following:        Yes   Patient/family informed of bed offers received.  Patient chooses bed at [facility]     Physician recommends and patient chooses bed at      Patient to be transferred to [facility] on 05/06/18.  Patient to be transferred to facility by Private Car/ Family      Patient family notified on 05/06/18 of transfer.  Name of family member notified:  Family at bedside     PHYSICIAN       Additional Comment:    _______________________________________________ Servando Snare, LCSW 05/06/2018, 11:42 AM

## 2018-05-06 NOTE — Progress Notes (Signed)
AVS given and reviewed with pt's son. Medications reviewed and discussed. All questions answered to satisfaction. Sling in place. Report called and given to [employer], Therapist, sports at [employer]. All questions answered to satisfaction. Awaiting pt's family for transportation. Will continue to monitor.

## 2018-05-06 NOTE — Discharge Summary (Signed)
**Note De-Identified vi Obfusction** Physicin Dischrge [facility]mry  Michele Lambert ZOX:[id number] DOB: 1926/07/14 DO: 05/01/2018  PCP: Dion Body, MD  dmit dte: 05/01/2018 Dischrge dte: 05/06/2018  Time spent: 40 minutes  Recommendtions for Outptient Follow-up:  1. Follow up outptient CBC/CMP 2. Follow up with orthopedics s outptient - see ortho instructions below - 5 lbs weight restriction 3. Continue scheduled PP for the next 5 dys, then trnsition to tylenol s needed 4. Continue trzodone nightly flor sleep 5. Delirium precutions for dementi   Dischrge Dignoses:  Principl Problem:   Displced frcture of right humerus ctive Problems:   Hypothyroidism   Dementi (lm)   Unwitnessed fll   Frcture of distl humerus   Dischrge Condition: stble  Diet recommendtion: hert helthy  Filed Weights   05/02/18 0100 05/02/18 0131  Weight: 52 kg 52 kg    History of present illness:  Michele L Owenis  85 y.o.femlewith medicl history significnt forHypothyroidism nd Dementiwho presents to the EDfter nunwitnessed fll t home.History is limited from ptient due to underlying dementi,nd therefore mjority of history is obtined from fmily t bedside nd chrt review. Ptient pprently hd n unwitnessed fll in her grge sometime between 2-4PM on 05/01/2018. She ws t her bseline when her home cregiver left the house round 2 PM. Ptient's grndson went to see her round 4 PM nd found her lying on the ground of the grge with her right rm extended out nd bove her hed in n odd ngle.   Ptient herself currently denies ny chest pin, plpittions, lighthededness, dizziness, nuse, vomiting, dyspne, bdominl pin, dirrhe, constiption, dysuri.She reports some pin in her right rm nd right eyebrow.  Per fmily, t bseline ptient is firly high functioning nd mbultes without ssist but does hve underlying dementi with occsionl episodes of confusion nd  difficulty with recll  She ws dmitted for  right distl humerus frcture.  She underwent open reduction nd internl fixtion per orthopedics on 1/29.  She ws seen by PT/OT who recommended SNF plcement.  She ws stble on the dy of dischrge.  See below for dditionl detils  Hospitl Course:  cute oblique overriding nd displced frcture of the right distl humerus: Occurred fter unwitnessed fll.  -S/p open reduction internl fixtion distl humerus frcture on 1/29 -Pin control with scheduled PP Per orthopedics Weightbering:Less thn 5 lb WB RUE, ROM s tolerted Insicionl nd dressing cre:Remove dressings POD7 Orthopedic device(s):None Showering:prn, bulky dressing cn be removed prior but qucel stys in plce VTE prophylxis:Per primry Pin control:prn meds, minimize nrcotics Follow - up pln:1 week  Unwitnessed fll: Ptient does not recll ny of the conditionssurrounding the event. She is reportedly freely mobile t bseline without the need to use ssistive devices. -PT/OT evl fter surgery - PT recommending SNF -Fll precutions  Dementi  cute Encephlopthy: - Pt still &Ox1 nd confused, but improving.  Continue delirium precutions.  Continue trzodone nightly for sleep.  Bowel regimen.   - Continue to follow t dischrge  Hypertension: some elevted BP here, pt not on BP meds t home.  BP 120's over 70's this morning.  Continue to follow outptient.  Hypothyroidism: Lst TSH 2.972, free T4 1.06 on 03/30/2018. -Continue Synthroid  Urinry Retention - pssed TOV   PT/OT evlutions performed. SNF recommended. SNF pproprite s is felt to need rehb services to restore this ptient to their prior level of function to chieve sfe trnsition bck to home cre. This ptient needs rehb services for t lest 5 dys per week nd skilled nursing **Note De-Identified vi Obfusction** services dily to fcilitte this trnsition. Rehb is being requested s the most  pproprite d/c option for this ptient nd is NOT felt to be for custodil cre s evidenced by the ptient being noted to be firly high functioning prior to dmission, mbulting without ssist t home prior to this dmission.  Procedures:  S/p ORIF distl humerus frcture 1/29  Consulttions:  orthopedics  Dischrge Exm: Vitls:   05/06/18 0628 05/06/18 1003  BP: (!) 154/70 121/75  Pulse: 65 92  Resp: 14 15  Temp: (!) 97.4 F (36.3 C) 98.8 F (37.1 C)  SpO2: 100% 99%   Still confused Thinks siblings/friends in room, rther thn her children No pin 2 sons t bedside.  Discuss pln for dischrge.  Generl: No cute distress. Crdiovsculr: Hert sounds show  regulr rte, nd rhythm.  Lungs: Cler to usculttion bilterlly  Abdomen: Soft, nontender, nondistended  Neurologicl: Alert nd disoriented, plesntly confused. Moves ll extremities 4 . Skin: Wrm nd dry. No rshes or lesions. Extremities: R rm in sling Psychitric: Mood nd ffect re norml. Insight nd judgment re impired.   Dischrge Instructions   Dischrge Instructions    Cll MD for:  difficulty brething, hedche or visul disturbnces   Complete by:  As directed    Cll MD for:  extreme ftigue   Complete by:  As directed    Cll MD for:  hives   Complete by:  As directed    Cll MD for:  persistnt dizziness or light-hededness   Complete by:  As directed    Cll MD for:  persistnt nuse nd vomiting   Complete by:  As directed    Cll MD for:  redness, tenderness, or signs of infection (pin, swelling, redness, odor or green/yellow dischrge round incision site)   Complete by:  As directed    Cll MD for:  severe uncontrolled pin   Complete by:  As directed    Cll MD for:  temperture >100.4   Complete by:  As directed    Diet - low sodium hert helthy   Complete by:  As directed    Dischrge instructions   Complete by:  As directed    You were seen for  right  humerus frcture.  This ws corrected surgiclly by orthopedics.  We'll send you home with scheduled tylenol for the next 5 dys for pin, then use this s needed.  You're limited to weight ber 5 lbs to the right upper extremity.    Plese follow up with Dr. Griffin Bsil bout 1 week post op.  Return for new, recurrent, or worsening symptoms.  Plese sk your PCP to request records from this hospitliztion so they know wht ws done nd wht the next steps will be.   Increse ctivity slowly   Complete by:  As directed      Allergies s of 05/06/2018      Rections   Codeine    Lescol [fluvsttin Sodium]       Mediction List    TAKE these medictions   cetminophen 325 MG tblet Commonly known s:  TYLENOL Tke 2 tblets (650 mg totl) by mouth every 6 (six) hours for 5 dys. (then s needed for pin)   CALCIUM 600 PO Tke 1,200 mg by mouth dily.   levothyroxine 88 MCG tblet Commonly known s:  SYNTHROID, LEVOTHROID Tke 88 mcg by mouth dily before brekfst.   polyethylene glycol pcket Commonly known s:  MIRALAX / GLYCOLAX Tke 17 g by  mouth daily.   traZODone 50 MG tablet Commonly known as:  DESYREL Take 1 tablet (50 mg total) by mouth at bedtime as needed for up to 30 days for sleep.      Allergies  Allergen Reactions  . Codeine   . Lescol [Fluvastatin Sodium]    Follow-up Information    Dion Body, MD Follow up.   Specialty:  Family Medicine Why:  Call for follow up  Contact information: [address] [place] [address] [phone]        Hiram Gash, MD Follow up.   Specialty:  Orthopedic Surgery Why:  Call for follow up Contact information: [address] [place] [address] [phone]            The results of significant diagnostics from this hospitalization (including imaging, microbiology, ancillary and laboratory) are listed below for reference.    Significant  Diagnostic Studies: Dg Shoulder Right  Result Date: 05/01/2018 CLINICAL DATA:  85 y/o  F; fall with deformity to the right humerus. EXAM: RIGHT HUMERUS - 2+ VIEW; RIGHT SHOULDER - 2+ VIEW COMPARISON:  None. FINDINGS: Right humerus: Acute oblique fracture of the distal right femoral diaphysis with 1 shaft's with lateral displacement of the proximal fracture component, 3.7 cm overriding, and apex anterolateral angulation. The elbow joint is maintained. Right shoulder: No acute fracture or dislocation the shoulder joint. Acromioclavicular and coracoclavicular distances are normal. Decreased acromial humeral distance may represent underlying rotator cuff injury. IMPRESSION: Acute oblique overriding and displaced fracture of the right distal humeral diaphysis. No dislocation. Electronically Signed   By: Kristine Garbe M.D.   On: 05/01/2018 22:05   Dg Elbow 2 Views Right  Result Date: 05/02/2018 CLINICAL DATA:  Right distal humerus ORIF. EXAM: RIGHT ELBOW - 2 VIEW; DG C-ARM 61-120 MIN COMPARISON:  Right humerus x-rays from yesterday. FLUOROSCOPY TIME:  28 seconds. C-arm fluoroscopic images were obtained intraoperatively and submitted for post operative interpretation. FINDINGS: Frontal lateral intraoperative fluoroscopic images demonstrate interval posterior plate and screw fixation of the distal humeral diaphyseal fracture. Alignment is now anatomic. The elbow joint is unremarkable. IMPRESSION: 1. Intraoperative fluoroscopic guidance for distal humerus ORIF, now in anatomic alignment. Electronically Signed   By: Titus Dubin M.D.   On: 05/02/2018 15:05   Ct Head Wo Contrast  Result Date: 05/01/2018 CLINICAL DATA:  85 year old female with head, face and neck injury following fall today. Initial encounter. EXAM: CT HEAD WITHOUT CONTRAST CT MAXILLOFACIAL WITHOUT CONTRAST CT CERVICAL SPINE WITHOUT CONTRAST TECHNIQUE: Multidetector CT imaging of the head, cervical spine, and maxillofacial structures  were performed using the standard protocol without intravenous contrast. Multiplanar CT image reconstructions of the cervical spine and maxillofacial structures were also generated. COMPARISON:  11/21/2017 CTs FINDINGS: CT HEAD FINDINGS Brain: No evidence of acute infarction, hemorrhage, hydrocephalus, extra-axial collection or mass lesion/mass effect. Atrophy and chronic small-vessel white matter ischemic changes again noted. Vascular: Carotid atherosclerotic calcifications again noted. Skull: Normal. Negative for fracture or focal lesion. Other: None. CT MAXILLOFACIAL FINDINGS Osseous: No acute fracture or dislocation. No suspicious focal bony abnormalities noted. Orbits: Negative. No traumatic or inflammatory finding. Sinuses: Clear. Soft tissues: Negative. CT CERVICAL SPINE FINDINGS Alignment: No acute subluxation. Skull base and vertebrae: No acute fracture. No primary bone lesion or focal pathologic process. Soft tissues and spinal canal: No prevertebral fluid or swelling. No visible canal hematoma. Disc levels: Mild to moderate multilevel degenerative disc disease/spondylosis again noted. Upper chest: No acute abnormality. Other:  None IMPRESSION: 1. No evidence of acute intracranial abnormality. Atrophy and chronic small-vessel white matter ischemic changes. 2. No evidence of acute facial injury. 3. No static evidence of acute injury to the cervical spine. Unchanged mild to moderate multilevel degenerative disc disease/spondylosis. Electronically Signed   By: Margarette Canada M.D.   On: 05/01/2018 21:57   Ct Cervical Spine Wo Contrast  Result Date: 05/01/2018 CLINICAL DATA:  85 year old female with head, face and neck injury following fall today. Initial encounter. EXAM: CT HEAD WITHOUT CONTRAST CT MAXILLOFACIAL WITHOUT CONTRAST CT CERVICAL SPINE WITHOUT CONTRAST TECHNIQUE: Multidetector CT imaging of the head, cervical spine, and maxillofacial structures were performed using the standard protocol without  intravenous contrast. Multiplanar CT image reconstructions of the cervical spine and maxillofacial structures were also generated. COMPARISON:  11/21/2017 CTs FINDINGS: CT HEAD FINDINGS Brain: No evidence of acute infarction, hemorrhage, hydrocephalus, extra-axial collection or mass lesion/mass effect. Atrophy and chronic small-vessel white matter ischemic changes again noted. Vascular: Carotid atherosclerotic calcifications again noted. Skull: Normal. Negative for fracture or focal lesion. Other: None. CT MAXILLOFACIAL FINDINGS Osseous: No acute fracture or dislocation. No suspicious focal bony abnormalities noted. Orbits: Negative. No traumatic or inflammatory finding. Sinuses: Clear. Soft tissues: Negative. CT CERVICAL SPINE FINDINGS Alignment: No acute subluxation. Skull base and vertebrae: No acute fracture. No primary bone lesion or focal pathologic process. Soft tissues and spinal canal: No prevertebral fluid or swelling. No visible canal hematoma. Disc levels: Mild to moderate multilevel degenerative disc disease/spondylosis again noted. Upper chest: No acute abnormality. Other: None IMPRESSION: 1. No evidence of acute intracranial abnormality. Atrophy and chronic small-vessel white matter ischemic changes. 2. No evidence of acute facial injury. 3. No static evidence of acute injury to the cervical spine. Unchanged mild to moderate multilevel degenerative disc disease/spondylosis. Electronically Signed   By: Margarette Canada M.D.   On: 05/01/2018 21:57   Dg Chest Port 1 View  Result Date: 05/01/2018 CLINICAL DATA:  Pain. Found down. EXAM: PORTABLE CHEST 1 VIEW COMPARISON:  None. FINDINGS: Low lung volumes.The cardiomediastinal contours are normal. Atherosclerosis of the thoracic aorta. Pulmonary vasculature is normal. Skin fold projects over the left chest. No consolidation, pleural effusion, or pneumothorax. No acute osseous abnormalities are seen. IMPRESSION: Low lung volumes without acute abnormality.  Electronically Signed   By: Keith Rake M.D.   On: 05/01/2018 22:18   Dg Humerus Right  Result Date: 05/02/2018 CLINICAL DATA:  ORIF right humerus. EXAM: RIGHT HUMERUS - 2+ VIEW COMPARISON:  05/02/2018 intraoperative images. FINDINGS: Screw and plate fixation of the distal right humerus, in anatomic alignment. Expected postoperative soft tissue gas. IMPRESSION: Screw and plate fixation of the distal right humerus, in anatomic alignment. Electronically Signed   By: Ulyses Jarred M.D.   On: 05/02/2018 15:59   Dg Humerus Right  Result Date: 05/01/2018 CLINICAL DATA:  85 y/o  F; fall with deformity to the right humerus. EXAM: RIGHT HUMERUS - 2+ VIEW; RIGHT SHOULDER - 2+ VIEW COMPARISON:  None. FINDINGS: Right humerus: Acute oblique fracture of the distal right femoral diaphysis with 1 shaft's with lateral displacement of the proximal fracture component, 3.7 cm overriding, and apex anterolateral angulation. The elbow joint is maintained. Right shoulder: No acute fracture or dislocation the shoulder joint. Acromioclavicular and coracoclavicular distances are normal. Decreased acromial humeral distance may represent underlying rotator cuff injury. IMPRESSION: Acute oblique overriding and displaced fracture of the right distal humeral diaphysis. No dislocation. Electronically Signed   By: Kristine Garbe M.D.   On: 05/01/2018 **Note De-Identified vi Obfusction** 22:05   Dg C-rm 1-60 Min  Result Dte: 05/02/2018 CLINICAL DATA:  Right distl humerus ORIF. EXAM: RIGHT ELBOW - 2 VIEW; DG C-ARM 61-120 MIN COMPARISON:  Right humerus x-rys from yesterdy. FLUOROSCOPY TIME:  28 seconds. C-rm fluoroscopic imges were obtined intropertively nd submitted for post opertive interprettion. FINDINGS: Frontl lterl intropertive fluoroscopic imges demonstrte intervl posterior plte nd screw fixtion of the distl humerl diphysel frcture. Alignment is now ntomic. The elbow joint is unremrkble. IMPRESSION: 1. Intropertive  fluoroscopic guidnce for distl humerus ORIF, now in ntomic lignment. Electroniclly Signed   By: Titus Dubin M.D.   On: 05/02/2018 15:05   Ct Mxillofcil Wo Cm  Result Dte: 05/01/2018 CLINICAL DATA:  33 yer old femle with hed, fce nd neck injury following fll tody. Initil encounter. EXAM: CT HEAD WITHOUT CONTRAST CT MAXILLOFACIAL WITHOUT CONTRAST CT CERVICAL SPINE WITHOUT CONTRAST TECHNIQUE: Multidetector CT imging of the hed, cervicl spine, nd mxillofcil structures were performed using the stndrd protocol without intrvenous contrst. Multiplnr CT imge reconstructions of the cervicl spine nd mxillofcil structures were lso generted. COMPARISON:  11/21/2017 CTs FINDINGS: CT HEAD FINDINGS Brin: No evidence of cute infrction, hemorrhge, hydrocephlus, extr-xil collection or mss lesion/mss effect. Atrophy nd chronic smll-vessel white mtter ischemic chnges gin noted. Vsculr: Crotid therosclerotic clcifictions gin noted. Skull: Norml. Negtive for frcture or focl lesion. Other: None. CT MAXILLOFACIAL FINDINGS Osseous: No cute frcture or disloction. No suspicious focl bony bnormlities noted. Orbits: Negtive. No trumtic or inflmmtory finding. Sinuses: Cler. Soft tissues: Negtive. CT CERVICAL SPINE FINDINGS Alignment: No cute subluxtion. Skull bse nd vertebre: No cute frcture. No primry bone lesion or focl pthologic process. Soft tissues nd spinl cnl: No prevertebrl fluid or swelling. No visible cnl hemtom. Disc levels: Mild to moderte multilevel degenertive disc disese/spondylosis gin noted. Upper chest: No cute bnormlity. Other: None IMPRESSION: 1. No evidence of cute intrcrnil bnormlity. Atrophy nd chronic smll-vessel white mtter ischemic chnges. 2. No evidence of cute fcil injury. 3. No sttic evidence of cute injury to the cervicl spine. Unchnged mild to moderte multilevel degenertive disc  disese/spondylosis. Electroniclly Signed   By: Mrgrette Cnd M.D.   On: 05/01/2018 21:57    Microbiology: Recent Results (from the pst 240 hour(s))  Surgicl pcr screen     Sttus: None   Collection Time: 05/02/18  1:50 AM  Result Vlue Ref Rnge Sttus   MRSA, PCR NEGATIVE NEGATIVE Finl   Stphylococcus ureus NEGATIVE NEGATIVE Finl    Comment: (NOTE) The Xpert SA Assy (FDA pproved for NASAL specimens in ptients 56 yers of ge nd older), is one component of  comprehensive surveillnce progrm. It is not intended to dignose infection nor to guide or monitor tretment. Performed t [facility], [place] [address], [place], [place] [address]      Lbs: Bsic Metbolic Pnel: Recent Lbs  Lb 05/02/18 0330 05/03/18 0656 05/04/18 0316 05/05/18 0513 05/06/18 0610  NA 140 141 142 142 142  K 3.9 4.3 4.0 3.5 4.0  CL 107 109 108 108 107  CO2 25 26 27 25 26   GLUCOSE 126* 94 99 103* 89  BUN 30* 19 18 15  24*  CREATININE 0.91 1.03* 0.98 0.89 1.07*  CALCIUM 8.6* 8.4* 8.6* 9.1 8.8*  MG  --  2.0 1.9 2.0 2.1   Liver Function Tests: Recent Lbs  Lb 05/04/18 0316  AST 27  ALT 11  ALKPHOS 30*  BILITOT 0.6  PROT 5.5*  ALBUMIN 3.0*   No results for input(s): LIPASE, AMYLASE  in the last 168 hours. No results for input(s): AMMONIA in the last 168 hours. CBC: Recent Labs  Lab 05/01/18 2002 05/02/18 0330 05/03/18 0656 05/04/18 0316 05/05/18 0513 05/06/18 0610  WBC 17.6* 11.4* 12.7* 11.4* 12.0* 9.5  NEUTROABS 15.6*  --   --   --   --   --   HGB 12.4 10.8* 11.0* 9.7* 10.8* 11.1*  HCT 38.3 33.9* 34.4* 30.8* 33.9* 34.1*  MCV 91.0 90.2 90.1 90.3 89.7 91.4  PLT 198 129* 172 PLATELET CLUMPS NOTED ON SMEAR, UNABLE TO ESTIMATE PLATELET CLUMPS NOTED ON SMEAR, UNABLE TO ESTIMATE PLATELET CLUMPS NOTED ON SMEAR, UNABLE TO ESTIMATE   Cardiac Enzymes: Recent Labs  Lab 05/01/18 2002  CKTOTAL 279*   BNP: BNP (last 3 results) No results for input(s): BNP in the last 8760  hours.  ProBNP (last 3 results) No results for input(s): PROBNP in the last 8760 hours.  CBG: No results for input(s): GLUCAP in the last 168 hours.     Signed:  Fayrene Helper MD.  [facility] 05/06/2018, 11:46 AM

## 2018-05-06 NOTE — Progress Notes (Signed)
Occupational Therapy Treatment Patient Details Name: Michele Lambert MRN: [id number] DOB: 09-Apr-1926 Today's Date: 05/06/2018    History of present illness Michele Lambert is a 85 y.o. female with medical history significant for Hypothyroidism and Dementia who presents to the ED after an unwitnessed fall at home. Radiographs revealed a comminuted right supracondylar elbow fracture. Pt is now s/p ORIF on 1/29.    OT comments  Pt. Seen for ROM of RUE and toileting task with focus on remaining NWB RUE during adls and functional mobility.  Able to return demo of ROM with intermittent tactile guidance and demonstrational  Cues.  Max cues not to use RUE during adls and transfers.  Family reports d/c snf today for continued therapy.    Follow Up Recommendations  SNF;Supervision/Assistance - 24 hour    Equipment Recommendations  None recommended by OT    Recommendations for Other Services      Precautions / Restrictions Precautions Precautions: Fall Required Braces or Orthoses: Sling Restrictions Weight Bearing Restrictions: Yes RUE Weight Bearing: Non weight bearing       Mobility Bed Mobility Overal bed mobility: Needs Assistance Bed Mobility: Supine to Sit     Supine to sit: Min assist        Transfers Overall transfer level: Needs assistance Equipment used: 1 person hand held assist Transfers: Sit to/from [facility] Sit to Stand: Min assist Stand pivot transfers: Min assist            Balance                                           ADL either performed or assessed with clinical judgement   ADL Overall ADL's : Needs assistance/impaired                 Upper Body Dressing : Moderate assistance       Toilet Transfer: Minimal assistance;Moderate assistance;Regular Toilet;Ambulation Toilet Transfer Details (indicate cue type and reason): 1 person HHA Toileting- Clothing Manipulation and Hygiene: Minimal  assistance;Sitting/lateral lean Toileting - Clothing Manipulation Details (indicate cue type and reason): better compliance with use of L hand today for pericare     Functional mobility during ADLs: Minimal assistance;Moderate assistance General ADL Comments: both sons present. reviewed handout for RUE rom.  and nwb protocol. reviewed rec. to wear sling during functional mobility to aide in pt. not attempting use of r ue.       Vision       Perception     Praxis      Cognition Arousal/Alertness: Awake/alert Behavior During Therapy: Impulsive Overall Cognitive Status: History of cognitive impairments - at baseline                                          Exercises     Shoulder Instructions       General Comments      Pertinent Vitals/ Pain       Pain Assessment: No/denies pain  Home Living                                          Prior Functioning/Environment  Frequency  Min 3X/week        Progress Toward Goals  OT Goals(current goals can now be found in the care plan section)  Progress towards OT goals: Progressing toward goals     Plan      Co-evaluation                 AM-PAC OT "6 Clicks" Daily Activity     Outcome Measure   Help from another person eating meals?: A Lot Help from another person taking care of personal grooming?: A Lot Help from another person toileting, which includes using toliet, bedpan, or urinal?: A Lot Help from another person bathing (including washing, rinsing, drying)?: A Lot Help from another person to put on and taking off regular upper body clothing?: A Lot Help from another person to put on and taking off regular lower body clothing?: A Lot 6 Click Score: 12    End of Session    OT Visit Diagnosis: History of falling (Z91.81);Pain Pain - Right/Left: Right Pain - part of body: Arm   Activity Tolerance Patient tolerated treatment well   Patient Left in  chair;with call bell/phone within reach;with chair alarm set;with family/visitor present   Nurse Communication          Time: 1950-9326 OT Time Calculation (min): 26 min  Charges: OT General Charges $OT Visit: 1 Visit OT Treatments $Self Care/Home Management : 8-22 mins $Therapeutic Exercise: 8-22 mins  Janice Coffin, COTA/L 05/06/2018, 11:11 AM

## 2018-12-17 ENCOUNTER — Other Ambulatory Visit: Payer: Self-pay | Admitting: *Deleted

## 2018-12-17 DIAGNOSIS — Z20822 Contact with and (suspected) exposure to covid-19: Secondary | ICD-10-CM

## 2018-12-18 LAB — NOVEL CORONAVIRUS, NAA: SARS-CoV-2, NAA: NOT DETECTED

## 2019-04-24 ENCOUNTER — Ambulatory Visit: Payer: Medicare Other | Attending: Internal Medicine

## 2019-04-24 NOTE — Progress Notes (Unsigned)
   Covid-19 Vaccination Clinic  Name:  Michele Lambert    MRN: [id number] DOB: 1926-05-20  04/24/2019  Ms. Michele Lambert was observed post Covid-19 immunization for 15 minutes without incidence. She was provided with Vaccine Information Sheet and instruction to access the V-Safe system.   Ms. Michele Lambert was instructed to call 911 with any severe reactions post vaccine: [name] Kitchen Difficulty breathing  . Swelling of your face and throat  . A fast heartbeat  . A bad rash all over your body  . Dizziness and weakness

## 2019-05-15 ENCOUNTER — Ambulatory Visit: Payer: Medicare Other | Attending: Internal Medicine

## 2019-05-15 ENCOUNTER — Ambulatory Visit: Payer: Medicare Other

## 2019-05-15 DIAGNOSIS — Z23 Encounter for immunization: Secondary | ICD-10-CM | POA: Insufficient documentation

## 2019-05-15 NOTE — Progress Notes (Signed)
   Covid-19 Vaccination Clinic  Name:  Michele Lambert    MRN: [id number] DOB: [birth year]  05/15/2019  Michele Lambert was observed post Covid-19 immunization for 15 minutes without incidence. She was provided with Vaccine Information Sheet and instruction to access the V-Safe system.   Michele Lambert was instructed to call 911 with any severe reactions post vaccine: [name] Kitchen Difficulty breathing  . Swelling of your face and throat  . A fast heartbeat  . A bad rash all over your body  . Dizziness and weakness    Immunizations Administered    Name Date Dose VIS Date Route   Pfizer COVID-19 Vaccine 05/15/2019 11:25 AM 0.3 mL 03/15/2019 Intramuscular   Manufacturer: [place]   Lot: [id number]   [place]: [id number]

## 2019-10-01 DIAGNOSIS — N183 Chronic kidney disease, stage 3 unspecified: Secondary | ICD-10-CM | POA: Diagnosis present

## 2020-08-07 ENCOUNTER — Emergency Department: Payer: Medicare Other

## 2020-08-07 ENCOUNTER — Other Ambulatory Visit: Payer: Self-pay

## 2020-08-07 ENCOUNTER — Emergency Department
Admission: EM | Admit: 2020-08-07 | Discharge: 2020-08-07 | Disposition: A | Discharge status: Home / Self Care | Payer: Medicare Other | Attending: Emergency Medicine | Admitting: Emergency Medicine

## 2020-08-07 DIAGNOSIS — Z79899 Other long term (current) drug therapy: Secondary | ICD-10-CM | POA: Diagnosis not present

## 2020-08-07 DIAGNOSIS — W1830XA Fall on same level, unspecified, initial encounter: Secondary | ICD-10-CM | POA: Insufficient documentation

## 2020-08-07 DIAGNOSIS — F0390 Unspecified dementia without behavioral disturbance: Secondary | ICD-10-CM | POA: Insufficient documentation

## 2020-08-07 DIAGNOSIS — S80811A Abrasion, right lower leg, initial encounter: Secondary | ICD-10-CM | POA: Diagnosis not present

## 2020-08-07 DIAGNOSIS — W19XXXA Unspecified fall, initial encounter: Secondary | ICD-10-CM

## 2020-08-07 DIAGNOSIS — Y92091 Bathroom in other non-institutional residence as the place of occurrence of the external cause: Secondary | ICD-10-CM | POA: Insufficient documentation

## 2020-08-07 DIAGNOSIS — E039 Hypothyroidism, unspecified: Secondary | ICD-10-CM | POA: Insufficient documentation

## 2020-08-07 DIAGNOSIS — S20312A Abrasion of left front wall of thorax, initial encounter: Secondary | ICD-10-CM | POA: Diagnosis not present

## 2020-08-07 DIAGNOSIS — S8991XA Unspecified injury of right lower leg, initial encounter: Secondary | ICD-10-CM | POA: Diagnosis present

## 2020-08-07 DIAGNOSIS — E871 Hypo-osmolality and hyponatremia: Secondary | ICD-10-CM | POA: Diagnosis not present

## 2020-08-07 LAB — CBC WITH DIFFERENTIAL/PLATELET
Abs Immature Granulocytes: 0.07 10*3/uL (ref 0.00–0.07)
Basophils Absolute: 0 10*3/uL (ref 0.0–0.1)
Basophils Relative: 0 %
Eosinophils Absolute: 0.1 10*3/uL (ref 0.0–0.5)
Eosinophils Relative: 1 %
HCT: 38.9 % (ref 36.0–46.0)
Hemoglobin: 13.1 g/dL (ref 12.0–15.0)
Immature Granulocytes: 1 %
Lymphocytes Relative: 10 %
Lymphs Abs: 1.2 10*3/uL (ref 0.7–4.0)
MCH: 28.9 pg (ref 26.0–34.0)
MCHC: 33.7 g/dL (ref 30.0–36.0)
MCV: 85.7 fL (ref 80.0–100.0)
Monocytes Absolute: 0.8 10*3/uL (ref 0.1–1.0)
Monocytes Relative: 7 %
Neutro Abs: 9.4 10*3/uL — ABNORMAL HIGH (ref 1.7–7.7)
Neutrophils Relative %: 81 %
Platelets: 226 10*3/uL (ref 150–400)
RBC: 4.54 MIL/uL (ref 3.87–5.11)
RDW: 13.4 % (ref 11.5–15.5)
WBC: 11.6 10*3/uL — ABNORMAL HIGH (ref 4.0–10.5)
nRBC: 0 % (ref 0.0–0.2)

## 2020-08-07 LAB — COMPREHENSIVE METABOLIC PANEL
ALT: 12 U/L (ref 0–44)
AST: 22 U/L (ref 15–41)
Albumin: 4 g/dL (ref 3.5–5.0)
Alkaline Phosphatase: 56 U/L (ref 38–126)
Anion gap: 11 (ref 5–15)
BUN: 19 mg/dL (ref 8–23)
CO2: 27 mmol/L (ref 22–32)
Calcium: 9.2 mg/dL (ref 8.9–10.3)
Chloride: 91 mmol/L — ABNORMAL LOW (ref 98–111)
Creatinine, Ser: 1.02 mg/dL — ABNORMAL HIGH (ref 0.44–1.00)
GFR, Estimated: 52 mL/min — ABNORMAL LOW (ref >60)
Glucose, Bld: 134 mg/dL — ABNORMAL HIGH (ref 70–99)
Potassium: 4.1 mmol/L (ref 3.5–5.1)
Sodium: 129 mmol/L — ABNORMAL LOW (ref 135–145)
Total Bilirubin: 0.8 mg/dL (ref 0.3–1.2)
Total Protein: 7 g/dL (ref 6.5–8.1)

## 2020-08-07 LAB — URINALYSIS, COMPLETE (UACMP) WITH MICROSCOPIC
Bacteria, UA: NONE SEEN
Bilirubin Urine: NEGATIVE
Glucose, UA: NEGATIVE mg/dL
Hgb urine dipstick: NEGATIVE
Ketones, ur: NEGATIVE mg/dL
Leukocytes,Ua: NEGATIVE
Nitrite: NEGATIVE
Protein, ur: NEGATIVE mg/dL
Specific Gravity, Urine: 1.005 (ref 1.005–1.030)
Squamous Epithelial / HPF: NONE SEEN (ref 0–5)
pH: 8 (ref 5.0–8.0)

## 2020-08-07 LAB — BRAIN NATRIURETIC PEPTIDE: B Natriuretic Peptide: 194.4 pg/mL — ABNORMAL HIGH (ref 0.0–100.0)

## 2020-08-07 LAB — TROPONIN I (HIGH SENSITIVITY): Troponin I (High Sensitivity): 8 ng/L (ref <18)

## 2020-08-07 MED ORDER — LIDOCAINE 5 % EX PTCH
1.0000 | MEDICATED_PATCH | CUTANEOUS | Status: DC
  Administered 2020-08-07: 1 via TRANSDERMAL
  Filled 2020-08-07: qty 1

## 2020-08-07 MED ORDER — ACETAMINOPHEN 325 MG PO TABS
650.0000 mg | ORAL_TABLET | Freq: Once | ORAL | Status: AC
  Administered 2020-08-07: 650 mg via ORAL
  Filled 2020-08-07: qty 2

## 2020-08-07 MED ORDER — LIDOCAINE 5 % EX PTCH: 1.0000 | MEDICATED_PATCH | Freq: Two times a day (BID) | CUTANEOUS | 0 refills | Status: AC

## 2020-08-07 NOTE — ED Notes (Signed)
Pt back from CT/XR.

## 2020-08-07 NOTE — ED Triage Notes (Signed)
Pt to ED for fall last night at 1230, fell when trying to sit on toilet. Denies hitting head, no blood thinner use.  C/o pain to left lower leg, swelling noted.  Also c/o pain to left rib area.  Hx dementia   Daughter at bedside reports pt has been leaning to the left for the past 36 hours and ambulating differently. Pt leaning toward left however c/o pain to left side

## 2020-08-07 NOTE — ED Notes (Addendum)
Patient transported to X-ray & CT °

## 2020-08-07 NOTE — ED Provider Notes (Signed)
[facility] Emergency Department Provider Note  ____________________________________________   Event Date/Time   First MD Initiated Contact with Patient 08/07/20 1142     (approximate)  I have reviewed the triage vital signs and the nursing notes.   HISTORY  Chief Complaint Fall    HPI Michele Lambert is a 87 y.o. female with dementia who comes in for a fall.  Patient had a fall last night.  According to the daughter who is at bedside patient has a history of dementia and they moved where the portable toilet was and they think that she got confused and tried to go to the chair and urinated on herself.  They heard a thud and when they came back over she was sitting down on the ground.  They were able to ambulate her to the bed.  Patient has an abrasion on her right lower leg, left chest wall.  They stated that about 1 week ago he she had complained of some chest pain and had an EKG done at primary care office that was reassuring and has an echocardiogram scheduled.  He also reported little bit of swelling in her ankles.  They also reported that over the past few days her walking seems to be more off where she will lean to the left.  Unable to get full HPI from patient due to baseline dementia           Past Medical History:  Diagnosis Date  . Dementia ([place])   . Hypothyroidism     Patient Active Problem List   Diagnosis Date Noted  . Fracture of distal humerus 05/03/2018  . Displaced fracture of right humerus 05/01/2018  . Hypothyroidism 05/01/2018  . Dementia ([place]) 05/01/2018  . Unwitnessed fall 05/01/2018    Past Surgical History:  Procedure Laterality Date  . ORIF HUMERUS FRACTURE Right 05/02/2018   Procedure: OPEN REDUCTION INTERNAL FIXATION (ORIF) DISTAL HUMERUS FRACTURE;  Surgeon: Hiram Gash, MD;  Location: [place];  Service: Orthopedics;  Laterality: Right;    Prior to Admission medications   Medication Sig Start Date End Date Taking?  Authorizing Provider  Calcium Carbonate (CALCIUM 600 PO) Take 1,200 mg by mouth daily.    [provider]  levothyroxine (SYNTHROID, LEVOTHROID) 88 MCG tablet Take 88 mcg by mouth daily before breakfast.    [provider]  polyethylene glycol (MIRALAX / GLYCOLAX) packet Take 17 g by mouth daily. 05/06/18   Elodia Florence., MD  traZODone (DESYREL) 50 MG tablet Take 1 tablet (50 mg total) by mouth at bedtime as needed for up to 30 days for sleep. 05/06/18 06/05/18  Elodia Florence., MD    Allergies Codeine and Lescol [fluvastatin sodium]  Family History  Problem Relation Age of Onset  . Stroke Mother     Social History Social History   Tobacco Use  . Smoking status: Never Smoker  . Smokeless tobacco: Never Used      Review of Systems Unable to get for full review of system from patient due to baseline dementia ____________________________________________   PHYSICAL EXAM:  VITAL SIGNS: Blood pressure (!) 146/80, pulse 85, temperature 98.7 F (37.1 C), temperature source Oral, resp. rate 16, height 5\' 5"  (1.651 m), weight 61.6 kg, SpO2 96 %.   Constitutional: Alert and oriented x1. Well appearing and in no acute distress. Eyes: Conjunctivae are normal. EOMI. Head: Atraumatic. Nose: No congestion/rhinnorhea. Mouth/Throat: Mucous membranes are moist.   Neck: No stridor. Trachea Midline.  FROM Cardiovascular: Normal rate, regular rhythm. Grossly normal heart sounds.  Good peripheral circulation.  Left chest wall tenderness with small abrasion noted Respiratory: Normal respiratory effort.  No retractions. Lungs CTAB. Gastrointestinal: Soft and nontender. No distention. No abdominal bruits.  Musculoskeletal: Small abrasion and swelling noted to the right tib-fib.  Able to lift both legs up off the bed..  No joint effusions. Neurologic:  Normal speech and language. No gross focal neurologic deficits are appreciated.  Equal grip strength, equal leg  strength.  No facial droop Skin:  Skin is warm, dry and intact. No rash noted. Psychiatric: Mood and affect are normal. Speech and behavior are normal. GU: Deferred   ____________________________________________   LABS (all labs ordered are listed, but only abnormal results are displayed)  Labs Reviewed  CBC WITH DIFFERENTIAL/PLATELET - Abnormal; Notable for the following components:      Result Value   WBC 11.6 (*)    Neutro Abs 9.4 (*)    All other components within normal limits  COMPREHENSIVE METABOLIC PANEL - Abnormal; Notable for the following components:   Sodium 129 (*)    Chloride 91 (*)    Glucose, Bld 134 (*)    Creatinine, Ser 1.02 (*)    GFR, Estimated 52 (*)    All other components within normal limits  URINALYSIS, COMPLETE (UACMP) WITH MICROSCOPIC - Abnormal; Notable for the following components:   Color, Urine STRAW (*)    APPearance CLEAR (*)    All other components within normal limits  BRAIN NATRIURETIC PEPTIDE - Abnormal; Notable for the following components:   B Natriuretic Peptide 194.4 (*)    All other components within normal limits  TROPONIN I (HIGH SENSITIVITY)   ____________________________________________   ED ECG REPORT I, Concha SeMary E Caio Devera, the attending physician, personally viewed and interpreted this ECG.  Normal sinus rate of 75, no ST elevation, no T wave versions, normal intervals ____________________________________________  RADIOLOGY   Official radiology report(s): DG Ribs Unilateral W/Chest Left  Result Date: 08/07/2020 CLINICAL DATA:  Fall EXAM: LEFT RIBS AND CHEST - 3+ VIEW COMPARISON:  05/01/2018 FINDINGS: Cardiac enlargement without heart failure. Atherosclerotic aortic arch. Minimal left lower lobe atelectasis. Lungs otherwise clear. No effusion or pneumothorax. Possible fracture left anterior seventh rib without significant displacement. IMPRESSION: Possible nondisplaced fracture left seventh rib anteriorly. Mild left lower lobe  atelectasis.  Lungs otherwise clear. Electronically Signed   By: Marlan Palauharles  Clark M.D.   On: 08/07/2020 13:14   DG Tibia/Fibula Right  Result Date: 08/07/2020 CLINICAL DATA:  Larey SeatFell last night.  Right lower leg pain. EXAM: RIGHT TIBIA AND FIBULA - 2 VIEW COMPARISON:  None. FINDINGS: No fracture or bone lesion. Knee and ankle joints are normally aligned. Soft tissues are unremarkable. IMPRESSION: Negative. Electronically Signed   By: Amie Portlandavid  Ormond M.D.   On: 08/07/2020 13:12   CT Head Wo Contrast  Result Date: 08/07/2020 CLINICAL DATA:  Fall.  Head injury EXAM: CT HEAD WITHOUT CONTRAST CT CERVICAL SPINE WITHOUT CONTRAST TECHNIQUE: Multidetector CT imaging of the head and cervical spine was performed following the standard protocol without intravenous contrast. Multiplanar CT image reconstructions of the cervical spine were also generated. COMPARISON:  CT head 07/05/2007 FINDINGS: CT HEAD FINDINGS Brain: Moderate atrophy with significant progression. Moderate white matter hypodensity diffusely with progression. Negative for acute infarct, hemorrhage, mass. Vascular: Negative for hyperdense vessel Skull: Negative Sinuses/Orbits: Mild mucosal edema right sphenoid sinus otherwise clear. Bilateral cataract extraction Other: None CT CERVICAL SPINE FINDINGS Alignment: Mild retrolisthesis C3-4,  C4-5, C5-6. 3 mm anterolisthesis C7-T1 Skull base and vertebrae: Negative for fracture Soft tissues and spinal canal: Negative Disc levels: Multilevel disc degeneration and spurring. Asymmetric facet degeneration on the right C7-T1. Foraminal narrowing bilaterally at multiple levels due to spurring. Upper chest: Lung apices clear bilaterally. Other: None IMPRESSION: 1. No acute intracranial abnormality. Significant progression of atrophy and small vessel ischemia the white matter since 2009 2. Cervical spondylosis.  Negative for fracture. Electronically Signed   By: Franchot Gallo M.D.   On: 08/07/2020 13:20   CT Cervical Spine Wo  Contrast  Result Date: 08/07/2020 CLINICAL DATA:  Fall.  Head injury EXAM: CT HEAD WITHOUT CONTRAST CT CERVICAL SPINE WITHOUT CONTRAST TECHNIQUE: Multidetector CT imaging of the head and cervical spine was performed following the standard protocol without intravenous contrast. Multiplanar CT image reconstructions of the cervical spine were also generated. COMPARISON:  CT head 07/05/2007 FINDINGS: CT HEAD FINDINGS Brain: Moderate atrophy with significant progression. Moderate white matter hypodensity diffusely with progression. Negative for acute infarct, hemorrhage, mass. Vascular: Negative for hyperdense vessel Skull: Negative Sinuses/Orbits: Mild mucosal edema right sphenoid sinus otherwise clear. Bilateral cataract extraction Other: None CT CERVICAL SPINE FINDINGS Alignment: Mild retrolisthesis C3-4, C4-5, C5-6. 3 mm anterolisthesis C7-T1 Skull base and vertebrae: Negative for fracture Soft tissues and spinal canal: Negative Disc levels: Multilevel disc degeneration and spurring. Asymmetric facet degeneration on the right C7-T1. Foraminal narrowing bilaterally at multiple levels due to spurring. Upper chest: Lung apices clear bilaterally. Other: None IMPRESSION: 1. No acute intracranial abnormality. Significant progression of atrophy and small vessel ischemia the white matter since 2009 2. Cervical spondylosis.  Negative for fracture. Electronically Signed   By: Franchot Gallo M.D.   On: 08/07/2020 13:20    ____________________________________________   PROCEDURES  Procedure(s) performed (including Critical Care):  Procedures   ____________________________________________   INITIAL IMPRESSION / ASSESSMENT AND PLAN / ED COURSE  Michele Lambert was evaluated in Emergency Department on 08/07/2020 for the symptoms described in the history of present illness. She was evaluated in the context of the global COVID-19 pandemic, which necessitated consideration that the patient might be at risk for infection  with the SARS-CoV-2 virus that causes COVID-19. Institutional protocols and algorithms that pertain to the evaluation of patients at risk for COVID-19 are in a state of rapid change based on information released by regulatory bodies including the CDC and federal and state organizations. These policies and algorithms were followed during the patient's care in the ED.    Patient comes in with a most likely mechanical fall but there was concern about her having some chest pain, ankle swelling and increasing difficulty walking unchanged for the past few days.  Will get EKG and cardiac markers to evaluate for ACS, electrolyte abnormalities, AKI.  Imaging evaluate for intracranial hemorrhage, cervical fracture, rib fracture.  Patient given some Tylenol and lidocaine patch to help with discomfort  Kidney function is around baseline at 1.02 although sodium is slightly low at 129 and chloride is slightly low at 91.  Her BNP is slightly elevated 194 and patient is getting an outpatient echo.  CT head and neck are negative for acute pathology although there is significant atrophy noted.  X-rays are negative except for a possibly nondisplaced fracture left seventh rib.  Patient oxygen levels 100%.  Discussed the results with the daughter as well as the son who is a doctor.  Patient was recently started on Lasix 2 days ago so I suspect that the low sodium  and low chloride is from that.  Patient does not have any fluid on her legs anymore and that got better with the Lasix.  I explained that we should probably just hold the Lasix and follow-up for recheck on Monday.  I do not want to give her a ton of fluid back and cause the swelling to get worse.  We also had extensive discussion about whether or not this could be a stroke or not.  At this time patient has a nonfocal neuro exam and they would like to hold off on MRI.  They understand that if symptoms are worsening they can return the ER for repeat evaluation.  Will get  UA  UA without evidence of infection.  At this time family feels comfortable with being discharged home and will return to the ER if symptoms are worsening.  They will hold her Lasix for 2 days.       ____________________________________________   FINAL CLINICAL IMPRESSION(S) / ED DIAGNOSES   Final diagnoses:  Fall, initial encounter  Hyponatremia      MEDICATIONS GIVEN DURING THIS VISIT:  Medications  lidocaine (LIDODERM) 5 % 1 patch (1 patch Transdermal Patch Applied 08/07/20 1358)  acetaminophen (TYLENOL) tablet 650 mg (650 mg Oral Given 08/07/20 1315)     ED Discharge Orders    None       Note:  This document was prepared using Dragon voice recognition software and may include unintentional dictation errors.   Vanessa Alton, MD 08/07/20 [phone]

## 2020-08-07 NOTE — ED Notes (Signed)
Pt in Xray unable to perform EKG at this moment.

## 2020-08-07 NOTE — Discharge Instructions (Addendum)
Take Tylenol 1 g every 8 hours and the lidocaine patches to help with pain.  Use the incentive spirometry every 1-2 hours while awake as she can.  This should help prevent pneumonia.  Her sodium and chloride were low most likely from being on the Lasix.  Please hold this and follow-up on Monday for recheck with the primary doctor  Possible nondisplaced fracture left seventh rib anteriorly.   Mild left lower lobe atelectasis.  Lungs otherwise clear.  Return to the ER for worsening pain, confusion, weakness on one side of body, slurred speech or any other concern

## 2021-03-08 ENCOUNTER — Ambulatory Visit (LOCAL_COMMUNITY_HEALTH_CENTER): Payer: Medicare Other

## 2021-03-08 ENCOUNTER — Other Ambulatory Visit: Payer: Self-pay

## 2021-03-08 DIAGNOSIS — Z23 Encounter for immunization: Secondary | ICD-10-CM

## 2021-03-08 NOTE — Progress Notes (Signed)
In Nurse Clinic with son. RN administered high dose flu vaccine. Tolerated well. Updated [facility] copy given. Josie Saunders, RN

## 2021-04-09 ENCOUNTER — Other Ambulatory Visit
Admission: RE | Admit: 2021-04-09 | Discharge: 2021-04-09 | Disposition: A | Discharge status: Home / Self Care | Payer: Medicare Other | Source: Ambulatory Visit | Attending: Family Medicine | Admitting: Family Medicine

## 2021-04-09 DIAGNOSIS — R609 Edema, unspecified: Secondary | ICD-10-CM | POA: Insufficient documentation

## 2021-04-09 DIAGNOSIS — R600 Localized edema: Secondary | ICD-10-CM | POA: Diagnosis present

## 2021-04-09 DIAGNOSIS — R635 Abnormal weight gain: Secondary | ICD-10-CM | POA: Insufficient documentation

## 2021-04-09 LAB — BRAIN NATRIURETIC PEPTIDE: B Natriuretic Peptide: 274.4 pg/mL — ABNORMAL HIGH (ref 0.0–100.0)

## 2021-05-06 DIAGNOSIS — I639 Cerebral infarction, unspecified: Secondary | ICD-10-CM

## 2021-05-06 HISTORY — DX: Cerebral infarction, unspecified: I63.9

## 2021-05-07 ENCOUNTER — Emergency Department: Payer: Medicare Other

## 2021-05-07 ENCOUNTER — Inpatient Hospital Stay: Payer: Medicare Other

## 2021-05-07 ENCOUNTER — Other Ambulatory Visit: Payer: Self-pay

## 2021-05-07 ENCOUNTER — Inpatient Hospital Stay
Admission: EM | Admit: 2021-05-07 | Discharge: 2021-05-11 | DRG: 480 | Disposition: A | Discharge status: Skilled Nursing Facility | Payer: Medicare Other | Source: Skilled Nursing Facility | Attending: Obstetrics and Gynecology | Admitting: Obstetrics and Gynecology

## 2021-05-07 ENCOUNTER — Encounter
Admission: EM | Disposition: A | Discharge status: Skilled Nursing Facility | Payer: Self-pay | Source: Skilled Nursing Facility | Attending: Obstetrics and Gynecology

## 2021-05-07 ENCOUNTER — Inpatient Hospital Stay: Payer: Medicare Other | Admitting: Anesthesiology

## 2021-05-07 DIAGNOSIS — I611 Nontraumatic intracerebral hemorrhage in hemisphere, cortical: Secondary | ICD-10-CM | POA: Diagnosis present

## 2021-05-07 DIAGNOSIS — Z419 Encounter for procedure for purposes other than remedying health state, unspecified: Secondary | ICD-10-CM

## 2021-05-07 DIAGNOSIS — Z20822 Contact with and (suspected) exposure to covid-19: Secondary | ICD-10-CM | POA: Diagnosis present

## 2021-05-07 DIAGNOSIS — Z888 Allergy status to other drugs, medicaments and biological substances status: Secondary | ICD-10-CM | POA: Diagnosis not present

## 2021-05-07 DIAGNOSIS — R29810 Facial weakness: Secondary | ICD-10-CM | POA: Diagnosis not present

## 2021-05-07 DIAGNOSIS — S0636AA Traumatic hemorrhage of cerebrum, unspecified, with loss of consciousness status unknown, initial encounter: Secondary | ICD-10-CM | POA: Diagnosis present

## 2021-05-07 DIAGNOSIS — I129 Hypertensive chronic kidney disease with stage 1 through stage 4 chronic kidney disease, or unspecified chronic kidney disease: Secondary | ICD-10-CM | POA: Diagnosis present

## 2021-05-07 DIAGNOSIS — S72142A Displaced intertrochanteric fracture of left femur, initial encounter for closed fracture: Principal | ICD-10-CM

## 2021-05-07 DIAGNOSIS — R339 Retention of urine, unspecified: Secondary | ICD-10-CM

## 2021-05-07 DIAGNOSIS — S06300A Unspecified focal traumatic brain injury without loss of consciousness, initial encounter: Secondary | ICD-10-CM | POA: Diagnosis not present

## 2021-05-07 DIAGNOSIS — S0634AA Traumatic hemorrhage of right cerebrum with loss of consciousness status unknown, initial encounter: Secondary | ICD-10-CM | POA: Diagnosis present

## 2021-05-07 DIAGNOSIS — S0630AA Unspecified focal traumatic brain injury with loss of consciousness status unknown, initial encounter: Secondary | ICD-10-CM

## 2021-05-07 DIAGNOSIS — Z885 Allergy status to narcotic agent status: Secondary | ICD-10-CM | POA: Diagnosis not present

## 2021-05-07 DIAGNOSIS — Z79899 Other long term (current) drug therapy: Secondary | ICD-10-CM | POA: Diagnosis not present

## 2021-05-07 DIAGNOSIS — N1832 Chronic kidney disease, stage 3b: Secondary | ICD-10-CM | POA: Diagnosis present

## 2021-05-07 DIAGNOSIS — Z823 Family history of stroke: Secondary | ICD-10-CM

## 2021-05-07 DIAGNOSIS — N1831 Chronic kidney disease, stage 3a: Secondary | ICD-10-CM | POA: Diagnosis present

## 2021-05-07 DIAGNOSIS — R1310 Dysphagia, unspecified: Secondary | ICD-10-CM | POA: Diagnosis present

## 2021-05-07 DIAGNOSIS — I10 Essential (primary) hypertension: Secondary | ICD-10-CM | POA: Diagnosis present

## 2021-05-07 DIAGNOSIS — I615 Nontraumatic intracerebral hemorrhage, intraventricular: Secondary | ICD-10-CM | POA: Diagnosis present

## 2021-05-07 DIAGNOSIS — I610 Nontraumatic intracerebral hemorrhage in hemisphere, subcortical: Secondary | ICD-10-CM | POA: Diagnosis present

## 2021-05-07 DIAGNOSIS — S72002A Fracture of unspecified part of neck of left femur, initial encounter for closed fracture: Secondary | ICD-10-CM

## 2021-05-07 DIAGNOSIS — F02C0 Dementia in other diseases classified elsewhere, severe, without behavioral disturbance, psychotic disturbance, mood disturbance, and anxiety: Secondary | ICD-10-CM | POA: Diagnosis present

## 2021-05-07 DIAGNOSIS — S42002A Fracture of unspecified part of left clavicle, initial encounter for closed fracture: Secondary | ICD-10-CM

## 2021-05-07 DIAGNOSIS — Z66 Do not resuscitate: Secondary | ICD-10-CM | POA: Diagnosis present

## 2021-05-07 DIAGNOSIS — D62 Acute posthemorrhagic anemia: Secondary | ICD-10-CM | POA: Diagnosis not present

## 2021-05-07 DIAGNOSIS — M25552 Pain in left hip: Secondary | ICD-10-CM | POA: Diagnosis present

## 2021-05-07 DIAGNOSIS — M810 Age-related osteoporosis without current pathological fracture: Secondary | ICD-10-CM | POA: Diagnosis present

## 2021-05-07 DIAGNOSIS — Y92129 Unspecified place in nursing home as the place of occurrence of the external cause: Secondary | ICD-10-CM | POA: Diagnosis not present

## 2021-05-07 DIAGNOSIS — S72009A Fracture of unspecified part of neck of unspecified femur, initial encounter for closed fracture: Secondary | ICD-10-CM

## 2021-05-07 DIAGNOSIS — Z79890 Hormone replacement therapy (postmenopausal): Secondary | ICD-10-CM | POA: Diagnosis not present

## 2021-05-07 DIAGNOSIS — N183 Chronic kidney disease, stage 3 unspecified: Secondary | ICD-10-CM | POA: Diagnosis present

## 2021-05-07 DIAGNOSIS — W19XXXA Unspecified fall, initial encounter: Secondary | ICD-10-CM | POA: Diagnosis present

## 2021-05-07 DIAGNOSIS — E039 Hypothyroidism, unspecified: Secondary | ICD-10-CM | POA: Diagnosis present

## 2021-05-07 DIAGNOSIS — F0390 Unspecified dementia without behavioral disturbance: Secondary | ICD-10-CM | POA: Diagnosis present

## 2021-05-07 HISTORY — PX: INTRAMEDULLARY (IM) NAIL INTERTROCHANTERIC: SHX5875

## 2021-05-07 LAB — CBC WITH DIFFERENTIAL/PLATELET
Abs Immature Granulocytes: 0.08 10*3/uL — ABNORMAL HIGH (ref 0.00–0.07)
Basophils Absolute: 0 10*3/uL (ref 0.0–0.1)
Basophils Relative: 0 %
Eosinophils Absolute: 0.1 10*3/uL (ref 0.0–0.5)
Eosinophils Relative: 0 %
HCT: 34.5 % — ABNORMAL LOW (ref 36.0–46.0)
Hemoglobin: 11.1 g/dL — ABNORMAL LOW (ref 12.0–15.0)
Immature Granulocytes: 1 %
Lymphocytes Relative: 9 %
Lymphs Abs: 1.6 10*3/uL (ref 0.7–4.0)
MCH: 28.2 pg (ref 26.0–34.0)
MCHC: 32.2 g/dL (ref 30.0–36.0)
MCV: 87.6 fL (ref 80.0–100.0)
Monocytes Absolute: 1 10*3/uL (ref 0.1–1.0)
Monocytes Relative: 6 %
Neutro Abs: 14.5 10*3/uL — ABNORMAL HIGH (ref 1.7–7.7)
Neutrophils Relative %: 84 %
Platelets: 246 10*3/uL (ref 150–400)
RBC: 3.94 MIL/uL (ref 3.87–5.11)
RDW: 14.6 % (ref 11.5–15.5)
WBC: 17.3 10*3/uL — ABNORMAL HIGH (ref 4.0–10.5)
nRBC: 0 % (ref 0.0–0.2)

## 2021-05-07 LAB — COMPREHENSIVE METABOLIC PANEL
ALT: 15 U/L (ref 0–44)
AST: 29 U/L (ref 15–41)
Albumin: 3.5 g/dL (ref 3.5–5.0)
Alkaline Phosphatase: 53 U/L (ref 38–126)
Anion gap: 8 (ref 5–15)
BUN: 29 mg/dL — ABNORMAL HIGH (ref 8–23)
CO2: 28 mmol/L (ref 22–32)
Calcium: 8.8 mg/dL — ABNORMAL LOW (ref 8.9–10.3)
Chloride: 99 mmol/L (ref 98–111)
Creatinine, Ser: 0.97 mg/dL (ref 0.44–1.00)
GFR, Estimated: 55 mL/min — ABNORMAL LOW (ref >60)
Glucose, Bld: 118 mg/dL — ABNORMAL HIGH (ref 70–99)
Potassium: 3.9 mmol/L (ref 3.5–5.1)
Sodium: 135 mmol/L (ref 135–145)
Total Bilirubin: 0.6 mg/dL (ref 0.3–1.2)
Total Protein: 6.4 g/dL — ABNORMAL LOW (ref 6.5–8.1)

## 2021-05-07 LAB — ABO/RH: ABO/RH(D): O NEG

## 2021-05-07 LAB — RESP PANEL BY RT-PCR (FLU A&B, COVID) ARPGX2
Influenza A by PCR: NEGATIVE
Influenza B by PCR: NEGATIVE
SARS Coronavirus 2 by RT PCR: NEGATIVE

## 2021-05-07 LAB — TROPONIN I (HIGH SENSITIVITY)
Troponin I (High Sensitivity): 14 ng/L (ref <18)
Troponin I (High Sensitivity): 15 ng/L (ref <18)

## 2021-05-07 SURGERY — FIXATION, FRACTURE, INTERTROCHANTERIC, WITH INTRAMEDULLARY ROD
Anesthesia: General | Site: Hip | Laterality: Left

## 2021-05-07 MED ORDER — HYDROCODONE-ACETAMINOPHEN 5-325 MG PO TABS: 1.0000 | ORAL_TABLET | ORAL | Status: DC | PRN

## 2021-05-07 MED ORDER — EPHEDRINE 5 MG/ML INJ
INTRAVENOUS | Status: AC
  Filled 2021-05-07: qty 5

## 2021-05-07 MED ORDER — SUCCINYLCHOLINE CHLORIDE 200 MG/10ML IV SOSY
PREFILLED_SYRINGE | INTRAVENOUS | Status: AC
  Filled 2021-05-07: qty 20

## 2021-05-07 MED ORDER — DOCUSATE SODIUM 100 MG PO CAPS
100.0000 mg | ORAL_CAPSULE | Freq: Two times a day (BID) | ORAL | Status: DC
  Administered 2021-05-08 – 2021-05-11 (×6): 100 mg via ORAL
  Filled 2021-05-07 (×6): qty 1

## 2021-05-07 MED ORDER — LABETALOL HCL 5 MG/ML IV SOLN
20.0000 mg | INTRAVENOUS | Status: DC | PRN
  Filled 2021-05-07: qty 4

## 2021-05-07 MED ORDER — PROPOFOL 10 MG/ML IV BOLUS
INTRAVENOUS | Status: DC | PRN
  Administered 2021-05-07: 100 mg via INTRAVENOUS
  Administered 2021-05-07: 30 mg via INTRAVENOUS

## 2021-05-07 MED ORDER — DEXAMETHASONE SODIUM PHOSPHATE 10 MG/ML IJ SOLN
INTRAMUSCULAR | Status: AC
  Filled 2021-05-07: qty 1

## 2021-05-07 MED ORDER — GLYCOPYRROLATE 0.2 MG/ML IJ SOLN
INTRAMUSCULAR | Status: AC
  Filled 2021-05-07: qty 2

## 2021-05-07 MED ORDER — MENTHOL 3 MG MT LOZG
1.0000 | LOZENGE | OROMUCOSAL | Status: DC | PRN
  Filled 2021-05-07: qty 9

## 2021-05-07 MED ORDER — LEVOTHYROXINE SODIUM 50 MCG PO TABS
125.0000 ug | ORAL_TABLET | Freq: Every day | ORAL | Status: DC
  Administered 2021-05-09 – 2021-05-11 (×3): 125 ug via ORAL
  Filled 2021-05-07 (×3): qty 1

## 2021-05-07 MED ORDER — ENOXAPARIN SODIUM 40 MG/0.4ML IJ SOSY: 40.0000 mg | PREFILLED_SYRINGE | INTRAMUSCULAR | Status: DC

## 2021-05-07 MED ORDER — METHOCARBAMOL 500 MG PO TABS: 500.0000 mg | ORAL_TABLET | Freq: Four times a day (QID) | ORAL | Status: DC | PRN

## 2021-05-07 MED ORDER — ONDANSETRON HCL 4 MG PO TABS: 4.0000 mg | ORAL_TABLET | Freq: Four times a day (QID) | ORAL | Status: DC | PRN

## 2021-05-07 MED ORDER — LOSARTAN POTASSIUM 50 MG PO TABS
50.0000 mg | ORAL_TABLET | Freq: Every evening | ORAL | Status: DC
  Administered 2021-05-08 – 2021-05-09 (×2): 50 mg via ORAL
  Filled 2021-05-07 (×3): qty 1

## 2021-05-07 MED ORDER — FENTANYL CITRATE (PF) 100 MCG/2ML IJ SOLN
INTRAMUSCULAR | Status: DC | PRN
  Administered 2021-05-07 (×3): 25 ug via INTRAVENOUS
  Administered 2021-05-07: 50 ug via INTRAVENOUS

## 2021-05-07 MED ORDER — ALUM & MAG HYDROXIDE-SIMETH 200-200-20 MG/5ML PO SUSP: 30.0000 mL | ORAL | Status: DC | PRN

## 2021-05-07 MED ORDER — FENTANYL CITRATE (PF) 100 MCG/2ML IJ SOLN
INTRAMUSCULAR | Status: AC
  Filled 2021-05-07: qty 2

## 2021-05-07 MED ORDER — CEFAZOLIN SODIUM-DEXTROSE 2-4 GM/100ML-% IV SOLN
2.0000 g | INTRAVENOUS | Status: AC
  Administered 2021-05-07: 2 g via INTRAVENOUS
  Filled 2021-05-07: qty 100

## 2021-05-07 MED ORDER — LEVOTHYROXINE SODIUM 50 MCG PO TABS: 125.0000 ug | ORAL_TABLET | Freq: Every day | ORAL | Status: DC

## 2021-05-07 MED ORDER — MORPHINE SULFATE (PF) 2 MG/ML IV SOLN: 0.5000 mg | INTRAVENOUS | Status: DC | PRN

## 2021-05-07 MED ORDER — HYDRALAZINE HCL 20 MG/ML IJ SOLN
10.0000 mg | INTRAMUSCULAR | Status: DC | PRN
  Filled 2021-05-07: qty 0.5

## 2021-05-07 MED ORDER — ROCURONIUM BROMIDE 100 MG/10ML IV SOLN
INTRAVENOUS | Status: DC | PRN
  Administered 2021-05-07: 20 mg via INTRAVENOUS
  Administered 2021-05-07: 50 mg via INTRAVENOUS

## 2021-05-07 MED ORDER — ONDANSETRON HCL 4 MG/2ML IJ SOLN: 4.0000 mg | Freq: Four times a day (QID) | INTRAMUSCULAR | Status: DC | PRN

## 2021-05-07 MED ORDER — TRAMADOL HCL 50 MG PO TABS
50.0000 mg | ORAL_TABLET | Freq: Four times a day (QID) | ORAL | Status: DC
  Administered 2021-05-08 – 2021-05-09 (×5): 50 mg via ORAL
  Filled 2021-05-07 (×5): qty 1

## 2021-05-07 MED ORDER — FENTANYL CITRATE (PF) 100 MCG/2ML IJ SOLN
25.0000 ug | INTRAMUSCULAR | Status: DC | PRN
  Administered 2021-05-07 (×2): 25 ug via INTRAVENOUS

## 2021-05-07 MED ORDER — METHOCARBAMOL 1000 MG/10ML IJ SOLN
500.0000 mg | Freq: Four times a day (QID) | INTRAVENOUS | Status: DC | PRN
  Filled 2021-05-07: qty 5

## 2021-05-07 MED ORDER — PROPOFOL 10 MG/ML IV BOLUS
INTRAVENOUS | Status: AC
  Filled 2021-05-07: qty 20

## 2021-05-07 MED ORDER — ACETAMINOPHEN 325 MG PO TABS
325.0000 mg | ORAL_TABLET | Freq: Four times a day (QID) | ORAL | Status: DC | PRN
  Administered 2021-05-08 (×2): 650 mg via ORAL
  Filled 2021-05-07 (×2): qty 2

## 2021-05-07 MED ORDER — HYDRALAZINE HCL 20 MG/ML IJ SOLN
10.0000 mg | Freq: Once | INTRAMUSCULAR | Status: AC
Start: 2021-05-07 — End: 2021-05-07

## 2021-05-07 MED ORDER — PHENYLEPHRINE HCL-NACL 20-0.9 MG/250ML-% IV SOLN
INTRAVENOUS | Status: DC | PRN
  Administered 2021-05-07: 20 ug/min via INTRAVENOUS

## 2021-05-07 MED ORDER — PHENOL 1.4 % MT LIQD
1.0000 | OROMUCOSAL | Status: DC | PRN
  Filled 2021-05-07: qty 177

## 2021-05-07 MED ORDER — CHLORHEXIDINE GLUCONATE CLOTH 2 % EX PADS
6.0000 | MEDICATED_PAD | Freq: Every day | CUTANEOUS | Status: DC
  Administered 2021-05-07 – 2021-05-11 (×5): 6 via TOPICAL

## 2021-05-07 MED ORDER — LIDOCAINE HCL (PF) 2 % IJ SOLN
INTRAMUSCULAR | Status: AC
  Filled 2021-05-07: qty 20

## 2021-05-07 MED ORDER — CEFAZOLIN SODIUM-DEXTROSE 2-4 GM/100ML-% IV SOLN
2.0000 g | Freq: Four times a day (QID) | INTRAVENOUS | Status: AC
  Administered 2021-05-07 – 2021-05-08 (×2): 2 g via INTRAVENOUS
  Filled 2021-05-07 (×2): qty 100

## 2021-05-07 MED ORDER — PHENYLEPHRINE 40 MCG/ML (10ML) SYRINGE FOR IV PUSH (FOR BLOOD PRESSURE SUPPORT)
PREFILLED_SYRINGE | INTRAVENOUS | Status: DC | PRN
  Administered 2021-05-07 (×4): 80 ug via INTRAVENOUS

## 2021-05-07 MED ORDER — ROCURONIUM BROMIDE 10 MG/ML (PF) SYRINGE
PREFILLED_SYRINGE | INTRAVENOUS | Status: AC
  Filled 2021-05-07: qty 10

## 2021-05-07 MED ORDER — HYDRALAZINE HCL 20 MG/ML IJ SOLN
INTRAMUSCULAR | Status: AC
  Administered 2021-05-07: 10 mg via INTRAVENOUS
  Filled 2021-05-07: qty 1

## 2021-05-07 MED ORDER — ADULT MULTIVITAMIN W/MINERALS CH
1.0000 | ORAL_TABLET | Freq: Every day | ORAL | Status: DC
  Administered 2021-05-08 – 2021-05-11 (×3): 1 via ORAL
  Filled 2021-05-07 (×3): qty 1

## 2021-05-07 MED ORDER — LIDOCAINE HCL (CARDIAC) PF 100 MG/5ML IV SOSY
PREFILLED_SYRINGE | INTRAVENOUS | Status: DC | PRN
  Administered 2021-05-07: 80 mg via INTRAVENOUS

## 2021-05-07 MED ORDER — LEVOTHYROXINE SODIUM 88 MCG PO TABS: 88.0000 ug | ORAL_TABLET | Freq: Every day | ORAL | Status: DC

## 2021-05-07 MED ORDER — SUGAMMADEX SODIUM 200 MG/2ML IV SOLN
INTRAVENOUS | Status: DC | PRN
  Administered 2021-05-07: 200 mg via INTRAVENOUS

## 2021-05-07 MED ORDER — POLYETHYLENE GLYCOL 3350 17 G PO PACK: 17.0000 g | PACK | Freq: Every day | ORAL | Status: DC | PRN

## 2021-05-07 MED ORDER — 0.9 % SODIUM CHLORIDE (POUR BTL) OPTIME
TOPICAL | Status: DC | PRN
  Administered 2021-05-07: 1000 mL

## 2021-05-07 MED ORDER — ACETAMINOPHEN 500 MG PO TABS
500.0000 mg | ORAL_TABLET | Freq: Four times a day (QID) | ORAL | Status: AC
  Administered 2021-05-08: 500 mg via ORAL
  Filled 2021-05-07: qty 1

## 2021-05-07 MED ORDER — HYDROCODONE-ACETAMINOPHEN 5-325 MG PO TABS: 1.0000 | ORAL_TABLET | Freq: Four times a day (QID) | ORAL | Status: DC | PRN

## 2021-05-07 MED ORDER — SODIUM CHLORIDE 0.9 % IV SOLN: INTRAVENOUS | Status: AC

## 2021-05-07 MED ORDER — ONDANSETRON HCL 4 MG/2ML IJ SOLN
INTRAMUSCULAR | Status: AC
  Filled 2021-05-07: qty 4

## 2021-05-07 MED ORDER — SENNA 8.6 MG PO TABS
1.0000 | ORAL_TABLET | Freq: Two times a day (BID) | ORAL | Status: DC
  Administered 2021-05-08 – 2021-05-11 (×6): 8.6 mg via ORAL
  Filled 2021-05-07 (×6): qty 1

## 2021-05-07 MED ORDER — FENTANYL CITRATE PF 50 MCG/ML IJ SOSY
25.0000 ug | PREFILLED_SYRINGE | Freq: Once | INTRAMUSCULAR | Status: AC
  Administered 2021-05-07: 25 ug via INTRAVENOUS
  Filled 2021-05-07: qty 1

## 2021-05-07 MED ORDER — BISACODYL 10 MG RE SUPP
10.0000 mg | Freq: Every day | RECTAL | Status: DC | PRN
  Administered 2021-05-11: 10 mg via RECTAL
  Filled 2021-05-07: qty 1

## 2021-05-07 MED ORDER — ONDANSETRON HCL 4 MG/2ML IJ SOLN
INTRAMUSCULAR | Status: DC | PRN
  Administered 2021-05-07: 4 mg via INTRAVENOUS

## 2021-05-07 SURGICAL SUPPLY — 39 items
BIT DRILL CROWE POINT TWST 4.3 (DRILL) IMPLANT
BNDG COHESIVE 6X5 TAN ST LF (GAUZE/BANDAGES/DRESSINGS) ×4 IMPLANT
DRAPE 3/4 80X56 (DRAPES) ×4 IMPLANT
DRAPE SURG 17X11 SM STRL (DRAPES) ×4 IMPLANT
DRAPE U-SHAPE 47X51 STRL (DRAPES) ×4 IMPLANT
DRILL CROWE POINT TWIST 4.3 (DRILL) ×2
DRSG OPSITE POSTOP 3X4 (GAUZE/BANDAGES/DRESSINGS) ×2 IMPLANT
DRSG OPSITE POSTOP 4X6 (GAUZE/BANDAGES/DRESSINGS) ×1 IMPLANT
DURAPREP 26ML APPLICATOR (WOUND CARE) ×4 IMPLANT
ELECT REM PT RETURN 9FT ADLT (ELECTROSURGICAL) ×2
ELECTRODE REM PT RTRN 9FT ADLT (ELECTROSURGICAL) ×1 IMPLANT
GLOVE SURG ORTHO LTX SZ9 (GLOVE) ×4 IMPLANT
GLOVE SURG UNDER POLY LF SZ9 (GLOVE) ×2 IMPLANT
GOWN STRL REUS TWL 2XL XL LVL4 (GOWN DISPOSABLE) ×2 IMPLANT
GOWN STRL REUS W/ TWL LRG LVL3 (GOWN DISPOSABLE) ×1 IMPLANT
GOWN STRL REUS W/TWL LRG LVL3 (GOWN DISPOSABLE) ×2
GUIDEPIN VERSANAIL DSP 3.2X444 (ORTHOPEDIC DISPOSABLE SUPPLIES) ×1 IMPLANT
GUIDEWIRE NATURAL NAIL 3X100 (WIRE) ×1 IMPLANT
HFN LH 130 DEG 9MM X 360MM (Nail) ×1 IMPLANT
HIP FRAC NAIL LAG SCR 10.5X100 (Orthopedic Implant) ×1 IMPLANT
KIT TURNOVER CYSTO (KITS) ×2 IMPLANT
MANIFOLD NEPTUNE II (INSTRUMENTS) ×2 IMPLANT
MAT ABSORB  FLUID 56X50 GRAY (MISCELLANEOUS) ×1
MAT ABSORB FLUID 56X50 GRAY (MISCELLANEOUS) ×1 IMPLANT
NS IRRIG 1000ML POUR BTL (IV SOLUTION) ×1 IMPLANT
PACK HIP COMPR (MISCELLANEOUS) ×2 IMPLANT
PAD ARMBOARD 7.5X6 YLW CONV (MISCELLANEOUS) ×3 IMPLANT
SCREW BONE CORTICAL 5.0X38 (Screw) ×1 IMPLANT
SCREW BONE CORTICAL 5.0X40 (Screw) ×1 IMPLANT
SCREW CANN THRD AFF 10.5X100 (Orthopedic Implant) IMPLANT
STAPLER SKIN PROX 35W (STAPLE) ×2 IMPLANT
SUCTION FRAZIER HANDLE 10FR (MISCELLANEOUS) ×1
SUCTION TUBE FRAZIER 10FR DISP (MISCELLANEOUS) ×1 IMPLANT
SUT VIC AB 0 CT1 36 (SUTURE) ×5 IMPLANT
SUT VIC AB 2-0 CT1 27 (SUTURE) ×2
SUT VIC AB 2-0 CT1 TAPERPNT 27 (SUTURE) ×1 IMPLANT
SUT VICRYL 0 AB UR-6 (SUTURE) ×2 IMPLANT
SYR 30ML LL (SYRINGE) ×2 IMPLANT
WATER STERILE IRR 500ML POUR (IV SOLUTION) ×2 IMPLANT

## 2021-05-07 NOTE — Transfer of Care (Signed)
Immediate Anesthesia Transfer of Care Note  Patient: Michele Lambert  Procedure(s) Performed: INTRAMEDULLARY (IM) NAIL INTERTROCHANTRIC (Left: Hip)  Patient Location: PACU  Anesthesia Type:General  Level of Consciousness: drowsy  Airway & Oxygen Therapy: Patient Spontanous Breathing and Patient connected to face mask oxygen  Post-op Assessment: Report given to RN and Post -op Vital signs reviewed and stable  Post vital signs: Reviewed and stable  Last Vitals:  Vitals Value Taken Time  BP 141/64 05/07/21 1440  Temp    Pulse 66 05/07/21 1442  Resp 10 05/07/21 1442  SpO2 100 % 05/07/21 1442  Vitals shown include unvalidated device data.  Last Pain:  Vitals:   05/07/21 1148  TempSrc: Temporal  PainSc:          Complications: No notable events documented.

## 2021-05-07 NOTE — ED Notes (Signed)
Dr. Sung at the bedside °

## 2021-05-07 NOTE — Assessment & Plan Note (Signed)
Neurosurgery consulted from the ED with recommendation to repeat CT head in 6 hours ED provider spoke with son who will not want intervention even if CT head shows worsening

## 2021-05-07 NOTE — Progress Notes (Signed)
Per speech therapist - Dani Gobble, She have not evaluate pt today so she call and recommended " do not give anything orally till she come back tomorrow, all meds must be IV" So she can evaluate her. Judd Gaudier, MD and Si Raider, MD notified.

## 2021-05-07 NOTE — Anesthesia Procedure Notes (Signed)
Procedure Name: Intubation Date/Time: 05/07/2021 12:59 PM Performed by: Lily Peer, Adriana Lina, CRNA Pre-anesthesia Checklist: Patient identified, Emergency Drugs available, Suction available and Patient being monitored Patient Re-evaluated:Patient Re-evaluated prior to induction Oxygen Delivery Method: Circle system utilized Preoxygenation: Pre-oxygenation with 100% oxygen Induction Type: IV induction Ventilation: Mask ventilation without difficulty Laryngoscope Size: McGraph and 3 Grade View: Grade I Tube type: Oral Tube size: 6.5 mm Number of attempts: 1 Airway Equipment and Method: Stylet Placement Confirmation: ETT inserted through vocal cords under direct vision, positive ETCO2 and breath sounds checked- equal and bilateral Secured at: 20 cm Tube secured with: Tape Dental Injury: Teeth and Oropharynx as per pre-operative assessment

## 2021-05-07 NOTE — Op Note (Signed)
05/07/2021  2:49 PM  PATIENT:  Michele Lambert    PRE-OPERATIVE DIAGNOSIS: Displaced left Intertrochanteric Hip Fracture  POST-OPERATIVE DIAGNOSIS:  Same  PROCEDURE: Intramedullary fixation of left intertrochanteric hip fracture  SURGEON:  Thornton Park, MD  ANESTHESIA:   General  EBL: 100 cc  IMPLANT:  ZIMMER BIOMET AFFIXUS NAIL 9 mm x 360 mm with a 100 mm lag screw and distal interlocking screws 38 mm  and 40 mm in length.  PREOPERATIVE INDICATIONS:  Michele Lambert is a  88 y.o. female with a diagnosis of a closed comminuted and displaced left Interchroanteric Hip Fracture.  Given the displacement of the patient's fracture I recommended intramedullary fixation.  The risks, benefits and alternatives were discussed with the patient and her son.  The risks include but are not limited to infection, bleeding requiring blood transfusion, nerve or blood vessel injury, malunion, nonunion, hardware prominence, hardware failure, leg length discrepancy or change in lower extremity rotation and need for further surgery including hardware removal with conversion to a total hip arthroplasty. Medical risks include but are not limited to DVT and pulmonary embolism, myocardial infarction, stroke, pneumonia, respiratory failure and death.  Patient's son understood these risks and agreed with the plan for surgery.    OPERATIVE PROCEDURE:  The patient was brought to the operating room and placed in the supine position on the fracture table. The patient received general anesthesia.  A closed reduction was performed under C-arm guidance.  The fracture reduction was confirmed on both AP and lateral views. After adequate reduction was achieved, a time out was performed to verify the patient's name, date of birth, medical record number, correct site of surgery correct procedure to be performed. The timeout was also used to verify the patient received antibiotics and all appropriate instruments, implants and  radiographic studies were available in the room. Once all in attendance were in agreement, the case began. The patient was prepped and draped in a sterile fashion.  She received preoperative antibiotics with 2 g of Ancef IV.  An incision was made proximal to the greater trochanter in line with the femur. A guidewire was placed over the tip of the greater trochanter and advanced by drill into the proximal femur to the level of the lesser trochanter.  Confirmation of the drill pin position was made on AP and lateral C-arm images.  The threaded guidepin was then overdrilled with the proximal femoral entry reamer.  A ball-tipped guidewire was then advanced down the intramedullary canal, across the fracture, and down the femoral shaft to the knee.  The ball tip guidewire's position was confirmed at both the knee and hip via C-arm imaging. A depth gauge was used to measure the length of the long nail to be used. It was measured to be 360 mm. The actual nail was then inserted into the proximal femur, across the fracture site and down the femoral shaft. Its position was confirmed on AP and lateral C-arm images.  The ball tip guidewire was removed.  Once the nail was completely seated, the drill guide for the lag screw was placed through the guide arm for the Affixus nail. A guidepin was then placed through this drill guide and advanced through the lateral cortex of the femur, across the fracture site and into the femoral head achieving a tip apex distance of less than 25 mm. The length of the drill pin was measured to be 100 mm, and then the drill for the lag screw was advanced through  the lateral cortex, across the fracture site and up into the femoral head to the depth of the lag screw.  The lag screw was then advanced by hand into position across the fracture site into the femoral head. Its final position was confirmed on AP and lateral C-arm images. Compression was applied as traction was carefully released. The set  screw in the top of the intramedullary rod was tightened by hand using a screwdriver. It was backed off a quarter turn to allow for compression at the fracture site.  The attention was then turned to placement of the distal interlocking screws. A perfect circle technique was used. 2 small stab incisions were made over the distal interlocking screw holes.  A free hand technique was used to drill both distal interlocking screws. The depth of the screw holes was measured with a depth gauge. The 38 mm and 40 mm screws were then advanced into position and tightened by hand. Final C-arm images of the entire intramedullary construct were taken in both the AP and lateral planes.   The wounds were irrigated copiously and closed with 0 Vicryl for closure of the deep fascia and 2-0 Vicryl for subcutaneous closure. The skin was approximated with staples. A dry sterile dressing was applied. I was scrubbed and present the entire case and all sharp, sponge and instrument counts were correct at the conclusion of the case. Patient was transferred to a hospital bed and brought to PACU in stable condition.     Timoteo Gaul, MD

## 2021-05-07 NOTE — ED Notes (Signed)
Attempted straight stick using a 23 gauge butterfly to obtain blood without success. Lab called to obtain and MD aware.

## 2021-05-07 NOTE — Progress Notes (Addendum)
Subjective:  POST OP CHECK s/p intramedullary fixation of left intertrochanteric hip fracture.   Patient resting comfortably. Patient was not disturbed from her sleep.  Her family is at the bedside.  RN in the room.    Objective:   VITALS:   Vitals:   05/07/21 1705 05/07/21 1710 05/07/21 1720 05/07/21 1720  BP:  109/63  118/72  Pulse: 81 86 92 95  Resp: 10 14 18 19   Temp:    98.2 F (36.8 C)  TempSrc:      SpO2: 100% 100% 98%   Weight:      Height:        PHYSICAL EXAM: Left lower extremity: Dressings clean dry and intact.  No erythema ecchymosis or significant swelling.  Patient has palpable pedal pulses.  LABS  Results for orders placed or performed during the hospital encounter of 05/07/21 (from the past 24 hour(s))  Resp Panel by RT-PCR (Flu A&B, Covid) Nasopharyngeal Swab     Status: None   Collection Time: 05/07/21  3:26 AM   Specimen: Nasopharyngeal Swab; Nasopharyngeal(NP) swabs in vial transport medium  Result Value Ref Range   SARS Coronavirus 2 by RT PCR NEGATIVE NEGATIVE   Influenza A by PCR NEGATIVE NEGATIVE   Influenza B by PCR NEGATIVE NEGATIVE  Troponin I (High Sensitivity)     Status: None   Collection Time: 05/07/21  7:07 AM  Result Value Ref Range   Troponin I (High Sensitivity) 14 <18 ng/L  CBC with Differential     Status: Abnormal   Collection Time: 05/07/21  9:41 AM  Result Value Ref Range   WBC 17.3 (H) 4.0 - 10.5 K/uL   RBC 3.94 3.87 - 5.11 MIL/uL   Hemoglobin 11.1 (L) 12.0 - 15.0 g/dL   HCT 34.5 (L) 36.0 - 46.0 %   MCV 87.6 80.0 - 100.0 fL   MCH 28.2 26.0 - 34.0 pg   MCHC 32.2 30.0 - 36.0 g/dL   RDW 14.6 11.5 - 15.5 %   Platelets 246 150 - 400 K/uL   nRBC 0.0 0.0 - 0.2 %   Neutrophils Relative % 84 %   Neutro Abs 14.5 (H) 1.7 - 7.7 K/uL   Lymphocytes Relative 9 %   Lymphs Abs 1.6 0.7 - 4.0 K/uL   Monocytes Relative 6 %   Monocytes Absolute 1.0 0.1 - 1.0 K/uL   Eosinophils Relative 0 %   Eosinophils Absolute 0.1 0.0 - 0.5 K/uL    Basophils Relative 0 %   Basophils Absolute 0.0 0.0 - 0.1 K/uL   Immature Granulocytes 1 %   Abs Immature Granulocytes 0.08 (H) 0.00 - 0.07 K/uL  Comprehensive metabolic panel     Status: Abnormal   Collection Time: 05/07/21  9:41 AM  Result Value Ref Range   Sodium 135 135 - 145 mmol/L   Potassium 3.9 3.5 - 5.1 mmol/L   Chloride 99 98 - 111 mmol/L   CO2 28 22 - 32 mmol/L   Glucose, Bld 118 (H) 70 - 99 mg/dL   BUN 29 (H) 8 - 23 mg/dL   Creatinine, Ser 0.97 0.44 - 1.00 mg/dL   Calcium 8.8 (L) 8.9 - 10.3 mg/dL   Total Protein 6.4 (L) 6.5 - 8.1 g/dL   Albumin 3.5 3.5 - 5.0 g/dL   AST 29 15 - 41 U/L   ALT 15 0 - 44 U/L   Alkaline Phosphatase 53 38 - 126 U/L   Total Bilirubin 0.6 0.3 - 1.2 mg/dL  GFR, Estimated 55 (L) >60 mL/min   Anion gap 8 5 - 15  Troponin I (High Sensitivity)     Status: None   Collection Time: 05/07/21  9:41 AM  Result Value Ref Range   Troponin I (High Sensitivity) 15 <18 ng/L  ABO/Rh     Status: None   Collection Time: 05/07/21  9:41 AM  Result Value Ref Range   ABO/RH(D)      Jenetta Downer NEG Performed at [facility], [address] [place], [place] [address]   Type and screen [place]     Status: None   Collection Time: 05/07/21 12:02 PM  Result Value Ref Range   ABO/RH(D) O NEG    Antibody Screen NEG    Sample Expiration      05/10/2021,2359 Performed at [facility], [address], [place], [place] [address]     CT HEAD WO CONTRAST (5MM)  Result Date: 05/07/2021 CLINICAL DATA:  Intraparenchymal hemorrhage. EXAM: CT HEAD WITHOUT CONTRAST TECHNIQUE: Contiguous axial images were obtained from the base of the skull through the vertex without intravenous contrast. RADIATION DOSE REDUCTION: This exam was performed according to the departmental dose-optimization program which includes automated exposure control, adjustment of the mA and/or kV according to patient size and/or use of iterative reconstruction technique.  COMPARISON:  Of same day. FINDINGS: Brain: Grossly stable size and appearance of right basal ganglia hemorrhage with hemorrhage present in bilateral lateral and third ventricles as well. Stable ventricular size is noted. Cortical atrophy is again noted. No mass effect or midline shift is noted. No new hemorrhage is noted. Vascular: No hyperdense vessel or unexpected calcification. Skull: Normal. Negative for fracture or focal lesion. Sinuses/Orbits: No acute finding. Other: None. IMPRESSION: Stable size and appearance of right basal ganglia intraparenchymal hemorrhage as well as intraventricular hemorrhage involving both lateral ventricles and third ventricle. Electronically Signed   By: Marijo Conception M.D.   On: 05/07/2021 16:01   CT Head Wo Contrast  Result Date: 05/07/2021 CLINICAL DATA:  Fall, intracranial hemorrhage EXAM: CT HEAD WITHOUT CONTRAST TECHNIQUE: Contiguous axial images were obtained from the base of the skull through the vertex without intravenous contrast. RADIATION DOSE REDUCTION: This exam was performed according to the departmental dose-optimization program which includes automated exposure control, adjustment of the mA and/or kV according to patient size and/or use of iterative reconstruction technique. COMPARISON:  CT head obtained earlier the same day FINDINGS: Brain: Again seen is small volume intraparenchymal hemorrhage centered in the right thalamus and anterior limb of the internal capsule. There is no significant regional mass effect. Intraventricular blood in the bodies of the lateral ventricles, third ventricle, and layering in the occipital horns is again seen. Blood in the occipital horns appears slightly increased from the prior study, though this may reflect redistribution as previously seen blood in the right temporal horn is no longer seen. Blood elsewhere appears unchanged. The ventricular system is unchanged in size and configuration, with no evidence of developing  hydrocephalus. Background parenchymal volume is unchanged. Patchy hypodensity in the subcortical and periventricular white matter likely reflecting sequela of chronic white matter microangiopathy is unchanged. There is no evidence of infarct. Vascular: There is calcification of the bilateral cavernous ICAs. Skull: Normal. Negative for fracture or focal lesion. Sinuses/Orbits: The imaged paranasal sinuses are clear. Bilateral lens implants are in place. The globes and orbits are otherwise unremarkable. Other: None. IMPRESSION: 1. Intraventricular blood layering in the occipital horns appears slightly increased since the  prior study, though this may reflect redistribution. Additional intraventricular and intraparenchymal blood products are not significantly changed. 2. Stable size and configuration of the ventricular system. Electronically Signed   By: Valetta Mole M.D.   On: 05/07/2021 09:24   CT Head Wo Contrast  Result Date: 05/07/2021 CLINICAL DATA:  Head trauma, fall EXAM: CT HEAD WITHOUT CONTRAST TECHNIQUE: Contiguous axial images were obtained from the base of the skull through the vertex without intravenous contrast. RADIATION DOSE REDUCTION: This exam was performed according to the departmental dose-optimization program which includes automated exposure control, adjustment of the mA and/or kV according to patient size and/or use of iterative reconstruction technique. COMPARISON:  08/07/2020 FINDINGS: Brain: Intraparenchymal hemorrhage centered in the right basal ganglia. Minimal hypodensity surrounding the basal ganglia portion of the hemorrhage, likely edema. Intraventricular extension into the right-greater-than-left lateral ventricle and third ventricle. Small amount of hemorrhage in the occipital horn of the left lateral ventricle and temporal horn of the right lateral ventricle. Unchanged size and configuration of the ventricles. No significant mass effect or midline shift. No acute infarct or mass.  No additional extra-axial collection. Periventricular white matter changes, likely the sequela of chronic small vessel ischemic disease. Vascular: No hyperdense vessel. Skull: Normal. Negative for fracture or focal lesion. Sinuses/Orbits: Bubbly fluid in the right sphenoid sinus. Status post bilateral lens replacements. Other: The mastoids are well aerated. IMPRESSION: 1. Intraparenchymal hemorrhage centered in the right basal ganglia, with intraventricular extension into the right-greater-than-left lateral ventricle and third ventricle, without evidence of hydrocephalus, significant mass effect, or midline shift. 2. Bubbly fluid in the right sphenoid sinus, as can be seen in the setting of sinusitis. Correlate with symptoms. These results were called by telephone at the time of interpretation on 05/07/2021 at 3:07 am to provider JADE SUNG , who verbally acknowledged these results. Electronically Signed   By: Merilyn Baba M.D.   On: 05/07/2021 03:10   DG Chest Port 1 View  Result Date: 05/07/2021 CLINICAL DATA:  Recent fall with chest pain, initial encounter EXAM: PORTABLE CHEST 1 VIEW COMPARISON:  08/07/2020 FINDINGS: Cardiac shadow is enlarged but stable. Aortic calcifications are noted. Distal left clavicular fracture is seen. The lungs are clear bilaterally. No other fractures are noted. IMPRESSION: Distal left clavicular fracture. No acute abnormality is noted in the chest. Electronically Signed   By: Inez Catalina M.D.   On: 05/07/2021 03:20   DG C-Arm 1-60 Min-No Report  Result Date: 05/07/2021 CLINICAL DATA:  Left hip fracture. EXAM: DG HIP (WITH OR WITHOUT PELVIS) 2-3V LEFT; DG C-ARM 1-60 MIN-NO REPORT Fluoroscopy time: 1 minute 38 seconds. COMPARISON:  Same day. FINDINGS: Four intraoperative fluoroscopic images were obtained. These demonstrate intramedullary rod fixation proximal left femoral intertrochanteric fracture. Improved alignment of fracture components is noted. IMPRESSION: Fluoroscopic  guidance provided during intramedullary rod fixation of left femur for treatment of proximal left femoral intertrochanteric fracture. Electronically Signed   By: Marijo Conception M.D.   On: 05/07/2021 14:37   DG HIP UNILAT WITH PELVIS 2-3 VIEWS LEFT  Result Date: 05/07/2021 CLINICAL DATA:  Left hip fracture. EXAM: DG HIP (WITH OR WITHOUT PELVIS) 2-3V LEFT; DG C-ARM 1-60 MIN-NO REPORT Fluoroscopy time: 1 minute 38 seconds. COMPARISON:  Same day. FINDINGS: Four intraoperative fluoroscopic images were obtained. These demonstrate intramedullary rod fixation proximal left femoral intertrochanteric fracture. Improved alignment of fracture components is noted. IMPRESSION: Fluoroscopic guidance provided during intramedullary rod fixation of left femur for treatment of proximal left femoral intertrochanteric fracture. Electronically Signed  By: Marijo Conception M.D.   On: 05/07/2021 14:37   DG Hip Unilat With Pelvis 2-3 Views Left  Result Date: 05/07/2021 CLINICAL DATA:  Recent fall with left hip pain, initial encounter EXAM: DG HIP (WITH OR WITHOUT PELVIS) 3V LEFT COMPARISON:  None. FINDINGS: Pelvic ring is intact. Healed fractures are noted in the superior and inferior pubic rami on the right. Comminuted left intratrochanteric fracture is noted with impaction and angulation at the fracture site. No dislocation is seen. IMPRESSION: Comminuted left intratrochanteric fracture as described. Old healed fractures are noted involving the pubic rami on the right. Electronically Signed   By: Inez Catalina M.D.   On: 05/07/2021 03:27   DG FEMUR PORT MIN 2 VIEWS LEFT  Result Date: 05/07/2021 CLINICAL DATA:  Post hip fracture intramedullary nailing. EXAM: LEFT FEMUR PORTABLE 2 VIEWS COMPARISON:  May 07, 2021 preoperative evaluation. FINDINGS: Post antegrade nailing of a comminuted intratrochanteric fracture that was seen previously with placement of a intramedullary rod and hip screw crossing the femoral neck. Near anatomic  alignment. Distal interlocking screws are well noted as well, 2 distal interlocking screws. No unexpected findings with gas in the local soft tissues and skin staples over the LEFT hip and lower thigh. IMPRESSION: Post antegrade nailing of a comminuted intertrochanteric fracture, in near anatomic alignment and no unexpected findings. Electronically Signed   By: Zetta Bills M.D.   On: 05/07/2021 15:17    Assessment/Plan: Day of Surgery   Active Problems:   Hypothyroidism   Dementia [facility])   Traumatic intraparenchymal hemorrhage   Closed intertrochanteric fracture of left hip, initial encounter (HCC)   CKD (chronic kidney disease) stage 3, GFR 30-59 ml/min (HCC)   Age-related osteoporosis without current pathological fracture   Essential hypertension  Patient stable postop.  Patient will receive 24 hours of postop antibiotics.  She will begin physical therapy tomorrow.  Patient will have her Foley catheter removed in the morning.  Labs will be drawn in the morning.  Continue current pain management.  Patient may weight-bear as tolerated on the left lower extremity.  I reviewed the postoperative x-ray which demonstrates the left intertrochanteric hip fracture is well reduced.  The intramedullary hardware is well-positioned.  There is no evidence of postop complication.    Thornton Park , MD 05/07/2021, 6:19 PM

## 2021-05-07 NOTE — Progress Notes (Signed)
PROGRESS NOTE    Michele Lambert  ASN:[id number] DOB: 10-Mar-1927 [name]: 05/07/2021 PCP: Dion Body, MD  Outpatient Specialists: none    Brief Narrative:   Hx dementia, htn, hypothyroid, presented AM of 2/3 after unwitnessed fall at her nursing home. Staff heard a "thud" and found the patient on the floor complaining of left hip pain. At baseline ambulates with a walker. Found to have left intratrochanteric fracture as well as intraparenchymal hemorrhage on CT of brain.   Assessment & Plan:   Active Problems:   Hypothyroidism   Dementia ([place])   Traumatic intraparenchymal hemorrhage   Closed intertrochanteric fracture of left hip, initial encounter (HCC)   CKD (chronic kidney disease) stage 3, GFR 30-59 ml/min (HCC)   Age-related osteoporosis without current pathological fracture   Essential hypertension  # Left comminuted intratrochanteric fracture Neurovascularly intact. Lower than average pre-op risk assessment using ACS risk calculator. Also had TTE performed 3 weeks ago that was essentially normal. - NPO - ortho likely to proceed w/ surgery pending below issues  # Traumatic intraparenchymal hemorrhage CT shows intraparenchymal hemorrhage in right basal ganglia w/ intraventricular extension w/o evidence hydrocephalus, mass effect, or midline shift. Neurosurgery and neurology consulted. HCPOA told admitting providers would not want neurosurgical intervention - repeat CT of head has been ordered - will f/u formal neurosurg and neuro consults when available - maintain systolic below 193 - frequent neuro checks, seizure precautions - will probably proceed w/ ortho surgery if bleed stabilizes  # Hypothyroid - home synthroid  # HTN Here bp wnl - cont home losartan - hctz on hold, re-start as needed   DVT prophylaxis: SCDs Code Status: DNR Family Communication: son updated @ bedside  Level of care: Progressive Status is: Inpatient Remains inpatient appropriate  because: severity of illness            Consultants:  Ortho, neuro, neurosurg  Procedures: none  Antimicrobials:  none    Subjective: Denies pain  Objective: Vitals:   05/07/21 0530 05/07/21 0600 05/07/21 0630 05/07/21 0700  BP: (!) 148/73 136/73 (!) 151/73 139/70  Pulse: 81 77 79 79  Resp: 18 12 (!) 21 20  Temp:      TempSrc:      SpO2: 97% 95% 100% 99%  Weight:      Height:       No intake or output data in the 24 hours ending 05/07/21 0815 Filed Weights   05/07/21 0230  Weight: 68.4 kg    Examination:  General exam: Appears calm and comfortable  Respiratory system: Clear to auscultation. Respiratory effort normal. Cardiovascular system: S1 & S2 heard, RRR. No JVD, murmurs, rubs, gallops or clicks. 1+ pedal edema Gastrointestinal system: Abdomen is nondistended, soft and nontender. No organomegaly or masses felt. Normal bowel sounds heard. Central nervous system: sleepy but arouses, moves all 4 ext Extremities: warm Skin: No rashes, lesions or ulcers Psychiatry: calm, confused    Data Reviewed: I have personally reviewed following labs and imaging studies  CBC: No results for input(s): WBC, NEUTROABS, HGB, HCT, MCV, PLT in the last 168 hours. Basic Metabolic Panel: No results for input(s): NA, K, CL, CO2, GLUCOSE, BUN, CREATININE, CALCIUM, MG, PHOS in the last 168 hours. GFR: CrCl cannot be calculated (Patient's most recent lab result is older than the maximum 21 days allowed.). Liver Function Tests: No results for input(s): AST, ALT, ALKPHOS, BILITOT, PROT, ALBUMIN in the last 168 hours. No results for input(s): LIPASE, AMYLASE in the last 168 hours. No  results for input(s): AMMONIA in the last 168 hours. Coagulation Profile: No results for input(s): INR, PROTIME in the last 168 hours. Cardiac Enzymes: No results for input(s): CKTOTAL, CKMB, CKMBINDEX, TROPONINI in the last 168 hours. BNP (last 3 results) No results for input(s): PROBNP in  the last 8760 hours. HbA1C: No results for input(s): HGBA1C in the last 72 hours. CBG: No results for input(s): GLUCAP in the last 168 hours. Lipid Profile: No results for input(s): CHOL, HDL, LDLCALC, TRIG, CHOLHDL, LDLDIRECT in the last 72 hours. Thyroid Function Tests: No results for input(s): TSH, T4TOTAL, FREET4, T3FREE, THYROIDAB in the last 72 hours. Anemia Panel: No results for input(s): VITAMINB12, FOLATE, FERRITIN, TIBC, IRON, RETICCTPCT in the last 72 hours. Urine analysis:    Component Value Date/Time   COLORURINE STRAW (A) 08/07/2020 1528   APPEARANCEUR CLEAR (A) 08/07/2020 1528   LABSPEC 1.005 08/07/2020 1528   PHURINE 8.0 08/07/2020 1528   GLUCOSEU NEGATIVE 08/07/2020 1528   HGBUR NEGATIVE 08/07/2020 1528   BILIRUBINUR NEGATIVE 08/07/2020 1528   KETONESUR NEGATIVE 08/07/2020 1528   PROTEINUR NEGATIVE 08/07/2020 1528   NITRITE NEGATIVE 08/07/2020 1528   LEUKOCYTESUR NEGATIVE 08/07/2020 1528   Sepsis Labs: @LABRCNTIP (procalcitonin:4,lacticidven:4)  ) Recent Results (from the past 240 hour(s))  Resp Panel by RT-PCR (Flu A&B, Covid) Nasopharyngeal Swab     Status: None   Collection Time: 05/07/21  3:26 AM   Specimen: Nasopharyngeal Swab; Nasopharyngeal(NP) swabs in vial transport medium  Result Value Ref Range Status   SARS Coronavirus 2 by RT PCR NEGATIVE NEGATIVE Final    Comment: (NOTE) SARS-CoV-2 target nucleic acids are NOT DETECTED.  The SARS-CoV-2 RNA is generally detectable in upper respiratory specimens during the acute phase of infection. The lowest concentration of SARS-CoV-2 viral copies this assay can detect is 138 copies/mL. A negative result does not preclude SARS-Cov-2 infection and should not be used as the sole basis for treatment or other patient management decisions. A negative result may occur with  improper specimen collection/handling, submission of specimen other than nasopharyngeal swab, presence of viral mutation(s) within the areas  targeted by this assay, and inadequate number of viral copies(<138 copies/mL). A negative result must be combined with clinical observations, patient history, and epidemiological information. The expected result is Negative.  Fact Sheet for Patients:  EntrepreneurPulse.com.au  Fact Sheet for Healthcare Providers:  IncredibleEmployment.be  This test is no t yet approved or cleared by the [place] FDA and  has been authorized for detection and/or diagnosis of SARS-CoV-2 by FDA under an Emergency Use Authorization (EUA). This EUA will remain  in effect (meaning this test can be used) for the duration of the COVID-19 declaration under Section 564(b)(1) of the Act, 21 U.S.C.section 360bbb-3(b)(1), unless the authorization is terminated  or revoked sooner.       Influenza A by PCR NEGATIVE NEGATIVE Final   Influenza B by PCR NEGATIVE NEGATIVE Final    Comment: (NOTE) The Xpert Xpress SARS-CoV-2/FLU/RSV plus assay is intended as an aid in the diagnosis of influenza from Nasopharyngeal swab specimens and should not be used as a sole basis for treatment. Nasal washings and aspirates are unacceptable for Xpert Xpress SARS-CoV-2/FLU/RSV testing.  Fact Sheet for Patients: EntrepreneurPulse.com.au  Fact Sheet for Healthcare Providers: IncredibleEmployment.be  This test is not yet approved or cleared by the [place] FDA and has been authorized for detection and/or diagnosis of SARS-CoV-2 by FDA under an Emergency Use Authorization (EUA). This EUA will remain in effect (meaning this test can  be used) for the duration of the COVID-19 declaration under Section 564(b)(1) of the Act, 21 U.S.C. section 360bbb-3(b)(1), unless the authorization is terminated or revoked.  Performed at [facility], [address] [place], [place] [address]          Radiology Studies: CT Head Wo Contrast  Result  Date: 05/07/2021 CLINICAL DATA:  Head trauma, fall EXAM: CT HEAD WITHOUT CONTRAST TECHNIQUE: Contiguous axial images were obtained from the base of the skull through the vertex without intravenous contrast. RADIATION DOSE REDUCTION: This exam was performed according to the departmental dose-optimization program which includes automated exposure control, adjustment of the mA and/or kV according to patient size and/or use of iterative reconstruction technique. COMPARISON:  08/07/2020 FINDINGS: Brain: Intraparenchymal hemorrhage centered in the right basal ganglia. Minimal hypodensity surrounding the basal ganglia portion of the hemorrhage, likely edema. Intraventricular extension into the right-greater-than-left lateral ventricle and third ventricle. Small amount of hemorrhage in the occipital horn of the left lateral ventricle and temporal horn of the right lateral ventricle. Unchanged size and configuration of the ventricles. No significant mass effect or midline shift. No acute infarct or mass. No additional extra-axial collection. Periventricular white matter changes, likely the sequela of chronic small vessel ischemic disease. Vascular: No hyperdense vessel. Skull: Normal. Negative for fracture or focal lesion. Sinuses/Orbits: Bubbly fluid in the right sphenoid sinus. Status post bilateral lens replacements. Other: The mastoids are well aerated. IMPRESSION: 1. Intraparenchymal hemorrhage centered in the right basal ganglia, with intraventricular extension into the right-greater-than-left lateral ventricle and third ventricle, without evidence of hydrocephalus, significant mass effect, or midline shift. 2. Bubbly fluid in the right sphenoid sinus, as can be seen in the setting of sinusitis. Correlate with symptoms. These results were called by telephone at the time of interpretation on 05/07/2021 at 3:07 am to provider JADE SUNG , who verbally acknowledged these results. Electronically Signed   By: Merilyn Baba M.D.    On: 05/07/2021 03:10   DG Chest Port 1 View  Result Date: 05/07/2021 CLINICAL DATA:  Recent fall with chest pain, initial encounter EXAM: PORTABLE CHEST 1 VIEW COMPARISON:  08/07/2020 FINDINGS: Cardiac shadow is enlarged but stable. Aortic calcifications are noted. Distal left clavicular fracture is seen. The lungs are clear bilaterally. No other fractures are noted. IMPRESSION: Distal left clavicular fracture. No acute abnormality is noted in the chest. Electronically Signed   By: Inez Catalina M.D.   On: 05/07/2021 03:20   DG Hip Unilat With Pelvis 2-3 Views Left  Result Date: 05/07/2021 CLINICAL DATA:  Recent fall with left hip pain, initial encounter EXAM: DG HIP (WITH OR WITHOUT PELVIS) 3V LEFT COMPARISON:  None. FINDINGS: Pelvic ring is intact. Healed fractures are noted in the superior and inferior pubic rami on the right. Comminuted left intratrochanteric fracture is noted with impaction and angulation at the fracture site. No dislocation is seen. IMPRESSION: Comminuted left intratrochanteric fracture as described. Old healed fractures are noted involving the pubic rami on the right. Electronically Signed   By: Inez Catalina M.D.   On: 05/07/2021 03:27        Scheduled Meds:  levothyroxine  125 mcg Oral Q0600   Continuous Infusions:  sodium chloride       LOS: 0 days    Time spent: 59 min    Desma Maxim, MD [facility]   If 7PM-7AM, please contact night-coverage [email or web address] Password [email or web address] 05/07/2021, 8:15 AM

## 2021-05-07 NOTE — ED Notes (Signed)
IV attempt x 2 by Leonette Most, RN without success and IV attempt x 2 by E. Minta Balsam, RN without success. Pt tolerated well. MD aware, order for IV consult placed.

## 2021-05-07 NOTE — Assessment & Plan Note (Signed)
Delirium precautions Continue trazodone

## 2021-05-07 NOTE — Progress Notes (Signed)
SLP Cancellation Note  Patient Details Name: CHARMA MOCARSKI MRN: [id number] DOB: 23-Feb-1927   Cancelled treatment:       Reason Eval/Treat Not Completed: Patient at procedure or test/unavailable;Patient not medically ready (chart reviewed; consulted NSG) Pt is scheduled for surgery late this afternoon d/t hip fx s/p fall(reason for admit). She is NPO. ST services will f/u when pt is medically appropriate. NSG updated.     Orinda Kenner, MS, CCC-SLP Speech Language Pathologist Rehab Services; [place] [phone] (ascom) Jenin Birdsall 05/07/2021, 10:35 AM

## 2021-05-07 NOTE — Progress Notes (Signed)
Patient is a DNR, but the family is ok and agrees for her to receive blood during surgery if needed.

## 2021-05-07 NOTE — OR Nursing (Signed)
Patient arrived to room 12 SDS from the ED. OR charge sent for this patient without [place] having obtained any report. Son is at bedside and assisting with questions. He is her POA. Patient is oriented to person and place but that is it. She is calm and cooperative. Patient with questionable brain bleed. Dr. Lenna Sciara. [place] called to evaluate and he reports patient is okay and it is okay to proceed with surgery. He has been in to evaluate patient. Left hip swollen, left foot warm with faint pulses.Patient does not appear to be in any apparent distressed when not moved.

## 2021-05-07 NOTE — Progress Notes (Addendum)
° °   Attending Progress Note  History: Michele Lambert is here for IPH with IVH and L femur fracture.   HD1: She is post-op from L femur ORIF. She has no complaints.  Physical Exam: Vitals:   05/07/21 1720 05/07/21 1720  BP:  118/72  Pulse: 92 95  Resp: 18 19  Temp:  98.2 F (36.8 C)  SpO2: 98%     AA Oxself CNI  MAE  Data:  Recent Labs  Lab 05/07/21 0941  NA 135  K 3.9  CL 99  CO2 28  BUN 29*  CREATININE 0.97  GLUCOSE 118*  CALCIUM 8.8*   Recent Labs  Lab 05/07/21 0941  AST 29  ALT 15  ALKPHOS 53     Recent Labs  Lab 05/07/21 0941  WBC 17.3*  HGB 11.1*  HCT 34.5*  PLT 246   No results for input(s): APTT, INR in the last 168 hours.       Other tests/results: HCT postop- stable overall  Assessment/Plan:  Michele Lambert is stable after ORIF of L femur, with R putamen IPH with IVH  - no anticoagulation - will continue to monitor - ok to mobilize with PT pending Dr. Harden Mo recommendation   Meade Maw MD, [facility] Department of Neurosurgery  10 minutes spent in total evaluation.

## 2021-05-07 NOTE — Anesthesia Preprocedure Evaluation (Addendum)
Anesthesia Evaluation  Patient identified by MRN, date of birth, ID band Patient confused    Reviewed: Allergy & Precautions, NPO status , Patient's Chart, lab work & pertinent test results  History of Anesthesia Complications Negative for: history of anesthetic complications  Airway Mallampati: III  TM Distance: <3 FB Neck ROM: full    Dental  (+) Chipped, Missing, Poor Dentition, Loose   Pulmonary neg shortness of breath,    Pulmonary exam normal        Cardiovascular hypertension, (-) anginaNormal cardiovascular exam     Neuro/Psych PSYCHIATRIC DISORDERS negative neurological ROS  negative psych ROS   GI/Hepatic negative GI ROS, Neg liver ROS,   Endo/Other  Hypothyroidism   Renal/GU Renal disease     Musculoskeletal   Abdominal   Peds  Hematology negative hematology ROS (+)   Anesthesia Other Findings Patient has neurosurgery clearance for this procedure for her intracranial bleed  Past Medical History: No date: Dementia ([place]) No date: Hypothyroidism  Past Surgical History: 05/02/2018: ORIF HUMERUS FRACTURE; Right     Comment:  Procedure: OPEN REDUCTION INTERNAL FIXATION (ORIF)               DISTAL HUMERUS FRACTURE;  Surgeon: Hiram Gash, MD;                Location: [facility];  Service: Orthopedics;  Laterality:               Right;  BMI    Body Mass Index: 29.45 kg/m      Reproductive/Obstetrics negative OB ROS                            Anesthesia Physical Anesthesia Plan  ASA: 3  Anesthesia Plan: General ETT   Post-op Pain Management:    Induction: Intravenous  PONV Risk Score and Plan: Ondansetron, Dexamethasone, Midazolam and Treatment may vary due to age or medical condition  Airway Management Planned: Oral ETT  Additional Equipment:   Intra-op Plan:   Post-operative Plan: Extubation in OR  Informed Consent: I have reviewed the patients History and  Physical, chart, labs and discussed the procedure including the risks, benefits and alternatives for the proposed anesthesia with the patient or authorized representative who has indicated his/her understanding and acceptance.   Patient has DNR.  Suspend DNR and Discussed DNR with power of attorney.   Dental Advisory Given  Plan Discussed with: Anesthesiologist, CRNA and Surgeon  Anesthesia Plan Comments: (History and phone consent from the patients son Lynnda Wiersma at [phone]   Son consented for risks of anesthesia including but not limited to:  - adverse reactions to medications - damage to eyes, teeth, lips or other oral mucosa - nerve damage due to positioning  - sore throat or hoarseness - Damage to heart, brain, nerves, lungs, other parts of body or loss of life  He voiced understanding.)        Anesthesia Quick Evaluation

## 2021-05-07 NOTE — Assessment & Plan Note (Addendum)
For orthopedic repair once stable CTs from intraparenchymal hemorrhage Pain control Further orders per orthopedics

## 2021-05-07 NOTE — H&P (Signed)
History and Physical    Patient: Michele Lambert:[id number] DOB: 17-Jan-1927 [name]: 05/07/2021 DOS: the patient was seen and examined on 05/07/2021 PCP: Dion Body, MD  Patient coming from: ALF/ILF  Chief Complaint:  Chief Complaint  Patient presents with   Fall    HPI: Michele Lambert is a 88 y.o. female with medical history significant of Dementia, HTN, hypothyroidism, who presents to the ED from assisted living facility following a witnessed fall.  Staff told EMS that he had a thud in the patient's room and they found her on the floor.  She complains of left hip pain.  She was previously in her usual state of health.  She denies chest pain, shortness of breath, headache or neck pain, nausea or vomiting  ED course: On arrival BP 170/131 with otherwise normal vitals Blood work pending  Imaging: CT head with intraparenchymal hemorrhage in the right basal ganglia without evidence of hydrocephalus, significant mass effect or midline shift Hip x-ray with comminuted left intertrochanteric fracture Chest x-ray no acute abnormality  The ED provider spoke with neurosurgery who advised to repeat CT head in 6 hours to assess for stability of bleed ED provider spoke with patient's son who is a doctor and her POA who declined any neurosurgical intervention for worsening bleed but was agreeable to intervention for hip repair to maintain functional status Ortho was consulted from the ED and will plan operative repair pending confirm stability of brain bleed  Hospitalist consulted for admission.   Review of Systems: unable to review all systems due to the inability of the patient to answer questions. Due to dementia Past Medical History:  Diagnosis Date   Dementia ([place])    Hypothyroidism    Past Surgical History:  Procedure Laterality Date   ORIF HUMERUS FRACTURE Right 05/02/2018   Procedure: OPEN REDUCTION INTERNAL FIXATION (ORIF) DISTAL HUMERUS FRACTURE;  Surgeon: Hiram Gash, MD;   Location: [facility] [facility];  Service: Orthopedics;  Laterality: Right;   Social History:  reports that she has never smoked. She has never used smokeless tobacco. She reports that she does not currently use alcohol. She reports that she does not use drugs.  Allergies  Allergen Reactions   Codeine    Lescol [Fluvastatin Sodium]     Family History  Problem Relation Age of Onset   Stroke Mother     Prior to Admission medications   Medication Sig Start Date End Date Taking? Authorizing Provider  Calcium Carbonate (CALCIUM 600 PO) Take 1,200 mg by mouth daily.    [provider]  levothyroxine (SYNTHROID, LEVOTHROID) 88 MCG tablet Take 88 mcg by mouth daily before breakfast.    [provider]  polyethylene glycol (MIRALAX / GLYCOLAX) packet Take 17 g by mouth daily. 05/06/18   Elodia Florence., MD  traZODone (DESYREL) 50 MG tablet Take 1 tablet (50 mg total) by mouth at bedtime as needed for up to 30 days for sleep. 05/06/18 06/05/18  Elodia Florence., MD    Physical Exam: Vitals:   05/07/21 0224 05/07/21 0229 05/07/21 0230 05/07/21 0400  BP:  (!) 170/131  (!) 173/93  Pulse:  80  84  Resp:  17  20  Temp:  (!) 97.4 F (36.3 C)    TempSrc:  Oral    SpO2: 97% 99%  97%  Weight:   68.4 kg   Height:   5' (1.524 m)    Physical Exam Constitutional:      General: She is  sleeping.     Appearance: Normal appearance.  HENT:     Head: Normocephalic and atraumatic.  Cardiovascular:     Rate and Rhythm: Normal rate and regular rhythm.     Pulses: Normal pulses.  Pulmonary:     Effort: Pulmonary effort is normal.     Breath sounds: Normal breath sounds.  Abdominal:     General: Bowel sounds are normal.     Palpations: Abdomen is soft.     Tenderness: There is no abdominal tenderness.  Musculoskeletal:     Right hip: Normal.     Left hip: Tenderness present.  Skin:    Findings: No bruising.  Neurological:     Mental Status: She is easily aroused. Mental status is at  baseline.     Data Reviewed:  Vitals, labs, images, EKG Outpatient PCP notes ED provider and triage notes As above  Assessment and Plan: Closed intertrochanteric fracture of left hip, initial encounter [facility]) For orthopedic repair once stable CTs from intraparenchymal hemorrhage Pain control Further orders per orthopedics  Traumatic intraparenchymal hemorrhage Neurosurgery consulted from the ED with recommendation to repeat CT head in 6 hours ED provider spoke with son who will not want intervention even if CT head shows worsening  Dementia ([place])- (present on admission) Delirium precautions Continue trazodone  Hypothyroidism- (present on admission) Continue levothyroxine    Advance Care Planning:   Code Status: DNR   Consults: ortho  Family Communication: eldest son at bedside  Severity of Illness: The appropriate patient status for this patient is INPATIENT. Inpatient status is judged to be reasonable and necessary in order to provide the required intensity of service to ensure the patient's safety. The patient's presenting symptoms, physical exam findings, and initial radiographic and laboratory data in the context of their chronic comorbidities is felt to place them at high risk for further clinical deterioration. Furthermore, it is not anticipated that the patient will be medically stable for discharge from the hospital within 2 midnights of admission.   * I certify that at the point of admission it is my clinical judgment that the patient will require inpatient hospital care spanning beyond 2 midnights from the point of admission due to high intensity of service, high risk for further deterioration and high frequency of surveillance required.*  Author: Athena Masse, MD 05/07/2021 4:26 AM  For on call review [email or web address].

## 2021-05-07 NOTE — Progress Notes (Signed)
Full repeat note to follow:  Post op Head CT is improved in my view.  There is layering of the IVH.    Would recommend readmission to her room from pre-op.  She is stable for floor status.

## 2021-05-07 NOTE — Consult Note (Signed)
ORTHOPAEDIC CONSULTATION  REQUESTING PHYSICIAN: Wouk, Ailene Rud, MD  Chief Complaint: Left hip pain status post fall  HPI: Michele Lambert is a 88 y.o. female who was admitted overnight for left hip pain after a fall.  I saw the patient and her son in the ER at approximately 8:45 this morning.  Patient was going for a repeat head CT after the initial scan showed some bleeding in the right basal ganglia with extension into the right greater than left lateral ventricle.  Neurosurgery has been consulted.  Patient complained of left hip pain, but no other injuries.  Patient has a previous history of an ORIF of a right humerus fracture performed in [place] in 2020.  Past Medical History:  Diagnosis Date   Dementia ([place])    Hypothyroidism    Past Surgical History:  Procedure Laterality Date   ORIF HUMERUS FRACTURE Right 05/02/2018   Procedure: OPEN REDUCTION INTERNAL FIXATION (ORIF) DISTAL HUMERUS FRACTURE;  Surgeon: Hiram Gash, MD;  Location: [facility];  Service: Orthopedics;  Laterality: Right;   Social History   Socioeconomic History   Marital status: Widowed    Spouse name: Not on file   Number of children: Not on file   Years of education: Not on file   Highest education level: Not on file  Occupational History   Not on file  Tobacco Use   Smoking status: Never   Smokeless tobacco: Never  Substance and Sexual Activity   Alcohol use: Not Currently   Drug use: Never   Sexual activity: Not Currently  Other Topics Concern   Not on file  Social History Narrative   Not on file   Social Determinants of Health   Financial Resource Strain: Not on file  Food Insecurity: Not on file  Transportation Needs: Not on file  Physical Activity: Not on file  Stress: Not on file  Social Connections: Not on file   Family History  Problem Relation Age of Onset   Stroke Mother    Allergies  Allergen Reactions   Codeine    Lescol [Fluvastatin Sodium]    Prior to Admission  medications   Medication Sig Start Date End Date Taking? Authorizing Provider  acetaminophen (TYLENOL) 650 MG CR tablet Take 650 mg by mouth every 8 (eight) hours as needed. 12/24/14  Yes [provider]  Calcium Carb-Cholecalciferol 600-10 MG-MCG TABS Take 1 tablet by mouth 2 (two) times daily.   Yes [provider]  hydrochlorothiazide (HYDRODIURIL) 25 MG tablet Take 25 mg by mouth daily. 04/20/21  Yes [provider]  levothyroxine (SYNTHROID) 125 MCG tablet Take 125 mcg by mouth every morning. 04/20/21  Yes [provider]  losartan (COZAAR) 50 MG tablet Take 50 mg by mouth every evening. 05/02/21  Yes [provider]  polyethylene glycol (MIRALAX / GLYCOLAX) packet Take 17 g by mouth daily. Patient taking differently: Take 17 g by mouth daily as needed. 05/06/18  Yes Elodia Florence., MD  Calcium Carbonate (CALCIUM 600 PO) Take 1,200 mg by mouth daily. Patient not taking: Reported on 05/07/2021    [provider]  levothyroxine (SYNTHROID, LEVOTHROID) 88 MCG tablet Take 88 mcg by mouth daily before breakfast. Patient not taking: Reported on 05/07/2021    [provider]  traZODone (DESYREL) 50 MG tablet Take 1 tablet (50 mg total) by mouth at bedtime as needed for up to 30 days for sleep. 05/06/18 06/05/18  Elodia Florence., MD   CT [facility]  Contrast  Result Date: 05/07/2021 CLINICAL DATA:  Fall, intracranial hemorrhage EXAM: CT HEAD WITHOUT CONTRAST TECHNIQUE: Contiguous axial images were obtained from the base of the skull through the vertex without intravenous contrast. RADIATION DOSE REDUCTION: This exam was performed according to the departmental dose-optimization program which includes automated exposure control, adjustment of the mA and/or kV according to patient size and/or use of iterative reconstruction technique. COMPARISON:  CT head obtained earlier the same day FINDINGS: Brain: Again seen is small volume intraparenchymal  hemorrhage centered in the right thalamus and anterior limb of the internal capsule. There is no significant regional mass effect. Intraventricular blood in the bodies of the lateral ventricles, third ventricle, and layering in the occipital horns is again seen. Blood in the occipital horns appears slightly increased from the prior study, though this may reflect redistribution as previously seen blood in the right temporal horn is no longer seen. Blood elsewhere appears unchanged. The ventricular system is unchanged in size and configuration, with no evidence of developing hydrocephalus. Background parenchymal volume is unchanged. Patchy hypodensity in the subcortical and periventricular white matter likely reflecting sequela of chronic white matter microangiopathy is unchanged. There is no evidence of infarct. Vascular: There is calcification of the bilateral cavernous ICAs. Skull: Normal. Negative for fracture or focal lesion. Sinuses/Orbits: The imaged paranasal sinuses are clear. Bilateral lens implants are in place. The globes and orbits are otherwise unremarkable. Other: None. IMPRESSION: 1. Intraventricular blood layering in the occipital horns appears slightly increased since the prior study, though this may reflect redistribution. Additional intraventricular and intraparenchymal blood products are not significantly changed. 2. Stable size and configuration of the ventricular system. Electronically Signed   By: Valetta Mole M.D.   On: 05/07/2021 09:24   CT Head Wo Contrast  Result Date: 05/07/2021 CLINICAL DATA:  Head trauma, fall EXAM: CT HEAD WITHOUT CONTRAST TECHNIQUE: Contiguous axial images were obtained from the base of the skull through the vertex without intravenous contrast. RADIATION DOSE REDUCTION: This exam was performed according to the departmental dose-optimization program which includes automated exposure control, adjustment of the mA and/or kV according to patient size and/or use of  iterative reconstruction technique. COMPARISON:  08/07/2020 FINDINGS: Brain: Intraparenchymal hemorrhage centered in the right basal ganglia. Minimal hypodensity surrounding the basal ganglia portion of the hemorrhage, likely edema. Intraventricular extension into the right-greater-than-left lateral ventricle and third ventricle. Small amount of hemorrhage in the occipital horn of the left lateral ventricle and temporal horn of the right lateral ventricle. Unchanged size and configuration of the ventricles. No significant mass effect or midline shift. No acute infarct or mass. No additional extra-axial collection. Periventricular white matter changes, likely the sequela of chronic small vessel ischemic disease. Vascular: No hyperdense vessel. Skull: Normal. Negative for fracture or focal lesion. Sinuses/Orbits: Bubbly fluid in the right sphenoid sinus. Status post bilateral lens replacements. Other: The mastoids are well aerated. IMPRESSION: 1. Intraparenchymal hemorrhage centered in the right basal ganglia, with intraventricular extension into the right-greater-than-left lateral ventricle and third ventricle, without evidence of hydrocephalus, significant mass effect, or midline shift. 2. Bubbly fluid in the right sphenoid sinus, as can be seen in the setting of sinusitis. Correlate with symptoms. These results were called by telephone at the time of interpretation on 05/07/2021 at 3:07 am to provider JADE SUNG , who verbally acknowledged these results. Electronically Signed   By: Merilyn Baba M.D.   On: 05/07/2021 03:10   DG Chest Port 1 View  Result Date: 05/07/2021 CLINICAL DATA:  Recent fall with chest pain, initial encounter EXAM: PORTABLE CHEST 1 VIEW COMPARISON:  08/07/2020 FINDINGS: Cardiac shadow is enlarged but stable. Aortic calcifications are noted. Distal left clavicular fracture is seen. The lungs are clear bilaterally. No other fractures are noted. IMPRESSION: Distal left clavicular fracture. No  acute abnormality is noted in the chest. Electronically Signed   By: Inez Catalina M.D.   On: 05/07/2021 03:20   DG Hip Unilat With Pelvis 2-3 Views Left  Result Date: 05/07/2021 CLINICAL DATA:  Recent fall with left hip pain, initial encounter EXAM: DG HIP (WITH OR WITHOUT PELVIS) 3V LEFT COMPARISON:  None. FINDINGS: Pelvic ring is intact. Healed fractures are noted in the superior and inferior pubic rami on the right. Comminuted left intratrochanteric fracture is noted with impaction and angulation at the fracture site. No dislocation is seen. IMPRESSION: Comminuted left intratrochanteric fracture as described. Old healed fractures are noted involving the pubic rami on the right. Electronically Signed   By: Inez Catalina M.D.   On: 05/07/2021 03:27    Positive ROS: All other systems have been reviewed and were otherwise negative with the exception of those mentioned in the HPI and as above.  Physical Exam: General: Alert, no acute distress, complains of left hip pain  MUSCULOSKELETAL: Left lower: Patient's skin is intact.  There is shortening and external rotation of the left lower extremity.  She has palpable pedal pulses.  Her foot is well-perfused.  She has intact sensation light touch gently flex and extend her toes and dorsiflex and plantarflex her ankle.  Assessment: Left comminuted displaced intertrochanteric hip fracture  Plan: I reviewed the patient's x-ray films showing a left displaced intertrochanteric hip fracture.  I explained the findings to the patient and her son.  I have recommended intramedullary fixation for this fracture.  I reviewed the details of the operation as well as the postoperative course with them.  I discussed the risks and benefits of surgery. The risks include but are not limited to infection, bleeding requiring blood transfusion, nerve or blood vessel injury, joint stiffness or loss of motion, persistent pain, weakness or instability, malunion, nonunion and  hardware failure and the need for further surgery. Medical risks include but are not limited to DVT and pulmonary embolism, myocardial infarction, stroke, pneumonia, respiratory failure and death. Patient's son understood these risks and wished to proceed.   Dr. Cari Caraway from neurosurgery has seen the patient and reviewed the second CT scan.  He has cleared the patient for surgery and is recommended that the anesthesia keep her systolic blood pressure less than 160 during the case.    [place], MD    05/07/2021 10:10 AM

## 2021-05-07 NOTE — ED Triage Notes (Addendum)
Patient arrived via EMS from [place] after an unwitnessed fall this am. Pt reports left hip pain. Unknown if there was LOC. Pt takes no blood thinners. Shortening and rotation observed to left leg. Pt has baseline dementia.

## 2021-05-07 NOTE — ED Provider Notes (Signed)
[facility] Provider Note    Event Date/Time   First MD Initiated Contact with Patient 05/07/21 [phone]     (approximate)   History   Fall   HPI  Level V caveat: Limited by dementia  Michele Lambert is a 88 y.o. female brought to the ED via EMS from home place in [place] status post unwitnessed fall.  Staff told EMS they heard a thud from patient's room and she was found on the floor.  Denies striking head or LOC.  Does not take anticoagulants.  Complains of left hip pain.  Denies chest pain, shortness of breath, neck pain, headache, abdominal pain, nausea, vomiting or dizziness     Past Medical History   Past Medical History:  Diagnosis Date   Dementia [facility])    Hypothyroidism      Active Problem List   Patient Active Problem List   Diagnosis Date Noted   Traumatic intraparenchymal hemorrhage 05/07/2021   Closed intertrochanteric fracture of left hip, initial encounter ([place]) 05/07/2021   Fracture of distal humerus 05/03/2018   Displaced fracture of right humerus 05/01/2018   Hypothyroidism 05/01/2018   Dementia ([place]) 05/01/2018     Past Surgical History   Past Surgical History:  Procedure Laterality Date   ORIF HUMERUS FRACTURE Right 05/02/2018   Procedure: OPEN REDUCTION INTERNAL FIXATION (ORIF) DISTAL HUMERUS FRACTURE;  Surgeon: Hiram Gash, MD;  Location: [place];  Service: Orthopedics;  Laterality: Right;     Home Medications   Prior to Admission medications   Medication Sig Start Date End Date Taking? Authorizing Provider  acetaminophen (TYLENOL) 650 MG CR tablet Take 650 mg by mouth every 8 (eight) hours as needed. 12/24/14  Yes [provider]  Calcium Carb-Cholecalciferol 600-10 MG-MCG TABS Take 1 tablet by mouth 2 (two) times daily.   Yes [provider]  hydrochlorothiazide (HYDRODIURIL) 25 MG tablet Take 25 mg by mouth daily. 04/20/21  Yes [provider]  levothyroxine (SYNTHROID) 125 MCG tablet Take  125 mcg by mouth every morning. 04/20/21  Yes [provider]  losartan (COZAAR) 50 MG tablet Take 50 mg by mouth every evening. 05/02/21  Yes [provider]  polyethylene glycol (MIRALAX / GLYCOLAX) packet Take 17 g by mouth daily. Patient taking differently: Take 17 g by mouth daily as needed. 05/06/18  Yes Elodia Florence., MD  Calcium Carbonate (CALCIUM 600 PO) Take 1,200 mg by mouth daily. Patient not taking: Reported on 05/07/2021    [provider]  levothyroxine (SYNTHROID, LEVOTHROID) 88 MCG tablet Take 88 mcg by mouth daily before breakfast. Patient not taking: Reported on 05/07/2021    [provider]  traZODone (DESYREL) 50 MG tablet Take 1 tablet (50 mg total) by mouth at bedtime as needed for up to 30 days for sleep. 05/06/18 06/05/18  Elodia Florence., MD     Allergies  Codeine and Lescol [fluvastatin sodium]   Family History   Family History  Problem Relation Age of Onset   Stroke Mother      Physical Exam  Triage Vital Signs: ED Triage Vitals  Enc Vitals Group     BP 05/07/21 0229 (!) 170/131     Pulse Rate 05/07/21 0229 80     Resp 05/07/21 0229 17     Temp 05/07/21 0229 (!) 97.4 F (36.3 C)     Temp Source 05/07/21 0229 Oral     SpO2 05/07/21 0224 97 %  Weight 05/07/21 0230 150 lb 12.8 oz (68.4 kg)     Height 05/07/21 0230 5' (1.524 m)     Head Circumference --      Peak Flow --      Pain Score --      Pain Loc --      Pain Edu? --      Excl. in [place]? --     Updated Vital Signs: BP 139/70    Pulse 79    Temp (!) 97.4 F (36.3 C) (Oral)    Resp 20    Ht 5' (1.524 m)    Wt 68.4 kg    SpO2 99%    BMI 29.45 kg/m    General: Awake, mild distress.  CV:  RRR.  Good peripheral perfusion.  Resp:  Normal effort.  CTAB. Abd:  Nontender no distention.  Other:  No scalp hematoma.  Cervical spine nontender to palpation.  Pelvis is stable.  Left lower leg shortened with external rotation.  Left hip tender to palpation.   1+ BLE pitting edema.   ED Results / Procedures / Treatments  Labs (all labs ordered are listed, but only abnormal results are displayed) Labs Reviewed  RESP PANEL BY RT-PCR (FLU A&B, COVID) [id number]  CBC WITH DIFFERENTIAL/PLATELET  COMPREHENSIVE METABOLIC PANEL  TROPONIN I (HIGH SENSITIVITY)  TROPONIN I (HIGH SENSITIVITY)     EKG  ED ECG REPORT I, Gresia Isidoro J, the attending physician, personally viewed and interpreted this ECG.   Date: 05/07/2021  EKG Time: 0249  Rate: 79  Rhythm: normal sinus rhythm  Axis: Normal  Intervals:none  ST&T Change: Nonspecific    RADIOLOGY I have personally reviewed patient's CT head, chest x-ray and left hip x-rays as well as the radiology interpretation:  CT head: Discussed with radiologist -right basal ganglia bleed  Chest x-ray: Distal left clavicular fracture, no acute cardiopulmonary process  Left hip x-rays: Comminuted left intertrochanteric fracture  Official radiology report(s): CT Head Wo Contrast  Result Date: 05/07/2021 CLINICAL DATA:  Head trauma, fall EXAM: CT HEAD WITHOUT CONTRAST TECHNIQUE: Contiguous axial images were obtained from the base of the skull through the vertex without intravenous contrast. RADIATION DOSE REDUCTION: This exam was performed according to the departmental dose-optimization program which includes automated exposure control, adjustment of the mA and/or kV according to patient size and/or use of iterative reconstruction technique. COMPARISON:  08/07/2020 FINDINGS: Brain: Intraparenchymal hemorrhage centered in the right basal ganglia. Minimal hypodensity surrounding the basal ganglia portion of the hemorrhage, likely edema. Intraventricular extension into the right-greater-than-left lateral ventricle and third ventricle. Small amount of hemorrhage in the occipital horn of the left lateral ventricle and temporal horn of the right lateral ventricle. Unchanged size and configuration of the ventricles. No  significant mass effect or midline shift. No acute infarct or mass. No additional extra-axial collection. Periventricular white matter changes, likely the sequela of chronic small vessel ischemic disease. Vascular: No hyperdense vessel. Skull: Normal. Negative for fracture or focal lesion. Sinuses/Orbits: Bubbly fluid in the right sphenoid sinus. Status post bilateral lens replacements. Other: The mastoids are well aerated. IMPRESSION: 1. Intraparenchymal hemorrhage centered in the right basal ganglia, with intraventricular extension into the right-greater-than-left lateral ventricle and third ventricle, without evidence of hydrocephalus, significant mass effect, or midline shift. 2. Bubbly fluid in the right sphenoid sinus, as can be seen in the setting of sinusitis. Correlate with symptoms. These results were called by telephone at the time of interpretation on 05/07/2021 at 3:07 am to provider [place]  Nels Munn , who verbally acknowledged these results. Electronically Signed   By: Merilyn Baba M.D.   On: 05/07/2021 03:10   DG Chest Port 1 View  Result Date: 05/07/2021 CLINICAL DATA:  Recent fall with chest pain, initial encounter EXAM: PORTABLE CHEST 1 VIEW COMPARISON:  08/07/2020 FINDINGS: Cardiac shadow is enlarged but stable. Aortic calcifications are noted. Distal left clavicular fracture is seen. The lungs are clear bilaterally. No other fractures are noted. IMPRESSION: Distal left clavicular fracture. No acute abnormality is noted in the chest. Electronically Signed   By: Inez Catalina M.D.   On: 05/07/2021 03:20   DG Hip Unilat With Pelvis 2-3 Views Left  Result Date: 05/07/2021 CLINICAL DATA:  Recent fall with left hip pain, initial encounter EXAM: DG HIP (WITH OR WITHOUT PELVIS) 3V LEFT COMPARISON:  None. FINDINGS: Pelvic ring is intact. Healed fractures are noted in the superior and inferior pubic rami on the right. Comminuted left intratrochanteric fracture is noted with impaction and angulation at the  fracture site. No dislocation is seen. IMPRESSION: Comminuted left intratrochanteric fracture as described. Old healed fractures are noted involving the pubic rami on the right. Electronically Signed   By: Inez Catalina M.D.   On: 05/07/2021 03:27     PROCEDURES:  Critical Care performed: Yes, see critical care procedure note(s)  CRITICAL CARE Performed by: Paulette Blanch   Total critical care time: 45 minutes  Critical care time was exclusive of separately billable procedures and treating other patients.  Critical care was necessary to treat or prevent imminent or life-threatening deterioration.  Critical care was time spent personally by me on the following activities: development of treatment plan with patient and/or surrogate as well as nursing, discussions with consultants, evaluation of patient's response to treatment, examination of patient, obtaining history from patient or surrogate, ordering and performing treatments and interventions, ordering and review of laboratory studies, ordering and review of radiographic studies, pulse oximetry and re-evaluation of patient's condition.   [name] Kitchen1-3 Lead EKG Interpretation Performed by: Paulette Blanch, MD Authorized by: Paulette Blanch, MD     Interpretation: normal     ECG rate:  79   ECG rate assessment: normal     Rhythm: sinus rhythm     Ectopy: none     Conduction: normal   Comments:     Patient placed on cardiac monitor to evaluate for arrhythmias   MEDICATIONS ORDERED IN ED: Medications  HYDROcodone-acetaminophen (NORCO/VICODIN) 5-325 MG per tablet 1-2 tablet (has no administration in time range)  morphine (PF) 2 MG/ML injection 0.5 mg (has no administration in time range)  0.9 %  sodium chloride infusion (has no administration in time range)  levothyroxine (SYNTHROID) tablet 125 mcg (has no administration in time range)  fentaNYL (SUBLIMAZE) injection 25 mcg (25 mcg Intravenous Given 05/07/21 0503)     IMPRESSION / MDM /  ASSESSMENT AND PLAN / ED COURSE  I reviewed the triage vital signs and the nursing notes.                             88 year old female who presents status post fall with left hip injury.  Differential diagnosis includes but is not limited to fracture, dislocation, musculoskeletal contusion.  I have personally reviewed patient's records and note a PCP office visit from 04/20/2021 for chronic medical issues including lower extremity edema, hypothyroidism and hypertension.  The patient is on the cardiac monitor to evaluate for evidence of  arrhythmia and/or significant heart rate changes.  We will obtain plain film x-rays.  Given left leg shortening and rotation, suspicion is high for hip fracture; therefore will obtain lab work, chest x-ray and respiratory panel in anticipation of hospitalization.  Clinical Course as of 05/07/21 0716  Fri May 07, 2021  9702 Discussed CT head results with radiologist.  Patient's son Telford Nab is currently at bedside.  Updated patient and her son on CT head demonstrating right basal ganglia intraparenchymal hemorrhage.  He will touch base with his brother who is a pediatric oncologist and is more familiar with patient's wishes as far as whether or not she would desire surgery and if she has a living will. [JS]  0402 Had a conversation with both of patient's sons, including the pediatric oncologist who confirms patient is DNR.  Would not desire neurosurgery should intraparenchymal bleed expands.  But they would desire repair for her hip fracture for comfort measures as she is still ambulatory. [JS]  0403 Discussed case with Dr. Cari Caraway from neurosurgery who recommends repeating CT head in 6 hours.  He will also see her while she is in the hospital.  Notify Dr. Mack Guise from orthopedics of patient's left IT fracture.  Will consult hospitalist services for admission. [JS]    Clinical Course User Index [JS] Paulette Blanch, MD     FINAL CLINICAL IMPRESSION(S) / ED  DIAGNOSES   Final diagnoses:  Fall, initial encounter  Basal ganglia hemorrhage ([place])  Closed nondisplaced fracture of left clavicle, unspecified part of clavicle, initial encounter  Closed fracture of left hip, initial encounter [facility])     Rx / DC Orders   ED Discharge Orders     None        Note:  This document was prepared using Dragon voice recognition software and may include unintentional dictation errors.   Paulette Blanch, MD 05/07/21 [phone]

## 2021-05-07 NOTE — Assessment & Plan Note (Signed)
Continue levothyroxine 

## 2021-05-07 NOTE — Consult Note (Addendum)
Referring Physician:  No referring provider defined for this encounter.  Primary Physician:  Michele Body, MD  Chief Complaint:  IPH/IVH  History of Present Illness: 05/07/2021 Michele Lambert is a 88 y.o. female who presents with the chief complaint of fall with a broken hip.   She is in memory care at baseline and is unable to give an adequate history - Level 5 consult qualifier.  She was found down at her facility and brought in for evaluation. She denies HA, N, V.  She cannot give a history.  Her son is present and reports that she is at her baseline.    Review of Systems:  A 10 point review of systems is negative, except for the pertinent positives and negatives detailed in the HPI.  Past Medical History: Past Medical History:  Diagnosis Date   Dementia ([place])    Hypothyroidism     Past Surgical History: Past Surgical History:  Procedure Laterality Date   ORIF HUMERUS FRACTURE Right 05/02/2018   Procedure: OPEN REDUCTION INTERNAL FIXATION (ORIF) DISTAL HUMERUS FRACTURE;  Surgeon: Hiram Gash, MD;  Location: [place];  Service: Orthopedics;  Laterality: Right;    Allergies: Allergies as of 05/07/2021 - Review Complete 05/07/2021  Allergen Reaction Noted   Codeine  11/21/2017   Lescol [fluvastatin sodium]  11/21/2017    Medications:  Current Facility-Administered Medications:    0.9 %  sodium chloride infusion, , Intravenous, Continuous, Athena Masse, MD   HYDROcodone-acetaminophen (NORCO/VICODIN) 5-325 MG per tablet 1-2 tablet, 1-2 tablet, Oral, Q6H PRN, Athena Masse, MD   [START ON 05/08/2021] levothyroxine (SYNTHROID) tablet 125 mcg, 125 mcg, Oral, QAC breakfast, Wouk, Ailene Rud, MD   losartan (COZAAR) tablet 50 mg, 50 mg, Oral, QPM, Wouk, Ailene Rud, MD   morphine (PF) 2 MG/ML injection 0.5 mg, 0.5 mg, Intravenous, Q2H PRN, Athena Masse, MD  Current Outpatient Medications:    acetaminophen (TYLENOL) 650 MG CR tablet, Take 650 mg by mouth every 8  (eight) hours as needed., Disp: , Rfl:    Calcium Carb-Cholecalciferol 600-10 MG-MCG TABS, Take 1 tablet by mouth 2 (two) times daily., Disp: , Rfl:    hydrochlorothiazide (HYDRODIURIL) 25 MG tablet, Take 25 mg by mouth daily., Disp: , Rfl:    levothyroxine (SYNTHROID) 125 MCG tablet, Take 125 mcg by mouth every morning., Disp: , Rfl:    losartan (COZAAR) 50 MG tablet, Take 50 mg by mouth every evening., Disp: , Rfl:    polyethylene glycol (MIRALAX / GLYCOLAX) packet, Take 17 g by mouth daily. (Patient taking differently: Take 17 g by mouth daily as needed.), Disp: 14 each, Rfl: 0   Calcium Carbonate (CALCIUM 600 PO), Take 1,200 mg by mouth daily. (Patient not taking: Reported on 05/07/2021), Disp: , Rfl:    levothyroxine (SYNTHROID, LEVOTHROID) 88 MCG tablet, Take 88 mcg by mouth daily before breakfast. (Patient not taking: Reported on 05/07/2021), Disp: , Rfl:    traZODone (DESYREL) 50 MG tablet, Take 1 tablet (50 mg total) by mouth at bedtime as needed for up to 30 days for sleep., Disp: 30 tablet, Rfl: 0   Social History: Social History   Tobacco Use   Smoking status: Never   Smokeless tobacco: Never  Substance Use Topics   Alcohol use: Not Currently   Drug use: Never    Family Medical History: Family History  Problem Relation Age of Onset   Stroke Mother     Physical Examination: Vitals:   05/07/21 0630 05/07/21  0700  BP: (!) 151/73 139/70  Pulse: 79 79  Resp: (!) 21 20  Temp:    SpO2: 100% 99%     General: Patient is well developed, well nourished, calm, collected, and in no apparent distress.  Psychiatric: Patient is non-anxious.  Head:  Pupils equal, round, and reactive to light.  ENT:  Oral mucosa appears well hydrated.  Neck:   Supple.  Full range of motion.  Respiratory: Patient is breathing without any difficulty.  Extremities: No edema.  Vascular: Palpable pulses in dorsal pedal vessels.  Skin:   On exposed skin, there are no abnormal skin  lesions.  NEUROLOGICAL:  General: In no acute distress.   Awake, alert, oriented to person, "2004."  Pupils equal round and reactive to light.  Facial tone is symmetric.  Tongue protrusion is midline.  There is no pronator drift.   Strength: Side Biceps Triceps Deltoid Interossei Grip Wrist Ext. Wrist Flex.  R 5 5 5 5 5 5 5   L 5 5 5 5 5 5 5    Side Iliopsoas Quads Hamstring PF DF EHL  R 5 5 5 5 5 5   L - - - 5 5 5     Bilateral upper and lower extremity sensation is intact to light touch. Reflexes are 1+ and symmetric at the biceps, triceps, brachioradialis, patella and achilles. Hoffman's is absent.  Clonus is deferred. Gait testing is deferred.   Imaging: CT Head 05/07/2021 IMPRESSION: 1. Intraventricular blood layering in the occipital horns appears slightly increased since the prior study, though this may reflect redistribution. Additional intraventricular and intraparenchymal blood products are not significantly changed. 2. Stable size and configuration of the ventricular system.     Electronically Signed   By: Michele Lambert M.D.   On: 05/07/2021 09:24  I have personally reviewed the images and agree with the above interpretation.  Labs: CBC Latest Ref Rng & Units 05/07/2021 08/07/2020 05/06/2018  WBC 4.0 - 10.5 K/uL 17.3(H) 11.6(H) 9.5  Hemoglobin 12.0 - 15.0 g/dL 11.1(L) 13.1 11.1(L)  Hematocrit 36.0 - 46.0 % 34.5(L) 38.9 34.1(L)  Platelets 150 - 400 K/uL 246 226 PLATELET CLUMPS NOTED ON SMEAR, UNABLE TO ESTIMATE    Assessment and Plan: [name] is a pleasant 88 y.o. female with hemorrhage in the R putamen with IVH extension. ICH score 2 with 26% estimated 30 day mortality.  This hemorrhage is not a common traumatic pattern, but fits well for a hypertensive hemorrhage.  - Would consult neurology for ongoing recommendations - I think the repeat head CT is primarily changed due to layering and redistribution of the intraventricular hemorrhage.  From my perspective, she  is cleared to go to the OR for her hip fracture. Keep SBP <160 during the case - Please repeat head CT after the OR case - Other management per medicine/neurology      Michele Gal K. Izora Ribas MD, [place] Dept. of Neurosurgery

## 2021-05-07 NOTE — Progress Notes (Signed)
Neurology consulted on this patient with IPH and broken hip 2/2 fall. She is in OR and unavailable to be seen today, so neurology will see her tomorrow AM. She has already been seen by Dr. Izora Ribas with [facility]. At baseline she has severe dementia and is oriented only to self and unable to give history. Head CT showed hemorrhage in the R putamen with IVH extension (ICH score 2 = 26% mortality). Repeat head CT this AM was stable and she was cleared to go to OR for repair of her hip fracture. Typically IPH would be transferred to [facility] but this was not felt to be consistent with family's GOC as they told [facility] they would not want surgical intervention for the bleed if she were to deteriorate. As such, she was admitted to [facility].  Interim guidance: - Repeat head CT after OR case - [facility] to see her this evening after OR case, neurology will assume care tomorrow - Goal SBP <160; labetalol IV or clevidipine gtt from BP above range - Recommend holding pharm DVT prophylaxis, antiplatelets, and anticoagulation in the setting of acute IPH - NPO until swallow screen - PT/OT/SLP - SCDs DVT prophylaxis  I will f/u tmrw.  Su Monks, MD [facility] [phone]  If 7pm- 7am, please page neurology on call as listed in [place].

## 2021-05-07 NOTE — Progress Notes (Signed)
Initial Nutrition Assessment  DOCUMENTATION CODES:   Not applicable  INTERVENTION:   -Once diet has advanced, add:  -Mighty Shake TID with meals, each supplement provides 220 kcals and 6 grams protein -MVI with minerals daily  NUTRITION DIAGNOSIS:   Increased nutrient needs related to post-op healing as evidenced by estimated needs.  GOAL:   Patient will meet greater than or equal to 90% of their needs  MONITOR:   PO intake, Supplement acceptance, Diet advancement, Labs, Weight trends, Skin, I & O's  REASON FOR ASSESSMENT:   Consult Assessment of nutrition requirement/status, Hip fracture protocol  ASSESSMENT:   Michele Lambert is a 88 y.o. female with medical history significant of Dementia, HTN, hypothyroidism, who presents to the ED from assisted living facility following a witnessed fall.  Staff told EMS that he had a thud in the patient's room and they found her on the floor.  She complains of left hip pain.  She was previously in her usual state of health.  She denies chest pain, shortness of breath, headache or neck pain, nausea or vomiting  Pt admitted with closed lt hip fracture.   Per orthopedics notes, plan for intramedullary fixation today. Neurosurgery has cleared pt for surgery (pt with intraparenchymal hemorrhage).   Pt unavailable at time of visit. RD unable to obtain further nutrition-related history or complete nutrition-focused physical exam at this time.    Reviewed wt hx; wt has been stable over the past 9 months.   Per SLP notes, plan for BSE post-operatively.   Pt would benefit from addition of oral nutrition supplements to support post-operative healing.   Medications reviewed and include 0.9% sodium chloride infusion @ 75 ml/hr.   Labs reviewed.  Diet Order:   Diet Order             Diet NPO time specified Except for: Sips with Meds  Diet effective now                   EDUCATION NEEDS:   Not appropriate for education at this  time  Skin:  Skin Assessment: Reviewed RN Assessment  Last BM:  Unknown  Height:   Ht Readings from Last 1 Encounters:  05/07/21 5' (1.524 m)    Weight:   Wt Readings from Last 1 Encounters:  05/07/21 68.4 kg    Ideal Body Weight:  45.5 kg  BMI:  Body mass index is 29.45 kg/m.  Estimated Nutritional Needs:   Kcal:  1400-1600  Protein:  55-70 grams  Fluid:  > 1.4 L    Loistine Chance, RD, LDN, [place] Registered Dietitian II Certified Diabetes Care and Education Specialist Please refer to [facility] for RD and/or RD on-call/weekend/after hours pager

## 2021-05-07 NOTE — ED Notes (Signed)
Patient return from CT. Dr. Beather Arbour at the bedside speaking with pt's son.

## 2021-05-07 NOTE — ED Notes (Signed)
Pt transferred to OR, son at bedside. Patient transported via stretcher by OR tech.

## 2021-05-07 NOTE — ED Notes (Signed)
Patient to CT.

## 2021-05-08 DIAGNOSIS — I610 Nontraumatic intracerebral hemorrhage in hemisphere, subcortical: Secondary | ICD-10-CM

## 2021-05-08 DIAGNOSIS — S06300A Unspecified focal traumatic brain injury without loss of consciousness, initial encounter: Secondary | ICD-10-CM

## 2021-05-08 LAB — BASIC METABOLIC PANEL
Anion gap: 6 (ref 5–15)
BUN: 28 mg/dL — ABNORMAL HIGH (ref 8–23)
CO2: 26 mmol/L (ref 22–32)
Calcium: 7.8 mg/dL — ABNORMAL LOW (ref 8.9–10.3)
Chloride: 104 mmol/L (ref 98–111)
Creatinine, Ser: 1.04 mg/dL — ABNORMAL HIGH (ref 0.44–1.00)
GFR, Estimated: 50 mL/min — ABNORMAL LOW (ref >60)
Glucose, Bld: 125 mg/dL — ABNORMAL HIGH (ref 70–99)
Potassium: 4 mmol/L (ref 3.5–5.1)
Sodium: 136 mmol/L (ref 135–145)

## 2021-05-08 LAB — CBC
HCT: 25.7 % — ABNORMAL LOW (ref 36.0–46.0)
Hemoglobin: 8.4 g/dL — ABNORMAL LOW (ref 12.0–15.0)
MCH: 28.3 pg (ref 26.0–34.0)
MCHC: 32.7 g/dL (ref 30.0–36.0)
MCV: 86.5 fL (ref 80.0–100.0)
Platelets: 171 10*3/uL (ref 150–400)
RBC: 2.97 MIL/uL — ABNORMAL LOW (ref 3.87–5.11)
RDW: 14.8 % (ref 11.5–15.5)
WBC: 12.6 10*3/uL — ABNORMAL HIGH (ref 4.0–10.5)
nRBC: 0 % (ref 0.0–0.2)

## 2021-05-08 MED ORDER — POLYETHYLENE GLYCOL 3350 17 G PO PACK
17.0000 g | PACK | Freq: Every day | ORAL | Status: DC
  Administered 2021-05-08 – 2021-05-11 (×3): 17 g via ORAL
  Filled 2021-05-08 (×3): qty 1

## 2021-05-08 MED ORDER — ENOXAPARIN SODIUM 30 MG/0.3ML IJ SOSY
30.0000 mg | PREFILLED_SYRINGE | INTRAMUSCULAR | Status: DC
  Administered 2021-05-08 – 2021-05-10 (×3): 30 mg via SUBCUTANEOUS
  Filled 2021-05-08 (×2): qty 0.3

## 2021-05-08 NOTE — Progress Notes (Addendum)
PROGRESS NOTE    Michele Lambert  EAV:[id number] DOB: [birth year] [name]: 05/07/2021 PCP: Dion Body, MD  Outpatient Specialists: none    Brief Narrative:   Hx dementia, htn, hypothyroid, presented AM of 2/3 after unwitnessed fall at her nursing home. Staff heard a "thud" and found the patient on the floor complaining of left hip pain. At baseline ambulates with a walker. Found to have left intratrochanteric fracture as well as intraparenchymal hemorrhage on CT of brain.   Assessment & Plan:   Active Problems:   Hypothyroidism   Dementia ([place])   Traumatic intraparenchymal hemorrhage   Closed intertrochanteric fracture of left hip, initial encounter (HCC)   CKD (chronic kidney disease) stage 3, GFR 30-59 ml/min (HCC)   Age-related osteoporosis without current pathological fracture   Essential hypertension  # Left comminuted intratrochanteric fracture S/p intramedullary fixation on 2/3 w/ ortho. Hgb 8.4. pain controlled. - neurosurg ok with starting lovenox for ppx - plan for skilled nursing at d/c - monitor hgb - pain control, bowel regimen  # Traumatic intraparenchymal hemorrhage CT shows intraparenchymal hemorrhage in right basal ganglia w/ intraventricular extension w/o evidence hydrocephalus, mass effect, or midline shift. Neurosurgery and neurology consulted. HCPOA told admitting providers would not want neurosurgical intervention. Relatively stable on repeat CT. Goal normotension - neurosurg has signed off - neurology yet to see today - cleared for dysphagia diet by SLP  # Hypothyroid - home synthroid  # HTN Here bp wnl - cont home losartan,  - home hctz on hold   DVT prophylaxis: lovenox Code Status: DNR Family Communication: called all 3 children listed as contacts, no answer  Level of care: Progressive Status is: Inpatient Remains inpatient appropriate because: severity of illness            Consultants:  Ortho, neuro,  neurosurg  Procedures: none  Antimicrobials:  none    Subjective: Denies pain  Objective: Vitals:   05/08/21 0055 05/08/21 0407 05/08/21 0827 05/08/21 1055  BP: 108/60 (!) 107/52 124/70 (!) 111/58  Pulse: 93 94 97 89  Resp: 19 18 17 17   Temp:   99.8 F (37.7 C) 99.2 F (37.3 C)  TempSrc:   Oral Axillary  SpO2: 95% 94% 95% 98%  Weight:      Height:        Intake/Output Summary (Last 24 hours) at 05/08/2021 1442 Last data filed at 05/08/2021 1231 Gross per 24 hour  Intake 769.37 ml  Output 50 ml  Net 719.37 ml   Filed Weights   05/07/21 0230  Weight: 68.4 kg    Examination:  General exam: Appears calm and comfortable  Respiratory system: Clear to auscultation. Respiratory effort normal. Cardiovascular system: S1 & S2 heard, RRR. No JVD, murmurs, rubs, gallops or clicks. 1+ pedal edema Gastrointestinal system: Abdomen is nondistended, soft and nontender. No organomegaly or masses felt. Normal bowel sounds heard. Central nervous system: sleepy but arouses, moves all 4 ext Extremities: warm Skin: dressings left hip clean and dry Psychiatry: calm, confused    Data Reviewed: I have personally reviewed following labs and imaging studies  CBC: Recent Labs  Lab 05/07/21 0941 05/08/21 0430  WBC 17.3* 12.6*  NEUTROABS 14.5*  --   HGB 11.1* 8.4*  HCT 34.5* 25.7*  MCV 87.6 86.5  PLT 246 782   Basic Metabolic Panel: Recent Labs  Lab 05/07/21 0941 05/08/21 0430  NA 135 136  K 3.9 4.0  CL 99 104  CO2 28 26  GLUCOSE 118* 125*  BUN  29* 28*  CREATININE 0.97 1.04*  CALCIUM 8.8* 7.8*   GFR: Estimated Creatinine Clearance: 29.8 mL/min (A) (by C-G formula based on SCr of 1.04 mg/dL (H)). Liver Function Tests: Recent Labs  Lab 05/07/21 0941  AST 29  ALT 15  ALKPHOS 53  BILITOT 0.6  PROT 6.4*  ALBUMIN 3.5   No results for input(s): LIPASE, AMYLASE in the last 168 hours. No results for input(s): AMMONIA in the last 168 hours. Coagulation Profile: No  results for input(s): INR, PROTIME in the last 168 hours. Cardiac Enzymes: No results for input(s): CKTOTAL, CKMB, CKMBINDEX, TROPONINI in the last 168 hours. BNP (last 3 results) No results for input(s): PROBNP in the last 8760 hours. HbA1C: No results for input(s): HGBA1C in the last 72 hours. CBG: No results for input(s): GLUCAP in the last 168 hours. Lipid Profile: No results for input(s): CHOL, HDL, LDLCALC, TRIG, CHOLHDL, LDLDIRECT in the last 72 hours. Thyroid Function Tests: No results for input(s): TSH, T4TOTAL, FREET4, T3FREE, THYROIDAB in the last 72 hours. Anemia Panel: No results for input(s): VITAMINB12, FOLATE, FERRITIN, TIBC, IRON, RETICCTPCT in the last 72 hours. Urine analysis:    Component Value Date/Time   COLORURINE STRAW (A) 08/07/2020 1528   APPEARANCEUR CLEAR (A) 08/07/2020 1528   LABSPEC 1.005 08/07/2020 1528   PHURINE 8.0 08/07/2020 1528   GLUCOSEU NEGATIVE 08/07/2020 1528   HGBUR NEGATIVE 08/07/2020 1528   BILIRUBINUR NEGATIVE 08/07/2020 1528   KETONESUR NEGATIVE 08/07/2020 1528   PROTEINUR NEGATIVE 08/07/2020 1528   NITRITE NEGATIVE 08/07/2020 1528   LEUKOCYTESUR NEGATIVE 08/07/2020 1528   Sepsis Labs: @LABRCNTIP (procalcitonin:4,lacticidven:4)  ) Recent Results (from the past 240 hour(s))  Resp Panel by RT-PCR (Flu A&B, Covid) Nasopharyngeal Swab     Status: None   Collection Time: 05/07/21  3:26 AM   Specimen: Nasopharyngeal Swab; Nasopharyngeal(NP) swabs in vial transport medium  Result Value Ref Range Status   SARS Coronavirus 2 by RT PCR NEGATIVE NEGATIVE Final    Comment: (NOTE) SARS-CoV-2 target nucleic acids are NOT DETECTED.  The SARS-CoV-2 RNA is generally detectable in upper respiratory specimens during the acute phase of infection. The lowest concentration of SARS-CoV-2 viral copies this assay can detect is 138 copies/mL. A negative result does not preclude SARS-Cov-2 infection and should not be used as the sole basis for treatment  or other patient management decisions. A negative result may occur with  improper specimen collection/handling, submission of specimen other than nasopharyngeal swab, presence of viral mutation(s) within the areas targeted by this assay, and inadequate number of viral copies(<138 copies/mL). A negative result must be combined with clinical observations, patient history, and epidemiological information. The expected result is Negative.  Fact Sheet for Patients:  EntrepreneurPulse.com.au  Fact Sheet for Healthcare Providers:  IncredibleEmployment.be  This test is no t yet approved or cleared by the [place] FDA and  has been authorized for detection and/or diagnosis of SARS-CoV-2 by FDA under an Emergency Use Authorization (EUA). This EUA will remain  in effect (meaning this test can be used) for the duration of the COVID-19 declaration under Section 564(b)(1) of the Act, 21 U.S.C.section 360bbb-3(b)(1), unless the authorization is terminated  or revoked sooner.       Influenza A by PCR NEGATIVE NEGATIVE Final   Influenza B by PCR NEGATIVE NEGATIVE Final    Comment: (NOTE) The Xpert Xpress SARS-CoV-2/FLU/RSV plus assay is intended as an aid in the diagnosis of influenza from Nasopharyngeal swab specimens and should not be used as a  sole basis for treatment. Nasal washings and aspirates are unacceptable for Xpert Xpress SARS-CoV-2/FLU/RSV testing.  Fact Sheet for Patients: EntrepreneurPulse.com.au  Fact Sheet for Healthcare Providers: IncredibleEmployment.be  This test is not yet approved or cleared by the [place] FDA and has been authorized for detection and/or diagnosis of SARS-CoV-2 by FDA under an Emergency Use Authorization (EUA). This EUA will remain in effect (meaning this test can be used) for the duration of the COVID-19 declaration under Section 564(b)(1) of the Act, 21 U.S.C. section  360bbb-3(b)(1), unless the authorization is terminated or revoked.  Performed at [facility], [address] [place], [place] [address]          Radiology Studies: CT HEAD WO CONTRAST (5MM)  Result Date: 05/07/2021 CLINICAL DATA:  Intraparenchymal hemorrhage. EXAM: CT HEAD WITHOUT CONTRAST TECHNIQUE: Contiguous axial images were obtained from the base of the skull through the vertex without intravenous contrast. RADIATION DOSE REDUCTION: This exam was performed according to the departmental dose-optimization program which includes automated exposure control, adjustment of the mA and/or kV according to patient size and/or use of iterative reconstruction technique. COMPARISON:  Of same day. FINDINGS: Brain: Grossly stable size and appearance of right basal ganglia hemorrhage with hemorrhage present in bilateral lateral and third ventricles as well. Stable ventricular size is noted. Cortical atrophy is again noted. No mass effect or midline shift is noted. No new hemorrhage is noted. Vascular: No hyperdense vessel or unexpected calcification. Skull: Normal. Negative for fracture or focal lesion. Sinuses/Orbits: No acute finding. Other: None. IMPRESSION: Stable size and appearance of right basal ganglia intraparenchymal hemorrhage as well as intraventricular hemorrhage involving both lateral ventricles and third ventricle. Electronically Signed   By: Marijo Conception M.D.   On: 05/07/2021 16:01   CT Head Wo Contrast  Result Date: 05/07/2021 CLINICAL DATA:  Fall, intracranial hemorrhage EXAM: CT HEAD WITHOUT CONTRAST TECHNIQUE: Contiguous axial images were obtained from the base of the skull through the vertex without intravenous contrast. RADIATION DOSE REDUCTION: This exam was performed according to the departmental dose-optimization program which includes automated exposure control, adjustment of the mA and/or kV according to patient size and/or use of iterative reconstruction technique.  COMPARISON:  CT head obtained earlier the same day FINDINGS: Brain: Again seen is small volume intraparenchymal hemorrhage centered in the right thalamus and anterior limb of the internal capsule. There is no significant regional mass effect. Intraventricular blood in the bodies of the lateral ventricles, third ventricle, and layering in the occipital horns is again seen. Blood in the occipital horns appears slightly increased from the prior study, though this may reflect redistribution as previously seen blood in the right temporal horn is no longer seen. Blood elsewhere appears unchanged. The ventricular system is unchanged in size and configuration, with no evidence of developing hydrocephalus. Background parenchymal volume is unchanged. Patchy hypodensity in the subcortical and periventricular white matter likely reflecting sequela of chronic white matter microangiopathy is unchanged. There is no evidence of infarct. Vascular: There is calcification of the bilateral cavernous ICAs. Skull: Normal. Negative for fracture or focal lesion. Sinuses/Orbits: The imaged paranasal sinuses are clear. Bilateral lens implants are in place. The globes and orbits are otherwise unremarkable. Other: None. IMPRESSION: 1. Intraventricular blood layering in the occipital horns appears slightly increased since the prior study, though this may reflect redistribution. Additional intraventricular and intraparenchymal blood products are not significantly changed. 2. Stable size and configuration of the ventricular system. Electronically Signed   By: Court Joy.D.  On: 05/07/2021 09:24   CT Head Wo Contrast  Result Date: 05/07/2021 CLINICAL DATA:  Head trauma, fall EXAM: CT HEAD WITHOUT CONTRAST TECHNIQUE: Contiguous axial images were obtained from the base of the skull through the vertex without intravenous contrast. RADIATION DOSE REDUCTION: This exam was performed according to the departmental dose-optimization program which  includes automated exposure control, adjustment of the mA and/or kV according to patient size and/or use of iterative reconstruction technique. COMPARISON:  08/07/2020 FINDINGS: Brain: Intraparenchymal hemorrhage centered in the right basal ganglia. Minimal hypodensity surrounding the basal ganglia portion of the hemorrhage, likely edema. Intraventricular extension into the right-greater-than-left lateral ventricle and third ventricle. Small amount of hemorrhage in the occipital horn of the left lateral ventricle and temporal horn of the right lateral ventricle. Unchanged size and configuration of the ventricles. No significant mass effect or midline shift. No acute infarct or mass. No additional extra-axial collection. Periventricular white matter changes, likely the sequela of chronic small vessel ischemic disease. Vascular: No hyperdense vessel. Skull: Normal. Negative for fracture or focal lesion. Sinuses/Orbits: Bubbly fluid in the right sphenoid sinus. Status post bilateral lens replacements. Other: The mastoids are well aerated. IMPRESSION: 1. Intraparenchymal hemorrhage centered in the right basal ganglia, with intraventricular extension into the right-greater-than-left lateral ventricle and third ventricle, without evidence of hydrocephalus, significant mass effect, or midline shift. 2. Bubbly fluid in the right sphenoid sinus, as can be seen in the setting of sinusitis. Correlate with symptoms. These results were called by telephone at the time of interpretation on 05/07/2021 at 3:07 am to provider JADE SUNG , who verbally acknowledged these results. Electronically Signed   By: Merilyn Baba M.D.   On: 05/07/2021 03:10   DG Chest Port 1 View  Result Date: 05/07/2021 CLINICAL DATA:  Recent fall with chest pain, initial encounter EXAM: PORTABLE CHEST 1 VIEW COMPARISON:  08/07/2020 FINDINGS: Cardiac shadow is enlarged but stable. Aortic calcifications are noted. Distal left clavicular fracture is seen. The  lungs are clear bilaterally. No other fractures are noted. IMPRESSION: Distal left clavicular fracture. No acute abnormality is noted in the chest. Electronically Signed   By: Inez Catalina M.D.   On: 05/07/2021 03:20   DG C-Arm 1-60 Min-No Report  Result Date: 05/07/2021 CLINICAL DATA:  Left hip fracture. EXAM: DG HIP (WITH OR WITHOUT PELVIS) 2-3V LEFT; DG C-ARM 1-60 MIN-NO REPORT Fluoroscopy time: 1 minute 38 seconds. COMPARISON:  Same day. FINDINGS: Four intraoperative fluoroscopic images were obtained. These demonstrate intramedullary rod fixation proximal left femoral intertrochanteric fracture. Improved alignment of fracture components is noted. IMPRESSION: Fluoroscopic guidance provided during intramedullary rod fixation of left femur for treatment of proximal left femoral intertrochanteric fracture. Electronically Signed   By: Marijo Conception M.D.   On: 05/07/2021 14:37   DG HIP UNILAT WITH PELVIS 2-3 VIEWS LEFT  Result Date: 05/07/2021 CLINICAL DATA:  Left hip fracture. EXAM: DG HIP (WITH OR WITHOUT PELVIS) 2-3V LEFT; DG C-ARM 1-60 MIN-NO REPORT Fluoroscopy time: 1 minute 38 seconds. COMPARISON:  Same day. FINDINGS: Four intraoperative fluoroscopic images were obtained. These demonstrate intramedullary rod fixation proximal left femoral intertrochanteric fracture. Improved alignment of fracture components is noted. IMPRESSION: Fluoroscopic guidance provided during intramedullary rod fixation of left femur for treatment of proximal left femoral intertrochanteric fracture. Electronically Signed   By: Marijo Conception M.D.   On: 05/07/2021 14:37   DG Hip Unilat With Pelvis 2-3 Views Left  Result Date: 05/07/2021 CLINICAL DATA:  Recent fall with left hip pain, initial  encounter EXAM: DG HIP (WITH OR WITHOUT PELVIS) 3V LEFT COMPARISON:  None. FINDINGS: Pelvic ring is intact. Healed fractures are noted in the superior and inferior pubic rami on the right. Comminuted left intratrochanteric fracture is noted  with impaction and angulation at the fracture site. No dislocation is seen. IMPRESSION: Comminuted left intratrochanteric fracture as described. Old healed fractures are noted involving the pubic rami on the right. Electronically Signed   By: Inez Catalina M.D.   On: 05/07/2021 03:27   DG FEMUR PORT MIN 2 VIEWS LEFT  Result Date: 05/07/2021 CLINICAL DATA:  Post hip fracture intramedullary nailing. EXAM: LEFT FEMUR PORTABLE 2 VIEWS COMPARISON:  May 07, 2021 preoperative evaluation. FINDINGS: Post antegrade nailing of a comminuted intratrochanteric fracture that was seen previously with placement of a intramedullary rod and hip screw crossing the femoral neck. Near anatomic alignment. Distal interlocking screws are well noted as well, 2 distal interlocking screws. No unexpected findings with gas in the local soft tissues and skin staples over the LEFT hip and lower thigh. IMPRESSION: Post antegrade nailing of a comminuted intertrochanteric fracture, in near anatomic alignment and no unexpected findings. Electronically Signed   By: Zetta Bills M.D.   On: 05/07/2021 15:17        Scheduled Meds:  acetaminophen  500 mg Oral Q6H   Chlorhexidine Gluconate Cloth  6 each Topical Daily   docusate sodium  100 mg Oral BID   enoxaparin (LOVENOX) injection  30 mg Subcutaneous Q24H   levothyroxine  125 mcg Oral QAC breakfast   losartan  50 mg Oral QPM   multivitamin with minerals  1 tablet Oral Daily   senna  1 tablet Oral BID   traMADol  50 mg Oral Q6H   Continuous Infusions:  methocarbamol (ROBAXIN) IV       LOS: 1 day    Time spent: 25 min    Desma Maxim, MD [facility]   If 7PM-7AM, please contact night-coverage [email or web address] Password [email or web address] 05/08/2021, 2:42 PM

## 2021-05-08 NOTE — Evaluation (Signed)
Clinical/Bedside Swallow Evaluation Patient Details  Name: Michele Lambert MRN: [id number] Date of Birth: 03-Feb-1927  Today's Date: 05/08/2021 Time: SLP Start Time (ACUTE ONLY): 43 SLP Stop Time (ACUTE ONLY): 1055 SLP Time Calculation (min) (ACUTE ONLY): 50 min  Past Medical History:  Past Medical History:  Diagnosis Date   Dementia ([place])    Hypothyroidism    Past Surgical History:  Past Surgical History:  Procedure Laterality Date   ORIF HUMERUS FRACTURE Right 05/02/2018   Procedure: OPEN REDUCTION INTERNAL FIXATION (ORIF) DISTAL HUMERUS FRACTURE;  Surgeon: Hiram Gash, MD;  Location: [place];  Service: Orthopedics;  Laterality: Right;   HPI:  Pt  is a 88 y.o. female with medical history significant of Dementia, HTN, hypothyroidism, who presents to the ED from assisted living facility following a witnessed fall.  Staff told EMS that he had a thud in the patient's room and they found her on the floor.  She complains of left hip pain.  She was previously in her usual state of health.  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    Assessment / Plan / Recommendation  Clinical Impression  Pt appears to present w/ oral phase dysphagia c/b lengthy oral phase time for mastication of increased textured foods; suspect impact from declined Cognitive status, baseline Dementia. ANY Cognitive decline can impact overall awareness/engagement w/ po tasks which increases risk for aspiration, choking. Pt's risk for aspiration is present but reduced when following general aspiration precautions, when given Monitoring during oral intake for  support/encouragement to Hold Cup when Drinking, and when using a Modified diet consistency at this time d/t Acuity of illness/setting. She requires mod visual/tactile/verbal cues for orientation to bolus presentation; feeding support.   Pt consumed trials of ice chips, thin liquids, purees, and softened solids. No overt clinical s/s of aspiration noted; no decline in phonations, no cough, and no decline in respiratory status during/post trials. Oral phase was grossly [facility] for bolus management and oral clearing of liquids and purees, HOWEVER, increased and lengthy oral phase time was noted w/ increased textured foods. Pt exhibited lengthy mastication time/effort w/ increased attention to lingual sweeping to fully clear mouth -- ~25-40 secs per trials w/ various bites of french toast, sausage(softened solids cut small).  OM Exam revealed grossly functional lingual and labial movements w/ no gross unilateral weakness noted. No anterior bolus loss. Pt was clear when she spoke answering basic, direct questions. She required verbal/tactile cues w/ basic 1-step commands.  Pt was encouraged and given the Cup to Hold when Drinking to increase safety w/ drinking of thin liquids. Feeding support given w/ foods.  Recommend a Dysphagia level 2(minced foods w/ puree for ease of oral intake, nutrition at this time) w/ thin liquids VIA CUP -- no straws. General aspiration precautions; reduce Distractions during meals and engage pt in self-feeding w/ support offered. Check for oral clearing during/post intake as needed. Pills Crushed in Puree for safer swallowing. NSG/MD updated. NSG to reconsult ST services if new needs arise while admitted.  Recommend ST services f/u at SNF/Rehab for any trial upgrade of diet post the Acuity of  this illness/admit secondary to Baseline Dementia/Cognitive decline.  SLP Visit Diagnosis: Dysphagia, oral phase (R13.11) (baseline Dementia)    Aspiration Risk   (reduced following precautions  and helping to feed herself (holding cup to drink))    Diet Recommendation    Dysphagia level 2(minced foods w/ puree for ease of oral intake, nutrition at this time) w/ thin liquids VIA CUP -- no straws. General aspiration precautions; reduce Distractions during meals and engage pt in self-feeding w/ support offered. Check for oral clearing during/post intake as needed.  Medication Administration: Crushed with puree (vs Whole in puree for safer swallowing)    Other  Recommendations Recommended Consults:  (Dietician f/u) Oral Care Recommendations: Oral care BID;Oral care before and after PO;Staff/trained caregiver to provide oral care Other Recommendations:  (n/a)    Recommendations for follow up therapy are one component of a multi-disciplinary discharge planning process, led by the attending physician.  Recommendations may be updated based on patient status, additional functional criteria and insurance authorization.  Follow up Recommendations Follow physician's recommendations for discharge plan and follow up therapies (at SNF/Rehab)      Assistance Recommended at Discharge Frequent or constant Supervision/Assistance  Functional Status Assessment Patient has had a recent decline in their functional status and/or demonstrates limited ability to make significant improvements in function in a reasonable and predictable amount of time  Frequency and Duration  (n/a)   (n/a)       Prognosis Prognosis for Safe Diet Advancement: Fair Barriers to Reach Goals: Cognitive deficits;Time post onset;Severity of deficits;Motivation      Swallow Study   General Date of Onset: 05/07/21 HPI: Pt  is a 88 y.o. female with medical history significant of Dementia, HTN, hypothyroidism, who presents to the ED from assisted living facility following a witnessed fall.  Staff told EMS that he had a thud in the patient's room and they found her on the floor.  She complains of left hip pain.  She was previously  in her usual state of health.  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 Type of Study: Bedside Swallow Evaluation Previous Swallow Assessment: none Diet Prior to this Study: Regular;Thin liquids Temperature Spikes Noted: No (wbc 12.6) Respiratory Status: Room air History of Recent Intubation: No Behavior/Cognition: Alert;Cooperative;Pleasant mood;Confused;Distractible;Requires cueing (baseline Dementia) Oral Cavity Assessment: Dry Oral Care Completed by SLP: Yes Oral Cavity - Dentition: Adequate natural dentition Vision: Functional for self-feeding Self-Feeding Abilities: Able to feed self;Needs assist;Needs set up (encouraged to hold cup) Patient Positioning: Upright in bed (needed positioning) Baseline Vocal Quality: Normal Volitional Cough: Cognitively unable to elicit Volitional Swallow: Unable to elicit    Oral/Motor/Sensory Function Overall Oral Motor/Sensory Function: Within functional limits (no overt unilateral weakness)   Ice Chips Ice chips: Within functional limits Presentation: Spoon (fed; 3 trials)   Thin Liquid Thin Liquid: Within functional limits Presentation: Cup;Self Fed (encouraged to hold cup/supported; 10+ trials) Other Comments: water, juice    Nectar Thick Nectar Thick Liquid: Not tested   Honey Thick Honey Thick Liquid: Not tested   Puree Puree: Within functional limits Presentation: Spoon (fed; 10 trials)   Solid  Solid: Impaired Presentation: Spoon (fed; 5; 5 trials) Oral Phase Impairments: Impaired mastication;Poor awareness of bolus (lengthy oral phase time) Oral Phase  Functional Implications: Prolonged oral transit;Impaired mastication Pharyngeal Phase Impairments:  (none) Other Comments: moistening and alternating helped        Orinda Kenner, MS, CCC-SLP Speech Language Pathologist Rehab Services; [place] [phone] (ascom) [place] 05/08/2021,12:39 PM

## 2021-05-08 NOTE — Progress Notes (Addendum)
° °   Attending Progress Note  History: Michele Lambert is here for IPH with IVH and L femur fracture.   HD2: She is post-op day 1 from L femur ORIF. She has no complaints. Denies HA/N. HD1: She is post-op from L femur ORIF. She has no complaints.  Physical Exam: Vitals:   05/08/21 0055 05/08/21 0407  BP: 108/60 (!) 107/52  Pulse: 93 94  Resp: 19 18  Temp:    SpO2: 95% 94%    AA Oxself CNI  MAE with full strength in BUE.  LLE is sore due to surgery, but able to move lower extremities well.  Data:  Recent Labs  Lab 05/07/21 0941  NA 135  K 3.9  CL 99  CO2 28  BUN 29*  CREATININE 0.97  GLUCOSE 118*  CALCIUM 8.8*   Recent Labs  Lab 05/07/21 0941  AST 29  ALT 15  ALKPHOS 53     Recent Labs  Lab 05/07/21 0941 05/08/21 0430  WBC 17.3* 12.6*  HGB 11.1* 8.4*  HCT 34.5* 25.7*  PLT 246 171   No results for input(s): APTT, INR in the last 168 hours.       Other tests/results: HCT postop- stable overall  Assessment/Plan:  Michele Lambert is stable after ORIF of L femur, with R putamen IPH with IVH  - OK to start DVT prophylaxis - ok to mobilize with PT pending Dr. Harden Mo recommendation - I will sign off for now.  If needed, please call neurosurgery on call - No need for outpatient follow up.   Meade Maw MD, [facility] Department of Neurosurgery  10 minutes spent in total evaluation.

## 2021-05-08 NOTE — Progress Notes (Signed)
PHARMACIST - PHYSICIAN COMMUNICATION  CONCERNING:  Enoxaparin (Lovenox) for DVT Prophylaxis    RECOMMENDATION: Patient was prescribed enoxaprin 40mg  q24 hours for VTE prophylaxis.   Filed Weights   05/07/21 0230  Weight: 68.4 kg (150 lb 12.8 oz)    Body mass index is 29.45 kg/m.  Estimated Creatinine Clearance: 29.8 mL/min (A) (by C-G formula based on SCr of 1.04 mg/dL (H)).  Patient is candidate for enoxaparin 30mg  every 24 hours based on CrCl <82ml/min   DESCRIPTION: Pharmacy has adjusted enoxaparin dose per [facility] policy.  Patient is now receiving enoxaparin 30 mg every 24 hours    Dallie Piles, PharmD Clinical Pharmacist  05/08/2021 8:29 AM

## 2021-05-08 NOTE — Evaluation (Signed)
Occupational Therapy Evaluation Patient Details Name: Michele Lambert MRN: [id number] DOB: May 21, 1926 Today's Date: 05/08/2021   History of Present Illness Pt is a 88 y/o F admitted on 05/07/21 with c/c of a fall. Hip x-ray with comminuted left intertrochanteric fracture, CT head with intraparenchymal hemorrhage in the right basal ganglia without evidence of hydrocephalus, significant mass effect or midline shift. Pt underwent L IM fixation of L hip fx on 05/07/21 by Dr. Mack Guise & is WBAT. Neurosurgery was consulted for R putamen IPH with IVH but family has stated they don't want surgical intervention for the bleed if pt were to deteriorate. PMH: dementia, HTN, hypothyroidism   Clinical Impression   Chart reviewed, RN cleared pt for participation in evaluation. Co-tx completed with PT due to pt complexities and physical assist required. Pt pleasant throughout evaluation however oriented only to self with poor 1 step directive following. Pt appears with a L sided neglect, however unable to formally assess due to baseline poor cognition. Pt appears with a R sided preference and attending to the L only with visual, verbal, tactile cueing. Vision is is difficult to assess due to pt cognition. OT will continue to assess. Pt required TOTAL A for all mobility with 3 attempted STS with pt clearing bed less than 1 inch. MAX A -TOTAL A required for all ADL. Of note, when brushing teeth, white dissolvable pill noted to be pocketed in L side of pt cheek. RN notified and aware. Per chart reviewed, speech is also consulted on this pt therefore SLP notified as well. Pt is left as received, NAD, all needs met. OT will continue to follow while admitted, recommend discharge to STR to address functional deficits.      Recommendations for follow up therapy are one component of a multi-disciplinary discharge planning process, led by the attending physician.  Recommendations may be updated based on patient status, additional  functional criteria and insurance authorization.   Follow Up Recommendations  Skilled nursing-short term rehab (<3 hours/day)    Assistance Recommended at Discharge Frequent or constant Supervision/Assistance  Patient can return home with the following Two people to help with walking and/or transfers;A lot of help with bathing/dressing/bathroom;Help with stairs or ramp for entrance;Assistance with feeding;Direct supervision/assist for medications management    Functional Status Assessment  Patient has had a recent decline in their functional status and demonstrates the ability to make significant improvements in function in a reasonable and predictable amount of time.  Equipment Recommendations  Other (comment) (per next venue of care)    Recommendations for Other Services       Precautions / Restrictions Precautions Precautions: Fall Precaution Comments: L inattention Restrictions Weight Bearing Restrictions: Yes LLE Weight Bearing: Weight bearing as tolerated      Mobility Bed Mobility Overal bed mobility: Needs Assistance       Supine to sit: Total assist, +2 for physical assistance, +2 for safety/equipment, HOB elevated Sit to supine: Total assist, HOB elevated, +2 for safety/equipment, +2 for physical assistance        Transfers Overall transfer level: Needs assistance Equipment used: Rolling walker (2 wheels) Transfers: Sit to/from Stand Sit to Stand: Total assist, +2 physical assistance, From elevated surface, +2 safety/equipment                  Balance Overall balance assessment: Needs assistance Sitting-balance support: Feet supported, Bilateral upper extremity supported Sitting balance-Leahy Scale: Poor     Standing balance support: During functional activity, Bilateral upper extremity supported Standing  balance-Leahy Scale: Zero                             ADL either performed or assessed with clinical judgement   ADL Overall  ADL's : Needs assistance/impaired Eating/Feeding: Maximal assistance   Grooming: Wash/dry face;Oral care;Maximal assistance;Bed level           Upper Body Dressing : Maximal assistance;Sitting Upper Body Dressing Details (indicate cue type and reason): at edge of bed Lower Body Dressing: Total assistance;Bed level Lower Body Dressing Details (indicate cue type and reason): socks Toilet Transfer: Total assistance;Maximal assistance Toilet Transfer Details (indicate cue type and reason): attempted STS 3x, pt required TOTAL A for attempts with less than 1 inch clearence off of bed         Functional mobility during ADLs: Total assistance;+2 for physical assistance General ADL Comments: poor cognition affecting ADL participation on this date     Vision         Perception     Praxis      Pertinent Vitals/Pain Pain Assessment Pain Assessment: Faces Faces Pain Scale: Hurts a little bit Pain Descriptors / Indicators: Grimacing Pain Intervention(s): Limited activity within patient's tolerance, Repositioned, Monitored during session     Hand Dominance     Extremity/Trunk Assessment Upper Extremity Assessment Upper Extremity Assessment: LUE deficits/detail;Difficult to assess due to impaired cognition LUE Deficits / Details: pt appears with L sided inattention; does not attend to life side with R sided preference, pt will attempt to lift LUE into shoulder flexion with AAROM-however less than 1/4 full available AROM shoulder flexion noted on this date. Pt does not participate in elbow flexion/extension, wrist flexion/extension but that may be due to cognition. PROM is [facility] throughout.   Lower Extremity Assessment Lower Extremity Assessment: Difficult to assess due to impaired cognition   Cervical / Trunk Assessment Cervical / Trunk Assessment: Kyphotic   Communication Communication Communication: No difficulties   Cognition Arousal/Alertness: Awake/alert Behavior During  Therapy: Flat affect Overall Cognitive Status: No family/caregiver present to determine baseline cognitive functioning                                 General Comments: Pt oriented to self only, poor command following despite multi modal cueing throughout evaluation     General Comments       Exercises     Shoulder Instructions      Home Living Family/patient expects to be discharged to:: Assisted living                                 Additional Comments: pt unable to provide PLOF, prior living due to cognition      Prior Functioning/Environment Prior Level of Function : Patient poor historian/Family not available                        OT Problem List: Decreased strength;Decreased activity tolerance;Decreased cognition;Decreased knowledge of use of DME or AE;Impaired UE functional use;Decreased range of motion;Impaired balance (sitting and/or standing);Decreased safety awareness;Decreased knowledge of precautions      OT Treatment/Interventions: Self-care/ADL training;Energy conservation;Modalities;Balance training;Therapeutic exercise;DME and/or AE instruction;Patient/family education;Therapeutic activities;Neuromuscular education    OT Goals(Current goals can be found in the care plan section) Acute Rehab OT Goals OT Goal Formulation: Patient unable to participate  in goal setting ADL Goals Pt Will Perform Grooming: with min assist Pt Will Perform Lower Body Bathing: with mod assist Pt Will Perform Upper Body Dressing: with mod assist Pt Will Perform Lower Body Dressing: with mod assist Pt Will Transfer to Toilet: with mod assist Pt Will Perform Toileting - Clothing Manipulation and hygiene: with mod assist  OT Frequency: Min 3X/week    Co-evaluation PT/OT/SLP Co-Evaluation/Treatment: Yes Reason for Co-Treatment: Complexity of the patient's impairments (multi-system involvement);For patient/therapist safety;Necessary to address  cognition/behavior during functional activity;To address functional/ADL transfers PT goals addressed during session: Mobility/safety with mobility;Balance;Proper use of DME;Strengthening/ROM OT goals addressed during session: ADL's and self-care      AM-PAC OT "6 Clicks" Daily Activity     Outcome Measure Help from another person eating meals?: A Lot Help from another person taking care of personal grooming?: Total Help from another person toileting, which includes using toliet, bedpan, or urinal?: Total Help from another person bathing (including washing, rinsing, drying)?: A Lot Help from another person to put on and taking off regular upper body clothing?: A Lot Help from another person to put on and taking off regular lower body clothing?: Total 6 Click Score: 9   End of Session Equipment Utilized During Treatment: Gait belt;Rolling walker (2 wheels) Nurse Communication: Mobility status;Other (comment) (medication pocketed in mouth)  Activity Tolerance: Other (comment);Patient tolerated treatment well (participation limited by cognition) Patient left: in bed;with call bell/phone within reach;with bed alarm set  OT Visit Diagnosis: History of falling (Z91.81);Other abnormalities of gait and mobility (R26.89);Other symptoms and signs involving cognitive function                Time: 7703-4035 OT Time Calculation (min): 23 min Charges:  OT General Charges $OT Visit: 1 Visit OT Evaluation $OT Eval High Complexity: 1 High  Shanon Payor, OTD OTR/L  05/08/21, 11:26 AM

## 2021-05-08 NOTE — NC FL2 (Signed)
[place] LEVEL OF CARE SCREENING TOOL     IDENTIFICATION  Patient Name: Michele Lambert Birthdate: [birth year] Sex: female Admission Date (Current Location): 05/07/2021  [facility] and [place] Number:  Engineering geologist and Address:  [facility], [address] [place], [place] [address]      Provider Number: [id number]  Attending Physician Name and Address:  Gwynne Edinger, MD  Relative Name and Phone Number:  Raquel Sarna (daughter) [phone]    Current Level of Care: Hospital Recommended Level of Care: [place] Prior Approval Number:    Date Approved/Denied:   PASRR Number: [id number]  Discharge Plan: SNF    Current Diagnoses: Patient Active Problem List   Diagnosis Date Noted   Traumatic intraparenchymal hemorrhage 05/07/2021   Closed intertrochanteric fracture of left hip, initial encounter ([place]) 05/07/2021   Age-related osteoporosis without current pathological fracture 05/07/2021   Essential hypertension 05/07/2021   CKD (chronic kidney disease) stage 3, GFR 30-59 ml/min ([place]) 10/01/2019   Fracture of distal humerus 05/03/2018   Displaced fracture of right humerus 05/01/2018   Hypothyroidism 05/01/2018   Dementia ([place]) 05/01/2018    Orientation RESPIRATION BLADDER Height & Weight     Self  Normal Incontinent, External catheter Weight: 150 lb 12.8 oz (68.4 kg) Height:  5' (152.4 cm)  BEHAVIORAL SYMPTOMS/MOOD NEUROLOGICAL BOWEL NUTRITION STATUS      Incontinent Diet (see discharge summary)  AMBULATORY STATUS COMMUNICATION OF NEEDS Skin   Extensive Assist Verbally Other (Comment) (left hip closed incision)                       Personal Care Assistance Level of Assistance  Bathing, Feeding, Dressing, Total care Bathing Assistance: Maximum assistance Feeding assistance: Limited assistance Dressing Assistance: Maximum assistance Total Care Assistance: Maximum assistance   Functional Limitations  Info  Sight, Hearing, Speech Sight Info: Impaired Hearing Info: Impaired Speech Info: Adequate    SPECIAL CARE FACTORS FREQUENCY  PT (By licensed PT), OT (By licensed OT)     PT Frequency: min 4x weekly OT Frequency: min 4x weekly            Contractures Contractures Info: Not present    Additional Factors Info  Code Status, Allergies Code Status Info: DNR Allergies Info: Codeine, lescol (fluvastatin sodium)           Current Medications (05/08/2021):  This is the current hospital active medication list Current Facility-Administered Medications  Medication Dose Route Frequency Provider Last Rate Last Admin   acetaminophen (TYLENOL) tablet 325-650 mg  325-650 mg Oral Q6H PRN Thornton Park, MD       acetaminophen (TYLENOL) tablet 500 mg  500 mg Oral Q6H Thornton Park, MD   500 mg at 05/08/21 0848   alum & mag hydroxide-simeth (MAALOX/MYLANTA) 200-200-20 MG/5ML suspension 30 mL  30 mL Oral Q4H PRN Thornton Park, MD       bisacodyl (DULCOLAX) suppository 10 mg  10 mg Rectal Daily PRN Thornton Park, MD       Chlorhexidine Gluconate Cloth 2 % PADS 6 each  6 each Topical Daily Athena Masse, MD   6 each at 05/08/21 0848   docusate sodium (COLACE) capsule 100 mg  100 mg Oral BID Thornton Park, MD   100 mg at 05/08/21 0848   enoxaparin (LOVENOX) injection 30 mg  30 mg Subcutaneous Q24H Dallie Piles, RPH   30 mg at 05/08/21 0848   HYDROcodone-acetaminophen (NORCO/VICODIN) 5-325 MG per  tablet 1-2 tablet  1-2 tablet Oral Q4H PRN Thornton Park, MD       labetalol (NORMODYNE) injection 20 mg  20 mg Intravenous Q10 min PRN Thornton Park, MD       levothyroxine (SYNTHROID) tablet 125 mcg  125 mcg Oral QAC breakfast Thornton Park, MD       losartan (COZAAR) tablet 50 mg  50 mg Oral QPM Thornton Park, MD       menthol-cetylpyridinium (CEPACOL) lozenge 3 mg  1 lozenge Oral PRN Thornton Park, MD       Or   phenol (CHLORASEPTIC) mouth spray 1 spray  1 spray  Mouth/Throat PRN Thornton Park, MD       methocarbamol (ROBAXIN) tablet 500 mg  500 mg Oral Q6H PRN Thornton Park, MD       Or   methocarbamol (ROBAXIN) 500 mg in dextrose 5 % 50 mL IVPB  500 mg Intravenous Q6H PRN Thornton Park, MD       morphine (PF) 2 MG/ML injection 0.5-1 mg  0.5-1 mg Intravenous Q2H PRN Thornton Park, MD       multivitamin with minerals tablet 1 tablet  1 tablet Oral Daily Thornton Park, MD   1 tablet at 05/08/21 0848   ondansetron (ZOFRAN) tablet 4 mg  4 mg Oral Q6H PRN Thornton Park, MD       Or   ondansetron [facility]) injection 4 mg  4 mg Intravenous Q6H PRN Thornton Park, MD       polyethylene glycol (MIRALAX / GLYCOLAX) packet 17 g  17 g Oral Daily PRN Thornton Park, MD       senna Donavan Burnet) tablet 8.6 mg  1 tablet Oral BID Thornton Park, MD   8.6 mg at 05/08/21 0848   traMADol (ULTRAM) tablet 50 mg  50 mg Oral Q6H Thornton Park, MD         Discharge Medications: Please see discharge summary for a list of discharge medications.  Relevant Imaging Results:  Relevant Lab Results:   Additional Information CHY:[id number]  Alberteen Sam, LCSW

## 2021-05-08 NOTE — Evaluation (Signed)
Physical Therapy Evaluation Patient Details Name: Michele Lambert MRN: [id number] DOB: [birth year] Today's Date: 05/08/2021  History of Present Illness  Pt is a 88 y/o F admitted on 05/07/21 with c/c of a fall. Hip x-ray with comminuted left intertrochanteric fracture, CT head with intraparenchymal hemorrhage in the right basal ganglia without evidence of hydrocephalus, significant mass effect or midline shift. Pt underwent L IM fixation of L hip fx on 05/07/21 by Dr. Mack Guise & is WBAT. Neurosurgery was consulted for R putamen IPH with IVH but family has stated they don't want surgical intervention for the bleed if pt were to deteriorate. PMH: dementia, HTN, hypothyroidism  Clinical Impression  Pt seen for PT evaluation with PT/OT co-tx for pt & therapists' safety. Pt presents with impaired cognition, AxOx self only but per chart this is baseline. On this date, pt requires +2 for bed mobility & for sit>stand x 3 trials from elevated EOB with pt barely able to clear buttocks. PT performs PROM LLE with mild grimacing noted at end range. Pt presents with L inattention as well with minimal to no elective use of LUE during session. Pt left in bed in upright position set up with breakfast tray but nurse notified of pt's need for assistance with meals.       Recommendations for follow up therapy are one component of a multi-disciplinary discharge planning process, led by the attending physician.  Recommendations may be updated based on patient status, additional functional criteria and insurance authorization.  Follow Up Recommendations Skilled nursing-short term rehab (<3 hours/day)    Assistance Recommended at Discharge Frequent or constant Supervision/Assistance  Patient can return home with the following  Two people to help with walking and/or transfers;Two people to help with bathing/dressing/bathroom;Direct supervision/assist for medications management;Help with stairs or ramp for entrance;Assistance with  feeding;Assist for transportation;Direct supervision/assist for financial management;Assistance with cooking/housework    Equipment Recommendations None recommended by PT  Recommendations for Other Services       Functional Status Assessment Patient has had a recent decline in their functional status and demonstrates the ability to make significant improvements in function in a reasonable and predictable amount of time.     Precautions / Restrictions Precautions Precautions: Fall Precaution Comments: L inattention Restrictions Weight Bearing Restrictions: Yes LLE Weight Bearing: Weight bearing as tolerated      Mobility  Bed Mobility Overal bed mobility: Needs Assistance Bed Mobility: Supine to Sit, Sit to Supine     Supine to sit: Total assist, +2 for physical assistance, +2 for safety/equipment, HOB elevated Sit to supine: Total assist, HOB elevated, +2 for safety/equipment, +2 for physical assistance        Transfers Overall transfer level: Needs assistance Equipment used: Rolling walker (2 wheels) Transfers: Sit to/from Stand Sit to Stand: Total assist, +2 physical assistance, From elevated surface, +2 safety/equipment           General transfer comment: PT & OT provide total assist +2 for sit>stand at elevated EOB but pt barely able to clear buttocks from EOB despite 3 attempts. Pt states "oh no oh no" during transfer but unable to elaborate.    Ambulation/Gait                  Stairs            Wheelchair Mobility    Modified Rankin (Stroke Patients Only)       Balance Overall balance assessment: Needs assistance Sitting-balance support: Feet supported, Bilateral upper extremity supported Sitting  balance-Leahy Scale: Poor     Standing balance support: During functional activity, Bilateral upper extremity supported Standing balance-Leahy Scale: Zero                               Pertinent Vitals/Pain Pain  Assessment Pain Assessment: Faces Faces Pain Scale: Hurts a little bit Pain Location: L hip with PT performing PROM Pain Descriptors / Indicators: Grimacing Pain Intervention(s): Monitored during session, Repositioned    Home Living Family/patient expects to be discharged to:: Assisted living                   Additional Comments: Pt unable to provide    Prior Function Prior Level of Function : Patient poor historian/Family not available                     Hand Dominance        Extremity/Trunk Assessment   Upper Extremity Assessment Upper Extremity Assessment: Generalized weakness;Defer to OT evaluation (Pt demonstrates L inattention)    Lower Extremity Assessment Lower Extremity Assessment: Difficult to assess due to impaired cognition (LLE 2-/5 knee extension in sitting)    Cervical / Trunk Assessment Cervical / Trunk Assessment: Kyphotic  Communication   Communication: No difficulties  Cognition Arousal/Alertness: Awake/alert Behavior During Therapy: Flat affect Overall Cognitive Status: History of cognitive impairments - at baseline                                 General Comments: Pleasant lady but AxOx self at baseline only, poor ability to follow simple 1 step commands during session        General Comments      Exercises General Exercises - Lower Extremity Heel Slides: PROM, Left, 5 reps, Supine Straight Leg Raises: PROM, Supine, Left, 5 reps   Assessment/Plan    PT Assessment Patient needs continued PT services  PT Problem List Decreased strength;Decreased mobility;Decreased safety awareness;Decreased range of motion;Decreased coordination;Decreased knowledge of precautions;Decreased activity tolerance;Decreased cognition;Cardiopulmonary status limiting activity;Pain;Impaired sensation;Decreased knowledge of use of DME;Decreased balance       PT Treatment Interventions DME instruction;Therapeutic exercise;Gait  training;Balance training;Wheelchair mobility training;Manual techniques;Neuromuscular re-education;Stair training;Functional mobility training;Cognitive remediation;Modalities;Therapeutic activities;Patient/family education    PT Goals (Current goals can be found in the Care Plan section)  Acute Rehab PT Goals PT Goal Formulation: Patient unable to participate in goal setting Time For Goal Achievement: 05/22/21 Potential to Achieve Goals: Poor    Frequency 7X/week     Co-evaluation PT/OT/SLP Co-Evaluation/Treatment: Yes Reason for Co-Treatment: Complexity of the patient's impairments (multi-system involvement);Necessary to address cognition/behavior during functional activity;For patient/therapist safety;To address functional/ADL transfers PT goals addressed during session: Mobility/safety with mobility;Balance;Proper use of DME;Strengthening/ROM         AM-PAC PT "6 Clicks" Mobility  Outcome Measure Help needed turning from your back to your side while in a flat bed without using bedrails?: Total Help needed moving from lying on your back to sitting on the side of a flat bed without using bedrails?: Total Help needed moving to and from a bed to a chair (including a wheelchair)?: Total Help needed standing up from a chair using your arms (e.g., wheelchair or bedside chair)?: Total Help needed to walk in hospital room?: Total Help needed climbing 3-5 steps with a railing? : Total 6 Click Score: 6    End of Session Equipment Utilized  During Treatment: Gait belt Activity Tolerance: Patient tolerated treatment well Patient left: in bed;with call bell/phone within reach;with bed alarm set;with SCD's reapplied Nurse Communication: Mobility status;Weight bearing status;Precautions PT Visit Diagnosis: Unsteadiness on feet (R26.81);Muscle weakness (generalized) (M62.81);Difficulty in walking, not elsewhere classified (R26.2)    Time: 8301-4159 PT Time Calculation (min) (ACUTE ONLY): 23  min   Charges:   PT Evaluation $PT Eval High Complexity: 1 High         Lavone Nian, PT, DPT 05/08/21, 10:22 AM   Waunita Schooner 05/08/2021, 10:20 AM

## 2021-05-08 NOTE — Progress Notes (Addendum)
Subjective:  POD #1 s/p intramedullary fixation for left IT hip fracture.   Patient reports no significant left hip pain.   Patient's daughter is at the bedside.  She states patient is not as engaged or responsive as she usually is.   Patient is a resident of the memory care unit of [facility].   The daughter states the patient will be accepted back to [facility] post-op and can receive physical therapy there.  Objective:   VITALS:   Vitals:   05/08/21 0055 05/08/21 0407 05/08/21 0827 05/08/21 1055  BP: 108/60 (!) 107/52 124/70 (!) 111/58  Pulse: 93 94 97 89  Resp: 19 18 17 17   Temp:   99.8 F (37.7 C) 99.2 F (37.3 C)  TempSrc:   Oral Axillary  SpO2: 95% 94% 95% 98%  Weight:      Height:        PHYSICAL EXAM: Left lower extremity: Neurovascular intact Sensation intact distally Intact pulses distally Dorsiflexion/Plantar flexion intact Incision: dressing C/D/I No cellulitis present Compartment soft  LABS  Results for orders placed or performed during the hospital encounter of 05/07/21 (from the past 24 hour(s))  Type and screen [place]     Status: None   Collection Time: 05/07/21 12:02 PM  Result Value Ref Range   ABO/RH(D) O NEG    Antibody Screen NEG    Sample Expiration      05/10/2021,2359 Performed at [facility], [address] [place], [place] [address]   CBC     Status: Abnormal   Collection Time: 05/08/21  4:30 AM  Result Value Ref Range   WBC 12.6 (H) 4.0 - 10.5 K/uL   RBC 2.97 (L) 3.87 - 5.11 MIL/uL   Hemoglobin 8.4 (L) 12.0 - 15.0 g/dL   HCT 25.7 (L) 36.0 - 46.0 %   MCV 86.5 80.0 - 100.0 fL   MCH 28.3 26.0 - 34.0 pg   MCHC 32.7 30.0 - 36.0 g/dL   RDW 14.8 11.5 - 15.5 %   Platelets 171 150 - 400 K/uL   nRBC 0.0 0.0 - 0.2 %  Basic metabolic panel     Status: Abnormal   Collection Time: 05/08/21  4:30 AM  Result Value Ref Range   Sodium 136 135 - 145 mmol/L   Potassium 4.0 3.5 - 5.1 mmol/L   Chloride 104 98 -  111 mmol/L   CO2 26 22 - 32 mmol/L   Glucose, Bld 125 (H) 70 - 99 mg/dL   BUN 28 (H) 8 - 23 mg/dL   Creatinine, Ser 1.04 (H) 0.44 - 1.00 mg/dL   Calcium 7.8 (L) 8.9 - 10.3 mg/dL   GFR, Estimated 50 (L) >60 mL/min   Anion gap 6 5 - 15    CT HEAD WO CONTRAST (5MM)  Result Date: 05/07/2021 CLINICAL DATA:  Intraparenchymal hemorrhage. EXAM: CT HEAD WITHOUT CONTRAST TECHNIQUE: Contiguous axial images were obtained from the base of the skull through the vertex without intravenous contrast. RADIATION DOSE REDUCTION: This exam was performed according to the departmental dose-optimization program which includes automated exposure control, adjustment of the mA and/or kV according to patient size and/or use of iterative reconstruction technique. COMPARISON:  Of same day. FINDINGS: Brain: Grossly stable size and appearance of right basal ganglia hemorrhage with hemorrhage present in bilateral lateral and third ventricles as well. Stable ventricular size is noted. Cortical atrophy is again noted. No mass effect or midline shift is noted. No new hemorrhage is noted. Vascular: No  hyperdense vessel or unexpected calcification. Skull: Normal. Negative for fracture or focal lesion. Sinuses/Orbits: No acute finding. Other: None. IMPRESSION: Stable size and appearance of right basal ganglia intraparenchymal hemorrhage as well as intraventricular hemorrhage involving both lateral ventricles and third ventricle. Electronically Signed   By: Marijo Conception M.D.   On: 05/07/2021 16:01   CT Head Wo Contrast  Result Date: 05/07/2021 CLINICAL DATA:  Fall, intracranial hemorrhage EXAM: CT HEAD WITHOUT CONTRAST TECHNIQUE: Contiguous axial images were obtained from the base of the skull through the vertex without intravenous contrast. RADIATION DOSE REDUCTION: This exam was performed according to the departmental dose-optimization program which includes automated exposure control, adjustment of the mA and/or kV according to patient  size and/or use of iterative reconstruction technique. COMPARISON:  CT head obtained earlier the same day FINDINGS: Brain: Again seen is small volume intraparenchymal hemorrhage centered in the right thalamus and anterior limb of the internal capsule. There is no significant regional mass effect. Intraventricular blood in the bodies of the lateral ventricles, third ventricle, and layering in the occipital horns is again seen. Blood in the occipital horns appears slightly increased from the prior study, though this may reflect redistribution as previously seen blood in the right temporal horn is no longer seen. Blood elsewhere appears unchanged. The ventricular system is unchanged in size and configuration, with no evidence of developing hydrocephalus. Background parenchymal volume is unchanged. Patchy hypodensity in the subcortical and periventricular white matter likely reflecting sequela of chronic white matter microangiopathy is unchanged. There is no evidence of infarct. Vascular: There is calcification of the bilateral cavernous ICAs. Skull: Normal. Negative for fracture or focal lesion. Sinuses/Orbits: The imaged paranasal sinuses are clear. Bilateral lens implants are in place. The globes and orbits are otherwise unremarkable. Other: None. IMPRESSION: 1. Intraventricular blood layering in the occipital horns appears slightly increased since the prior study, though this may reflect redistribution. Additional intraventricular and intraparenchymal blood products are not significantly changed. 2. Stable size and configuration of the ventricular system. Electronically Signed   By: Valetta Mole M.D.   On: 05/07/2021 09:24   CT Head Wo Contrast  Result Date: 05/07/2021 CLINICAL DATA:  Head trauma, fall EXAM: CT HEAD WITHOUT CONTRAST TECHNIQUE: Contiguous axial images were obtained from the base of the skull through the vertex without intravenous contrast. RADIATION DOSE REDUCTION: This exam was performed  according to the departmental dose-optimization program which includes automated exposure control, adjustment of the mA and/or kV according to patient size and/or use of iterative reconstruction technique. COMPARISON:  08/07/2020 FINDINGS: Brain: Intraparenchymal hemorrhage centered in the right basal ganglia. Minimal hypodensity surrounding the basal ganglia portion of the hemorrhage, likely edema. Intraventricular extension into the right-greater-than-left lateral ventricle and third ventricle. Small amount of hemorrhage in the occipital horn of the left lateral ventricle and temporal horn of the right lateral ventricle. Unchanged size and configuration of the ventricles. No significant mass effect or midline shift. No acute infarct or mass. No additional extra-axial collection. Periventricular white matter changes, likely the sequela of chronic small vessel ischemic disease. Vascular: No hyperdense vessel. Skull: Normal. Negative for fracture or focal lesion. Sinuses/Orbits: Bubbly fluid in the right sphenoid sinus. Status post bilateral lens replacements. Other: The mastoids are well aerated. IMPRESSION: 1. Intraparenchymal hemorrhage centered in the right basal ganglia, with intraventricular extension into the right-greater-than-left lateral ventricle and third ventricle, without evidence of hydrocephalus, significant mass effect, or midline shift. 2. Bubbly fluid in the right sphenoid sinus, as can be seen  in the setting of sinusitis. Correlate with symptoms. These results were called by telephone at the time of interpretation on 05/07/2021 at 3:07 am to provider JADE SUNG , who verbally acknowledged these results. Electronically Signed   By: Merilyn Baba M.D.   On: 05/07/2021 03:10   DG Chest Port 1 View  Result Date: 05/07/2021 CLINICAL DATA:  Recent fall with chest pain, initial encounter EXAM: PORTABLE CHEST 1 VIEW COMPARISON:  08/07/2020 FINDINGS: Cardiac shadow is enlarged but stable. Aortic  calcifications are noted. Distal left clavicular fracture is seen. The lungs are clear bilaterally. No other fractures are noted. IMPRESSION: Distal left clavicular fracture. No acute abnormality is noted in the chest. Electronically Signed   By: Inez Catalina M.D.   On: 05/07/2021 03:20   DG C-Arm 1-60 Min-No Report  Result Date: 05/07/2021 CLINICAL DATA:  Left hip fracture. EXAM: DG HIP (WITH OR WITHOUT PELVIS) 2-3V LEFT; DG C-ARM 1-60 MIN-NO REPORT Fluoroscopy time: 1 minute 38 seconds. COMPARISON:  Same day. FINDINGS: Four intraoperative fluoroscopic images were obtained. These demonstrate intramedullary rod fixation proximal left femoral intertrochanteric fracture. Improved alignment of fracture components is noted. IMPRESSION: Fluoroscopic guidance provided during intramedullary rod fixation of left femur for treatment of proximal left femoral intertrochanteric fracture. Electronically Signed   By: Marijo Conception M.D.   On: 05/07/2021 14:37   DG HIP UNILAT WITH PELVIS 2-3 VIEWS LEFT  Result Date: 05/07/2021 CLINICAL DATA:  Left hip fracture. EXAM: DG HIP (WITH OR WITHOUT PELVIS) 2-3V LEFT; DG C-ARM 1-60 MIN-NO REPORT Fluoroscopy time: 1 minute 38 seconds. COMPARISON:  Same day. FINDINGS: Four intraoperative fluoroscopic images were obtained. These demonstrate intramedullary rod fixation proximal left femoral intertrochanteric fracture. Improved alignment of fracture components is noted. IMPRESSION: Fluoroscopic guidance provided during intramedullary rod fixation of left femur for treatment of proximal left femoral intertrochanteric fracture. Electronically Signed   By: Marijo Conception M.D.   On: 05/07/2021 14:37   DG Hip Unilat With Pelvis 2-3 Views Left  Result Date: 05/07/2021 CLINICAL DATA:  Recent fall with left hip pain, initial encounter EXAM: DG HIP (WITH OR WITHOUT PELVIS) 3V LEFT COMPARISON:  None. FINDINGS: Pelvic ring is intact. Healed fractures are noted in the superior and inferior pubic  rami on the right. Comminuted left intratrochanteric fracture is noted with impaction and angulation at the fracture site. No dislocation is seen. IMPRESSION: Comminuted left intratrochanteric fracture as described. Old healed fractures are noted involving the pubic rami on the right. Electronically Signed   By: Inez Catalina M.D.   On: 05/07/2021 03:27   DG FEMUR PORT MIN 2 VIEWS LEFT  Result Date: 05/07/2021 CLINICAL DATA:  Post hip fracture intramedullary nailing. EXAM: LEFT FEMUR PORTABLE 2 VIEWS COMPARISON:  May 07, 2021 preoperative evaluation. FINDINGS: Post antegrade nailing of a comminuted intratrochanteric fracture that was seen previously with placement of a intramedullary rod and hip screw crossing the femoral neck. Near anatomic alignment. Distal interlocking screws are well noted as well, 2 distal interlocking screws. No unexpected findings with gas in the local soft tissues and skin staples over the LEFT hip and lower thigh. IMPRESSION: Post antegrade nailing of a comminuted intertrochanteric fracture, in near anatomic alignment and no unexpected findings. Electronically Signed   By: Zetta Bills M.D.   On: 05/07/2021 15:17    Assessment/Plan: 1 Day Post-Op   Active Problems:   Hypothyroidism   Dementia (HCC)   Traumatic intraparenchymal hemorrhage   Closed intertrochanteric fracture of left hip, initial encounter (  HCC)   CKD (chronic kidney disease) stage 3, GFR 30-59 ml/min (HCC)   Age-related osteoporosis without current pathological fracture   Essential hypertension  Patient stable post-op.  Foley catheter has been removed.  Hemoglobin and hematocrit are within acceptable limits.  Vital signs stable.  Patient will continue PT and OT.   She will return to [facility] after discharge.  Patient OK to start post-op anticoagulation per Dr. Izora Ribas.  Patient will start lovenox 30 mg daily starting today.   I answered all questions by the patient's daughter today.      Thornton Park , MD 05/08/2021, 11:48 AM

## 2021-05-08 NOTE — Consult Note (Signed)
NEUROLOGY CONSULTATION NOTE   Date of service: May 08, 2021 Patient Name: Michele Lambert MRN:  [id number] DOB:  07-20-1926 Reason for consult: IPH Requesting physician: Dr. Meade Maw _ _ _   _ __   _ __ _ _  __ __   _ __   __ _  History of Present Illness   This is a 88 yo woman with hx severe dementia (oriented only to self at baseline) and hypothyroidism who was BIB EMS from facility after a fall. Head CT on arrival showed small IPH R putamen with IVH extension (ICH score = 2 -> 26% mortality). She was seen by [facility] on arrival and HCPOA clearly stated that she would not any neurosurgical intervention if the bleed were to worsen therefore she was [facility] not [facility]. She underwent repair of hip fracture yesterday and repeat head CT afterwards showed no change in R BG IPH. On exam she is pleasantly confused, apparently at baseline which is oriented to self only. No family was at bedside today.   ROS   UTA 2/2 dementia  Past History   I have reviewed the following:  Past Medical History:  Diagnosis Date   Dementia ([place])    Hypothyroidism    Past Surgical History:  Procedure Laterality Date   ORIF HUMERUS FRACTURE Right 05/02/2018   Procedure: OPEN REDUCTION INTERNAL FIXATION (ORIF) DISTAL HUMERUS FRACTURE;  Surgeon: Hiram Gash, MD;  Location: [facility];  Service: Orthopedics;  Laterality: Right;   Family History  Problem Relation Age of Onset   Stroke Mother    Social History   Socioeconomic History   Marital status: Widowed    Spouse name: Not on file   Number of children: Not on file   Years of education: Not on file   Highest education level: Not on file  Occupational History   Not on file  Tobacco Use   Smoking status: Never   Smokeless tobacco: Never  Substance and Sexual Activity   Alcohol use: Not Currently   Drug use: Never   Sexual activity: Not Currently  Other Topics Concern   Not on file  Social History Narrative   Not on  file   Social Determinants of Health   Financial Resource Strain: Not on file  Food Insecurity: Not on file  Transportation Needs: Not on file  Physical Activity: Not on file  Stress: Not on file  Social Connections: Not on file   Allergies  Allergen Reactions   Codeine    Lescol [Fluvastatin Sodium]     Medications   Medications Prior to Admission  Medication Sig Dispense Refill Last Dose   acetaminophen (TYLENOL) 650 MG CR tablet Take 650 mg by mouth every 8 (eight) hours as needed.   prn at prn   Calcium Carb-Cholecalciferol 600-10 MG-MCG TABS Take 1 tablet by mouth 2 (two) times daily.   05/06/2021 at 2000   hydrochlorothiazide (HYDRODIURIL) 25 MG tablet Take 25 mg by mouth daily.   05/06/2021 at 0800   levothyroxine (SYNTHROID) 125 MCG tablet Take 125 mcg by mouth every morning.   05/06/2021 at 0600   losartan (COZAAR) 50 MG tablet Take 50 mg by mouth every evening.   05/06/2021 at 2000   polyethylene glycol (MIRALAX / GLYCOLAX) packet Take 17 g by mouth daily. (Patient taking differently: Take 17 g by mouth daily as needed.) 14 each 0 prn at prn   Calcium Carbonate (CALCIUM 600 PO) Take 1,200 mg  by mouth daily. (Patient not taking: Reported on 05/07/2021)   Not Taking   levothyroxine (SYNTHROID, LEVOTHROID) 88 MCG tablet Take 88 mcg by mouth daily before breakfast. (Patient not taking: Reported on 05/07/2021)   Not Taking   traZODone (DESYREL) 50 MG tablet Take 1 tablet (50 mg total) by mouth at bedtime as needed for up to 30 days for sleep. 30 tablet 0       Current Facility-Administered Medications:    acetaminophen (TYLENOL) tablet 325-650 mg, 325-650 mg, Oral, Q6H PRN, Thornton Park, MD, 650 mg at 05/08/21 1523   alum & mag hydroxide-simeth (MAALOX/MYLANTA) 200-200-20 MG/5ML suspension 30 mL, 30 mL, Oral, Q4H PRN, Thornton Park, MD   bisacodyl (DULCOLAX) suppository 10 mg, 10 mg, Rectal, Daily PRN, Thornton Park, MD   Chlorhexidine Gluconate Cloth 2 % PADS 6 each, 6 each,  Topical, Daily, Athena Masse, MD, 6 each at 05/08/21 0848   docusate sodium (COLACE) capsule 100 mg, 100 mg, Oral, BID, Thornton Park, MD, 100 mg at 05/08/21 0848   enoxaparin (LOVENOX) injection 30 mg, 30 mg, Subcutaneous, Q24H, Dallie Piles, RPH, 30 mg at 05/08/21 0848   HYDROcodone-acetaminophen (NORCO/VICODIN) 5-325 MG per tablet 1-2 tablet, 1-2 tablet, Oral, Q4H PRN, Thornton Park, MD   labetalol (NORMODYNE) injection 20 mg, 20 mg, Intravenous, Q10 min PRN, Thornton Park, MD   levothyroxine (SYNTHROID) tablet 125 mcg, 125 mcg, Oral, QAC breakfast, Thornton Park, MD   losartan (COZAAR) tablet 50 mg, 50 mg, Oral, QPM, Thornton Park, MD, 50 mg at 05/08/21 1701   menthol-cetylpyridinium (CEPACOL) lozenge 3 mg, 1 lozenge, Oral, PRN **OR** phenol (CHLORASEPTIC) mouth spray 1 spray, 1 spray, Mouth/Throat, PRN, Thornton Park, MD   methocarbamol (ROBAXIN) tablet 500 mg, 500 mg, Oral, Q6H PRN **OR** methocarbamol (ROBAXIN) 500 mg in dextrose 5 % 50 mL IVPB, 500 mg, Intravenous, Q6H PRN, Thornton Park, MD   multivitamin with minerals tablet 1 tablet, 1 tablet, Oral, Daily, Thornton Park, MD, 1 tablet at 05/08/21 0848   ondansetron (ZOFRAN) tablet 4 mg, 4 mg, Oral, Q6H PRN **OR** ondansetron (ZOFRAN) injection 4 mg, 4 mg, Intravenous, Q6H PRN, Thornton Park, MD   polyethylene glycol (MIRALAX / GLYCOLAX) packet 17 g, 17 g, Oral, Daily, Wouk, Ailene Rud, MD, 17 g at 05/08/21 1523   senna (SENOKOT) tablet 8.6 mg, 1 tablet, Oral, BID, Thornton Park, MD, 8.6 mg at 05/08/21 0848   traMADol (ULTRAM) tablet 50 mg, 50 mg, Oral, Q6H, Thornton Park, MD, 50 mg at 05/08/21 1701  Vitals   Vitals:   05/08/21 0827 05/08/21 1055 05/08/21 1515 05/08/21 1902  BP: 124/70 (!) 111/58 (!) 121/59 (!) 110/53  Pulse: 97 89 93 92  Resp: 17 17 17 17   Temp: 99.8 F (37.7 C) 99.2 F (37.3 C) (!) 100.4 F (38 C) 100.1 F (37.8 C)  TempSrc: Oral Axillary Oral Oral  SpO2: 95% 98% 97% 97%   Weight:      Height:         Body mass index is 29.45 kg/m.  Physical Exam   Physical Exam Gen: oriented to self only, follows simple commands HEENT: Atraumatic, normocephalic;mucous membranes moist; oropharynx clear, tongue without atrophy or fasciculations. Neck: Supple, trachea midline. Resp: CTAB, no w/r/r CV: RRR, no m/g/r; nml S1 and S2. 2+ symmetric peripheral pulses. Abd: soft/NT/ND; nabs x 4 quad Extrem: LLE post-op  Neuro: *MS: oriented to self only, follows simple commands *Speech: fluid, nondysarthric, impaired naming and repetition *CN: PERRL, blinks to threat bilat, EOMI, sensation  intact, mild L UMN facial droop, hearing intact to voice *Motor: 4+/5 strength diffusely without focality; LLE not examined 2/2 post-op *Sensory: SILT *Reflexes: 2+ symm throughout (LLE not tested)   Labs   CBC:  Recent Labs  Lab 05/07/21 0941 05/08/21 0430  WBC 17.3* 12.6*  NEUTROABS 14.5*  --   HGB 11.1* 8.4*  HCT 34.5* 25.7*  MCV 87.6 86.5  PLT 246 500    Basic Metabolic Panel:  Lab Results  Component Value Date   NA 136 05/08/2021   K 4.0 05/08/2021   CO2 26 05/08/2021   GLUCOSE 125 (H) 05/08/2021   BUN 28 (H) 05/08/2021   CREATININE 1.04 (H) 05/08/2021   CALCIUM 7.8 (L) 05/08/2021   GFRNONAA 50 (L) 05/08/2021   GFRAA 53 (L) 05/06/2018   Lipid Panel: No results found for: LDLCALC HgbA1c: No results found for: HGBA1C Urine Drug Screen: No results found for: LABOPIA, COCAINSCRNUR, LABBENZ, AMPHETMU, THCU, LABBARB  Alcohol Level No results found for: ETH    Impression   This is a 88 yo woman with hx severe dementia (oriented only to self at baseline) and hypothyroidism who was BIB EMS from facility after a fall. She has a small R BG IPH which appears hypertensive although she has no prior hx HTN. It has been stable on multiple CT scans over the past 3 days and she has been restarted on DVT prophylaxis. No further neurologic workup indicated as inpatient;  recommendations as follows:  Recommendations   - SBP goal <150 - OK to continue DVT prophylaxis - STAT head CT for any change in exam - If she develops discrete spells of abnormal motor movements, staring, or decreased responsiveness please reconsult neurology for EEG. Otherwise neurology will be available prn for questions going forward ______________________________________________________________________   Thank you for the opportunity to take part in the care of this patient. If you have any further questions, please contact the neurology consultation attending.  Signed,  Su Monks, MD [facility] [phone]  If 7pm- 7am, please page neurology on call as listed in [place].

## 2021-05-08 NOTE — TOC Initial Note (Signed)
Transition of Care [facility]) - Initial/Assessment Note    Patient Details  Name: Michele Lambert MRN: [id number] Date of Birth: 1926-10-22  Transition of [facility] CM/SW Contact:    Alberteen Sam, LCSW Phone Number: 05/08/2021, 10:34 AM  Clinical Narrative:                  CSW notes PT recs are SNF at time of discharge, patient is from [facility] and oriented to self only.   CSW has called patient's daughter Raquel Sarna to discuss discharge planning and PT recs, no answer, left voicemail. Pending call back at this time.    Expected Discharge Plan: Skilled Nursing Facility Barriers to Discharge: Continued Medical Work up   Patient Goals and CMS Choice Patient states their goals for this hospitalization and ongoing recovery are:: to go home CMS Medicare.gov Compare Post Acute Care list provided to:: Patient Choice offered to / list presented to : Adult Children (daughter Raquel Sarna)  Expected Discharge Plan and Services Expected Discharge Plan: [place] arrangements for the past 2 months:  ([facility] [place])                                      Prior Living Arrangements/Services Living arrangements for the past 2 months:  ([facility] [place]) Lives with:: Self, Facility Resident                   Activities of Daily Living Home Assistive Devices/Equipment: Environmental consultant (specify type) ADL Screening (condition at time of admission) Patient's cognitive ability adequate to safely complete daily activities?: Yes Is the patient deaf or have difficulty hearing?: No Does the patient have difficulty seeing, even when wearing glasses/contacts?: No Does the patient have difficulty concentrating, remembering, or making decisions?: Yes Patient able to express need for assistance with ADLs?: Yes Does the patient have difficulty dressing or bathing?: Yes Independently performs ADLs?: No Communication: Dependent Is this a change from baseline?:  Change from baseline, expected to last <3 days Does the patient have difficulty walking or climbing stairs?: Yes Weakness of Legs: Both Weakness of Arms/Hands: Both  Permission Sought/Granted                  Emotional Assessment       Orientation: : Oriented to Self Alcohol / Substance Use: Not Applicable Psych Involvement: No (comment)  Admission diagnosis:  Hip fracture ([place]) [S72.009A] Surgery, elective [Z41.9] Basal ganglia hemorrhage ([place]) [I61.0] Closed fracture of left hip, initial encounter ([place]) [S72.002A] Fall, initial encounter [W19.XXXA] Closed nondisplaced fracture of left clavicle, unspecified part of clavicle, initial encounter [S42.002A] Patient Active Problem List   Diagnosis Date Noted   Traumatic intraparenchymal hemorrhage 05/07/2021   Closed intertrochanteric fracture of left hip, initial encounter ([place]) 05/07/2021   Age-related osteoporosis without current pathological fracture 05/07/2021   Essential hypertension 05/07/2021   CKD (chronic kidney disease) stage 3, GFR 30-59 ml/min ([place]) 10/01/2019   Fracture of distal humerus 05/03/2018   Displaced fracture of right humerus 05/01/2018   Hypothyroidism 05/01/2018   Dementia ([place]) 05/01/2018   PCP:  Dion Body, MD Pharmacy:   [facility] [place], [place] [address] [place] [address] Phone: [phone] Fax: [phone]     Social Determinants of Health (SDOH) Interventions  Readmission Risk Interventions No flowsheet data found.

## 2021-05-09 LAB — BASIC METABOLIC PANEL
Anion gap: 6 (ref 5–15)
BUN: 33 mg/dL — ABNORMAL HIGH (ref 8–23)
CO2: 26 mmol/L (ref 22–32)
Calcium: 7.9 mg/dL — ABNORMAL LOW (ref 8.9–10.3)
Chloride: 106 mmol/L (ref 98–111)
Creatinine, Ser: 1.1 mg/dL — ABNORMAL HIGH (ref 0.44–1.00)
GFR, Estimated: 47 mL/min — ABNORMAL LOW (ref >60)
Glucose, Bld: 100 mg/dL — ABNORMAL HIGH (ref 70–99)
Potassium: 4 mmol/L (ref 3.5–5.1)
Sodium: 138 mmol/L (ref 135–145)

## 2021-05-09 LAB — CBC
HCT: 24.6 % — ABNORMAL LOW (ref 36.0–46.0)
Hemoglobin: 8 g/dL — ABNORMAL LOW (ref 12.0–15.0)
MCH: 28.5 pg (ref 26.0–34.0)
MCHC: 32.5 g/dL (ref 30.0–36.0)
MCV: 87.5 fL (ref 80.0–100.0)
Platelets: 189 10*3/uL (ref 150–400)
RBC: 2.81 MIL/uL — ABNORMAL LOW (ref 3.87–5.11)
RDW: 15.1 % (ref 11.5–15.5)
WBC: 12.2 10*3/uL — ABNORMAL HIGH (ref 4.0–10.5)
nRBC: 0 % (ref 0.0–0.2)

## 2021-05-09 MED ORDER — OXYCODONE HCL 5 MG PO TABS: 5.0000 mg | ORAL_TABLET | Freq: Four times a day (QID) | ORAL | Status: DC | PRN

## 2021-05-09 MED ORDER — SODIUM CHLORIDE 0.9 % IV SOLN: INTRAVENOUS | Status: AC

## 2021-05-09 MED ORDER — TRAMADOL HCL 50 MG PO TABS: 50.0000 mg | ORAL_TABLET | Freq: Four times a day (QID) | ORAL | Status: DC | PRN

## 2021-05-09 MED ORDER — ACETAMINOPHEN 325 MG PO TABS
650.0000 mg | ORAL_TABLET | Freq: Three times a day (TID) | ORAL | Status: DC
  Administered 2021-05-09 – 2021-05-11 (×4): 650 mg via ORAL
  Filled 2021-05-09 (×5): qty 2

## 2021-05-09 NOTE — Plan of Care (Signed)
°  Problem: Clinical Measurements: Goal: Will remain free from infection Outcome: Progressing   Problem: Clinical Measurements: Goal: Respiratory complications will improve Outcome: Progressing   Problem: Clinical Measurements: Goal: Cardiovascular complication will be avoided Outcome: Progressing   Problem: Elimination: Goal: Will not experience complications related to urinary retention Outcome: Progressing   Problem: Pain Managment: Goal: General experience of comfort will improve Outcome: Progressing   Problem: Safety: Goal: Ability to remain free from injury will improve Outcome: Progressing

## 2021-05-09 NOTE — Progress Notes (Signed)
Physical Therapy Treatment Patient Details Name: Michele Lambert MRN: [id number] DOB: 1926-07-29 Today's Date: 05/09/2021   History of Present Illness Pt is a 88 y/o F admitted on 05/07/21 with c/c of a fall. Hip x-ray with comminuted left intertrochanteric fracture, CT head with intraparenchymal hemorrhage in the right basal ganglia without evidence of hydrocephalus, significant mass effect or midline shift. Pt underwent L IM fixation of L hip fx on 05/07/21 by Dr. Mack Guise & is WBAT. Neurosurgery was consulted for R putamen IPH with IVH but family has stated they don't want surgical intervention for the bleed if pt were to deteriorate. PMH: dementia, HTN, hypothyroidism    PT Comments    Pt in bed with eyes closed.  She does respond initially but by end of session stops answering questions.  She is cued to assist with LE AROM but does not seem to put in any assist to help.  Encouraged sitting EOB but again makes no initiation of movement despite heavy cues and assist.  Effort was stopped for pt and staff safety.     Recommendations for follow up therapy are one component of a multi-disciplinary discharge planning process, led by the attending physician.  Recommendations may be updated based on patient status, additional functional criteria and insurance authorization.  Follow Up Recommendations  Skilled nursing-short term rehab (<3 hours/day)     Assistance Recommended at Discharge    Patient can return home with the following Two people to help with walking and/or transfers;Two people to help with bathing/dressing/bathroom;Direct supervision/assist for medications management;Help with stairs or ramp for entrance;Assistance with feeding;Assist for transportation;Direct supervision/assist for financial management;Assistance with cooking/housework   Equipment Recommendations  None recommended by PT    Recommendations for Other Services       Precautions / Restrictions Precautions Precautions:  Fall Restrictions Weight Bearing Restrictions: Yes LLE Weight Bearing: Weight bearing as tolerated     Mobility  Bed Mobility               General bed mobility comments: deferred as she did not help at all when cued and given assist.  keeps eyes closed    Transfers                        Ambulation/Gait                   Stairs             Wheelchair Mobility    Modified Rankin (Stroke Patients Only)       Balance                                            Cognition Arousal/Alertness: Lethargic Behavior During Therapy: Flat affect Overall Cognitive Status: No family/caregiver present to determine baseline cognitive functioning                                 General Comments: keeps eyes closed during session initially answering questions approprieatly but then stops conversation        Exercises Other Exercises Other Exercises: supine PROM x 10 BLE- pt makes no attempt to help    General Comments        Pertinent Vitals/Pain Pain Assessment Pain Assessment: Faces Faces Pain Scale: Hurts a little bit  Pain Descriptors / Indicators: Grimacing Pain Intervention(s): Limited activity within patient's tolerance, Monitored during session, Repositioned    Home Living                          Prior Function            PT Goals (current goals can now be found in the care plan section) Progress towards PT goals: Not progressing toward goals - comment    Frequency    7X/week      PT Plan Current plan remains appropriate    Co-evaluation              AM-PAC PT "6 Clicks" Mobility   Outcome Measure  Help needed turning from your back to your side while in a flat bed without using bedrails?: Total Help needed moving from lying on your back to sitting on the side of a flat bed without using bedrails?: Total Help needed moving to and from a bed to a chair (including a  wheelchair)?: Total Help needed standing up from a chair using your arms (e.g., wheelchair or bedside chair)?: Total Help needed to walk in hospital room?: Total Help needed climbing 3-5 steps with a railing? : Total 6 Click Score: 6    End of Session   Activity Tolerance: Patient tolerated treatment well Patient left: in bed;with call bell/phone within reach;with bed alarm set;with SCD's reapplied Nurse Communication: Mobility status;Weight bearing status;Precautions PT Visit Diagnosis: Unsteadiness on feet (R26.81);Muscle weakness (generalized) (M62.81);Difficulty in walking, not elsewhere classified (R26.2)     Time: 1209-1218 PT Time Calculation (min) (ACUTE ONLY): 9 min  Charges:  $Therapeutic Exercise: 8-22 mins                    Chesley Noon, PTA 05/09/21, 1:40 PM

## 2021-05-09 NOTE — Progress Notes (Addendum)
PROGRESS NOTE    SHAWNITA KRIZEK  YJE:[id number] DOB: Dec 29, 1926 [name]: 05/07/2021 PCP: Dion Body, MD  Outpatient Specialists: none    Brief Narrative:   Hx dementia, htn, hypothyroid, presented AM of 2/3 after unwitnessed fall at her nursing home. Staff heard a "thud" and found the patient on the floor complaining of left hip pain. At baseline ambulates with a walker. Found to have left intratrochanteric fracture as well as intraparenchymal hemorrhage on CT of brain.   Assessment & Plan:   Active Problems:   Hypothyroidism   Dementia ([place])   Traumatic intraparenchymal hemorrhage   Closed intertrochanteric fracture of left hip, initial encounter (HCC)   CKD (chronic kidney disease) stage 3, GFR 30-59 ml/min (HCC)   Age-related osteoporosis without current pathological fracture   Essential hypertension  # Left comminuted intratrochanteric fracture S/p intramedullary fixation on 2/3 w/ ortho. Hgb 8. pain controlled. - neurosurg ok with lovenox for ppx - plan for skilled nursing at d/c - monitor hgb, transfuse if drops below 8 - pain control, bowel regimen. Stop standing tramadol, start prn  # Traumatic intraparenchymal hemorrhage CT shows intraparenchymal hemorrhage in right basal ganglia w/ intraventricular extension w/o evidence hydrocephalus, mass effect, or midline shift. Neurosurgery and neurology consulted. Relatively stable on repeat CT. Goal normotension. No indication for neurosurg intervention and family says they would likely decline - neurosurg and neurology have signed off - seizure precautions - maintain sbp below 150 - cleared for dysphagia diet by SLP  # Urinary retention, post-op Retained overnight 2/4, required I/o cath x1 - bladder scan q shift, I/o cath prn for now  # Alzheimer's dementia Baseline is ambulatory with walker but unsteady on feet. Very poor memory. No behavioral disturbances.  # Hypothyroid - home synthroid  # HTN Here bp wnl -  cont home losartan,  - home hctz on hold   DVT prophylaxis: lovenox Code Status: DNR Family Communication: son and daughter updated @ bedside  Level of care: Progressive Status is: Inpatient Remains inpatient appropriate because: severity of illness    Consultants:  Ortho, neuro, neurosurg  Procedures: ORIF  Antimicrobials:  none    Subjective: Asleep  Objective: Vitals:   05/09/21 0035 05/09/21 0404 05/09/21 0834 05/09/21 1208  BP: 117/69 119/69 138/69 133/66  Pulse: 82 86 91 88  Resp: 18 18 17 18   Temp: 98.6 F (37 C)  98.5 F (36.9 C) 98.1 F (36.7 C)  TempSrc:   Oral   SpO2: 92% 96% 95% 91%  Weight:      Height:        Intake/Output Summary (Last 24 hours) at 05/09/2021 1430 Last data filed at 05/09/2021 1042 Gross per 24 hour  Intake --  Output 1000 ml  Net -1000 ml   Filed Weights   05/07/21 0230  Weight: 68.4 kg    Examination:  General exam: Appears calm and comfortable  Respiratory system: normal WOB Cardiovascular system: S1 & S2 heard, RRR. No JVD, murmurs, rubs, gallops or clicks.  Gastrointestinal system: Abdomen is nondistended, soft and nontender Central nervous system: moves all 4 ext Extremities: warm, palpable DPs Skin: dressings left hip clean and dry Psychiatry: calm,     Data Reviewed: I have personally reviewed following labs and imaging studies  CBC: Recent Labs  Lab 05/07/21 0941 05/08/21 0430 05/09/21 0529  WBC 17.3* 12.6* 12.2*  NEUTROABS 14.5*  --   --   HGB 11.1* 8.4* 8.0*  HCT 34.5* 25.7* 24.6*  MCV 87.6 86.5 87.5  PLT 246 171 915   Basic Metabolic Panel: Recent Labs  Lab 05/07/21 0941 05/08/21 0430 05/09/21 0529  NA 135 136 138  K 3.9 4.0 4.0  CL 99 104 106  CO2 28 26 26   GLUCOSE 118* 125* 100*  BUN 29* 28* 33*  CREATININE 0.97 1.04* 1.10*  CALCIUM 8.8* 7.8* 7.9*   GFR: Estimated Creatinine Clearance: 28.2 mL/min (A) (by C-G formula based on SCr of 1.1 mg/dL (H)). Liver Function  Tests: Recent Labs  Lab 05/07/21 0941  AST 29  ALT 15  ALKPHOS 53  BILITOT 0.6  PROT 6.4*  ALBUMIN 3.5   No results for input(s): LIPASE, AMYLASE in the last 168 hours. No results for input(s): AMMONIA in the last 168 hours. Coagulation Profile: No results for input(s): INR, PROTIME in the last 168 hours. Cardiac Enzymes: No results for input(s): CKTOTAL, CKMB, CKMBINDEX, TROPONINI in the last 168 hours. BNP (last 3 results) No results for input(s): PROBNP in the last 8760 hours. HbA1C: No results for input(s): HGBA1C in the last 72 hours. CBG: No results for input(s): GLUCAP in the last 168 hours. Lipid Profile: No results for input(s): CHOL, HDL, LDLCALC, TRIG, CHOLHDL, LDLDIRECT in the last 72 hours. Thyroid Function Tests: No results for input(s): TSH, T4TOTAL, FREET4, T3FREE, THYROIDAB in the last 72 hours. Anemia Panel: No results for input(s): VITAMINB12, FOLATE, FERRITIN, TIBC, IRON, RETICCTPCT in the last 72 hours. Urine analysis:    Component Value Date/Time   COLORURINE STRAW (A) 08/07/2020 1528   APPEARANCEUR CLEAR (A) 08/07/2020 1528   LABSPEC 1.005 08/07/2020 1528   PHURINE 8.0 08/07/2020 1528   GLUCOSEU NEGATIVE 08/07/2020 1528   HGBUR NEGATIVE 08/07/2020 1528   BILIRUBINUR NEGATIVE 08/07/2020 1528   KETONESUR NEGATIVE 08/07/2020 1528   PROTEINUR NEGATIVE 08/07/2020 1528   NITRITE NEGATIVE 08/07/2020 1528   LEUKOCYTESUR NEGATIVE 08/07/2020 1528   Sepsis Labs: @LABRCNTIP (procalcitonin:4,lacticidven:4)  ) Recent Results (from the past 240 hour(s))  Resp Panel by RT-PCR (Flu A&B, Covid) Nasopharyngeal Swab     Status: None   Collection Time: 05/07/21  3:26 AM   Specimen: Nasopharyngeal Swab; Nasopharyngeal(NP) swabs in vial transport medium  Result Value Ref Range Status   SARS Coronavirus 2 by RT PCR NEGATIVE NEGATIVE Final    Comment: (NOTE) SARS-CoV-2 target nucleic acids are NOT DETECTED.  The SARS-CoV-2 RNA is generally detectable in upper  respiratory specimens during the acute phase of infection. The lowest concentration of SARS-CoV-2 viral copies this assay can detect is 138 copies/mL. A negative result does not preclude SARS-Cov-2 infection and should not be used as the sole basis for treatment or other patient management decisions. A negative result may occur with  improper specimen collection/handling, submission of specimen other than nasopharyngeal swab, presence of viral mutation(s) within the areas targeted by this assay, and inadequate number of viral copies(<138 copies/mL). A negative result must be combined with clinical observations, patient history, and epidemiological information. The expected result is Negative.  Fact Sheet for Patients:  EntrepreneurPulse.com.au  Fact Sheet for Healthcare Providers:  IncredibleEmployment.be  This test is no t yet approved or cleared by the [place] FDA and  has been authorized for detection and/or diagnosis of SARS-CoV-2 by FDA under an Emergency Use Authorization (EUA). This EUA will remain  in effect (meaning this test can be used) for the duration of the COVID-19 declaration under Section 564(b)(1) of the Act, 21 U.S.C.section 360bbb-3(b)(1), unless the authorization is terminated  or revoked sooner.  Influenza A by PCR NEGATIVE NEGATIVE Final   Influenza B by PCR NEGATIVE NEGATIVE Final    Comment: (NOTE) The Xpert Xpress SARS-CoV-2/FLU/RSV plus assay is intended as an aid in the diagnosis of influenza from Nasopharyngeal swab specimens and should not be used as a sole basis for treatment. Nasal washings and aspirates are unacceptable for Xpert Xpress SARS-CoV-2/FLU/RSV testing.  Fact Sheet for Patients: EntrepreneurPulse.com.au  Fact Sheet for Healthcare Providers: IncredibleEmployment.be  This test is not yet approved or cleared by the [place] FDA and has been  authorized for detection and/or diagnosis of SARS-CoV-2 by FDA under an Emergency Use Authorization (EUA). This EUA will remain in effect (meaning this test can be used) for the duration of the COVID-19 declaration under Section 564(b)(1) of the Act, 21 U.S.C. section 360bbb-3(b)(1), unless the authorization is terminated or revoked.  Performed at [facility], [address] [place], [place] [address]          Radiology Studies: CT HEAD WO CONTRAST (5MM)  Result Date: 05/07/2021 CLINICAL DATA:  Intraparenchymal hemorrhage. EXAM: CT HEAD WITHOUT CONTRAST TECHNIQUE: Contiguous axial images were obtained from the base of the skull through the vertex without intravenous contrast. RADIATION DOSE REDUCTION: This exam was performed according to the departmental dose-optimization program which includes automated exposure control, adjustment of the mA and/or kV according to patient size and/or use of iterative reconstruction technique. COMPARISON:  Of same day. FINDINGS: Brain: Grossly stable size and appearance of right basal ganglia hemorrhage with hemorrhage present in bilateral lateral and third ventricles as well. Stable ventricular size is noted. Cortical atrophy is again noted. No mass effect or midline shift is noted. No new hemorrhage is noted. Vascular: No hyperdense vessel or unexpected calcification. Skull: Normal. Negative for fracture or focal lesion. Sinuses/Orbits: No acute finding. Other: None. IMPRESSION: Stable size and appearance of right basal ganglia intraparenchymal hemorrhage as well as intraventricular hemorrhage involving both lateral ventricles and third ventricle. Electronically Signed   By: Marijo Conception M.D.   On: 05/07/2021 16:01   DG FEMUR PORT MIN 2 VIEWS LEFT  Result Date: 05/07/2021 CLINICAL DATA:  Post hip fracture intramedullary nailing. EXAM: LEFT FEMUR PORTABLE 2 VIEWS COMPARISON:  May 07, 2021 preoperative evaluation. FINDINGS: Post antegrade  nailing of a comminuted intratrochanteric fracture that was seen previously with placement of a intramedullary rod and hip screw crossing the femoral neck. Near anatomic alignment. Distal interlocking screws are well noted as well, 2 distal interlocking screws. No unexpected findings with gas in the local soft tissues and skin staples over the LEFT hip and lower thigh. IMPRESSION: Post antegrade nailing of a comminuted intertrochanteric fracture, in near anatomic alignment and no unexpected findings. Electronically Signed   By: Zetta Bills M.D.   On: 05/07/2021 15:17        Scheduled Meds:  Chlorhexidine Gluconate Cloth  6 each Topical Daily   docusate sodium  100 mg Oral BID   enoxaparin (LOVENOX) injection  30 mg Subcutaneous Q24H   levothyroxine  125 mcg Oral QAC breakfast   losartan  50 mg Oral QPM   multivitamin with minerals  1 tablet Oral Daily   polyethylene glycol  17 g Oral Daily   senna  1 tablet Oral BID   Continuous Infusions:  methocarbamol (ROBAXIN) IV       LOS: 2 days    Time spent: 25 min    Desma Maxim, MD [facility]   If 7PM-7AM, please contact night-coverage [email or web address] Password  [email or web address] 05/09/2021, 2:30 PM

## 2021-05-09 NOTE — Progress Notes (Signed)
Cross Cover Urinary retention requiring in and out cathl

## 2021-05-09 NOTE — Progress Notes (Signed)
Subjective:  Patient reports pain as mild.  Opens eyes and follows commands.  Objective:   VITALS:   Vitals:   05/08/21 1902 05/09/21 0035 05/09/21 0404 05/09/21 0834  BP: (!) 110/53 117/69 119/69 138/69  Pulse: 92 82 86 91  Resp: 17 18 18 17   Temp: 100.1 F (37.8 C) 98.6 F (37 C)  98.5 F (36.9 C)  TempSrc: Oral   Oral  SpO2: 97% 92% 96% 95%  Weight:      Height:        PHYSICAL EXAM:  Sensation intact distally Dorsiflexion/Plantar flexion intact Incision: dressing C/D/I No cellulitis present Compartment soft  LABS  Results for orders placed or performed during the hospital encounter of 05/07/21 (from the past 24 hour(s))  CBC     Status: Abnormal   Collection Time: 05/09/21  5:29 AM  Result Value Ref Range   WBC 12.2 (H) 4.0 - 10.5 K/uL   RBC 2.81 (L) 3.87 - 5.11 MIL/uL   Hemoglobin 8.0 (L) 12.0 - 15.0 g/dL   HCT 24.6 (L) 36.0 - 46.0 %   MCV 87.5 80.0 - 100.0 fL   MCH 28.5 26.0 - 34.0 pg   MCHC 32.5 30.0 - 36.0 g/dL   RDW 15.1 11.5 - 15.5 %   Platelets 189 150 - 400 K/uL   nRBC 0.0 0.0 - 0.2 %  Basic metabolic panel     Status: Abnormal   Collection Time: 05/09/21  5:29 AM  Result Value Ref Range   Sodium 138 135 - 145 mmol/L   Potassium 4.0 3.5 - 5.1 mmol/L   Chloride 106 98 - 111 mmol/L   CO2 26 22 - 32 mmol/L   Glucose, Bld 100 (H) 70 - 99 mg/dL   BUN 33 (H) 8 - 23 mg/dL   Creatinine, Ser 1.10 (H) 0.44 - 1.00 mg/dL   Calcium 7.9 (L) 8.9 - 10.3 mg/dL   GFR, Estimated 47 (L) >60 mL/min   Anion gap 6 5 - 15    CT HEAD WO CONTRAST (5MM)  Result Date: 05/07/2021 CLINICAL DATA:  Intraparenchymal hemorrhage. EXAM: CT HEAD WITHOUT CONTRAST TECHNIQUE: Contiguous axial images were obtained from the base of the skull through the vertex without intravenous contrast. RADIATION DOSE REDUCTION: This exam was performed according to the departmental dose-optimization program which includes automated exposure control, adjustment of the mA and/or kV according to  patient size and/or use of iterative reconstruction technique. COMPARISON:  Of same day. FINDINGS: Brain: Grossly stable size and appearance of right basal ganglia hemorrhage with hemorrhage present in bilateral lateral and third ventricles as well. Stable ventricular size is noted. Cortical atrophy is again noted. No mass effect or midline shift is noted. No new hemorrhage is noted. Vascular: No hyperdense vessel or unexpected calcification. Skull: Normal. Negative for fracture or focal lesion. Sinuses/Orbits: No acute finding. Other: None. IMPRESSION: Stable size and appearance of right basal ganglia intraparenchymal hemorrhage as well as intraventricular hemorrhage involving both lateral ventricles and third ventricle. Electronically Signed   By: Marijo Conception M.D.   On: 05/07/2021 16:01   DG C-Arm 1-60 Min-No Report  Result Date: 05/07/2021 CLINICAL DATA:  Left hip fracture. EXAM: DG HIP (WITH OR WITHOUT PELVIS) 2-3V LEFT; DG C-ARM 1-60 MIN-NO REPORT Fluoroscopy time: 1 minute 38 seconds. COMPARISON:  Same day. FINDINGS: Four intraoperative fluoroscopic images were obtained. These demonstrate intramedullary rod fixation proximal left femoral intertrochanteric fracture. Improved alignment of fracture components is noted. IMPRESSION: Fluoroscopic guidance provided during intramedullary rod  fixation of left femur for treatment of proximal left femoral intertrochanteric fracture. Electronically Signed   By: Marijo Conception M.D.   On: 05/07/2021 14:37   DG HIP UNILAT WITH PELVIS 2-3 VIEWS LEFT  Result Date: 05/07/2021 CLINICAL DATA:  Left hip fracture. EXAM: DG HIP (WITH OR WITHOUT PELVIS) 2-3V LEFT; DG C-ARM 1-60 MIN-NO REPORT Fluoroscopy time: 1 minute 38 seconds. COMPARISON:  Same day. FINDINGS: Four intraoperative fluoroscopic images were obtained. These demonstrate intramedullary rod fixation proximal left femoral intertrochanteric fracture. Improved alignment of fracture components is noted. IMPRESSION:  Fluoroscopic guidance provided during intramedullary rod fixation of left femur for treatment of proximal left femoral intertrochanteric fracture. Electronically Signed   By: Marijo Conception M.D.   On: 05/07/2021 14:37   DG FEMUR PORT MIN 2 VIEWS LEFT  Result Date: 05/07/2021 CLINICAL DATA:  Post hip fracture intramedullary nailing. EXAM: LEFT FEMUR PORTABLE 2 VIEWS COMPARISON:  May 07, 2021 preoperative evaluation. FINDINGS: Post antegrade nailing of a comminuted intratrochanteric fracture that was seen previously with placement of a intramedullary rod and hip screw crossing the femoral neck. Near anatomic alignment. Distal interlocking screws are well noted as well, 2 distal interlocking screws. No unexpected findings with gas in the local soft tissues and skin staples over the LEFT hip and lower thigh. IMPRESSION: Post antegrade nailing of a comminuted intertrochanteric fracture, in near anatomic alignment and no unexpected findings. Electronically Signed   By: Zetta Bills M.D.   On: 05/07/2021 15:17    Assessment/Plan: 2 Days Post-Op   Active Problems:   Hypothyroidism   Dementia ([place])   Traumatic intraparenchymal hemorrhage   Closed intertrochanteric fracture of left hip, initial encounter (HCC)   CKD (chronic kidney disease) stage 3, GFR 30-59 ml/min (HCC)   Age-related osteoporosis without current pathological fracture   Essential hypertension   Up with therapy  She will return to [facility] after discharge.    Lovenox for DVT prophylaxis  Lovell Sheehan , MD 05/09/2021, 11:38 AM

## 2021-05-10 ENCOUNTER — Encounter: Payer: Self-pay | Admitting: Orthopedic Surgery

## 2021-05-10 LAB — RESP PANEL BY RT-PCR (FLU A&B, COVID) ARPGX2
Influenza A by PCR: NEGATIVE
Influenza B by PCR: NEGATIVE
SARS Coronavirus 2 by RT PCR: NEGATIVE

## 2021-05-10 LAB — BASIC METABOLIC PANEL
Anion gap: 4 — ABNORMAL LOW (ref 5–15)
BUN: 33 mg/dL — ABNORMAL HIGH (ref 8–23)
CO2: 26 mmol/L (ref 22–32)
Calcium: 7.9 mg/dL — ABNORMAL LOW (ref 8.9–10.3)
Chloride: 109 mmol/L (ref 98–111)
Creatinine, Ser: 0.93 mg/dL (ref 0.44–1.00)
GFR, Estimated: 58 mL/min — ABNORMAL LOW (ref >60)
Glucose, Bld: 120 mg/dL — ABNORMAL HIGH (ref 70–99)
Potassium: 3.9 mmol/L (ref 3.5–5.1)
Sodium: 139 mmol/L (ref 135–145)

## 2021-05-10 LAB — CBC
HCT: 23.1 % — ABNORMAL LOW (ref 36.0–46.0)
Hemoglobin: 7.3 g/dL — ABNORMAL LOW (ref 12.0–15.0)
MCH: 28.3 pg (ref 26.0–34.0)
MCHC: 31.6 g/dL (ref 30.0–36.0)
MCV: 89.5 fL (ref 80.0–100.0)
Platelets: 207 10*3/uL (ref 150–400)
RBC: 2.58 MIL/uL — ABNORMAL LOW (ref 3.87–5.11)
RDW: 14.8 % (ref 11.5–15.5)
WBC: 14.3 10*3/uL — ABNORMAL HIGH (ref 4.0–10.5)
nRBC: 0 % (ref 0.0–0.2)

## 2021-05-10 LAB — HEMOGLOBIN AND HEMATOCRIT, BLOOD
HCT: 25.6 % — ABNORMAL LOW (ref 36.0–46.0)
Hemoglobin: 8.6 g/dL — ABNORMAL LOW (ref 12.0–15.0)

## 2021-05-10 LAB — PREPARE RBC (CROSSMATCH)

## 2021-05-10 MED ORDER — SODIUM CHLORIDE 0.9% IV SOLUTION: Freq: Once | INTRAVENOUS | Status: AC

## 2021-05-10 NOTE — TOC Progression Note (Signed)
Transition of Care [facility]) - Progression Note    Patient Details  Name: Michele Lambert MRN: [id number] Date of Birth: 1926-08-11  Transition of Care [facility]) CM/SW [place], [place] Phone Number: 05/10/2021, 10:09 AM  Clinical Narrative:     CSW spoke with Delevan at [phone] they report patient is in their Memory Care unit and can return upon discharge.   Updated clinicals to be faxed to [phone].   At time of discharge, RN to call report to [phone].   They report if patient is able to sit then Laurie can provide transportation at time of discharge, however if not then can utilize non emergent EMS.    Expected Discharge Plan: [place] Barriers to Discharge: Continued Medical Work up  Expected Discharge Plan and Services Expected Discharge Plan: [place]       Living arrangements for the past 2 months:  (home place [place])                                       Social Determinants of Health (SDOH) Interventions    Readmission Risk Interventions No flowsheet data found.

## 2021-05-10 NOTE — Anesthesia Postprocedure Evaluation (Signed)
Anesthesia Post Note  Patient: Michele Lambert  Procedure(s) Performed: INTRAMEDULLARY (IM) NAIL INTERTROCHANTRIC (Left: Hip)  Patient location during evaluation: PACU Anesthesia Type: General Level of consciousness: awake and alert Pain management: pain level controlled Vital Signs Assessment: post-procedure vital signs reviewed and stable Respiratory status: spontaneous breathing, nonlabored ventilation, respiratory function stable and patient connected to nasal cannula oxygen Cardiovascular status: blood pressure returned to baseline and stable Postop Assessment: no apparent nausea or vomiting Anesthetic complications: no   No notable events documented.   Last Vitals:  Vitals:   05/10/21 0701 05/10/21 0801  BP: (!) 134/58 (!) 158/68  Pulse: 88 85  Resp: 18 20  Temp: 37.3 C 36.8 C  SpO2: 92% 94%    Last Pain:  Vitals:   05/10/21 0701  TempSrc: Oral  PainSc:                  Precious Haws Trana Ressler

## 2021-05-10 NOTE — Progress Notes (Addendum)
Pt hgb this morning is at 7.3 from 8.0 yesterday. NP Foust made aware. Will continue to monitor.  Update 0601: NP Foust placed order. Will continue to monitor.  Update 0646: Pt blood transfusion started. Pt tolerating well. Will continue to monitor.

## 2021-05-10 NOTE — Progress Notes (Signed)
Physical Therapy Treatment Patient Details Name: Michele Lambert MRN: [id number] DOB: 07/22/1926 Today's Date: 05/10/2021   History of Present Illness Pt is a 88 y/o F admitted on 05/07/21 with c/c of a fall. Hip x-ray with comminuted left intertrochanteric fracture, CT head with intraparenchymal hemorrhage in the right basal ganglia without evidence of hydrocephalus, significant mass effect or midline shift. Pt underwent L IM fixation of L hip fx on 05/07/21 by Dr. Mack Guise & is WBAT. Neurosurgery was consulted for R putamen IPH with IVH but family has stated they don't want surgical intervention for the bleed if pt were to deteriorate. PMH: dementia, HTN, hypothyroidism    PT Comments    Pt completed blood transfusion this am.  Pt awakens and does smile and laugh some during session but remains quite fatigued.  She does tolerate AAROM BLE with increased tolerance for ROM and does assist some with activity today.  Lab in at end of session for blood draw.  Given continued lethargy and inability to assist with transition to sitting, it is deferred at this time.  Hope to be able to progress tomorrow but she may be [facility] lift appropriate at this time for transfers.  Per TOC notes she will return to [facility] upon discharge.  She will need EMS transport at this time as she has not been able to tolerate transfer or sitting for any period of time that would be needed for their facility transport.  Discussed with TOC.   Recommendations for follow up therapy are one component of a multi-disciplinary discharge planning process, led by the attending physician.  Recommendations may be updated based on patient status, additional functional criteria and insurance authorization.  Follow Up Recommendations  Skilled nursing-short term rehab (<3 hours/day)     Assistance Recommended at Discharge Frequent or constant Supervision/Assistance  Patient can return home with the following Two people to help with walking  and/or transfers;Two people to help with bathing/dressing/bathroom;Direct supervision/assist for medications management;Help with stairs or ramp for entrance;Assistance with feeding;Assist for transportation;Direct supervision/assist for financial management;Assistance with cooking/housework   Equipment Recommendations  None recommended by PT    Recommendations for Other Services       Precautions / Restrictions Precautions Precautions: Fall Restrictions Weight Bearing Restrictions: Yes LLE Weight Bearing: Weight bearing as tolerated     Mobility  Bed Mobility                    Transfers                        Ambulation/Gait                   Stairs             Wheelchair Mobility    Modified Rankin (Stroke Patients Only)       Balance                                            Cognition Arousal/Alertness: Lethargic Behavior During Therapy: WFL for tasks assessed/performed, Flat affect Overall Cognitive Status: No family/caregiver present to determine baseline cognitive functioning                                 General Comments: does open eyes with cues  today and laughs/smiles on occasion with conversation        Exercises Other Exercises Other Exercises: supine AAROM. - does not resist today like yesterday and does seem to help minimally with ex.    General Comments        Pertinent Vitals/Pain Pain Assessment Pain Assessment: Faces Faces Pain Scale: Hurts a little bit Pain Location: L hip with PT performing PROM Pain Descriptors / Indicators: Grimacing    Home Living                          Prior Function            PT Goals (current goals can now be found in the care plan section) Progress towards PT goals: Progressing toward goals    Frequency    7X/week      PT Plan Current plan remains appropriate    Co-evaluation              AM-PAC PT "6  Clicks" Mobility   Outcome Measure  Help needed turning from your back to your side while in a flat bed without using bedrails?: Total Help needed moving from lying on your back to sitting on the side of a flat bed without using bedrails?: Total Help needed moving to and from a bed to a chair (including a wheelchair)?: Total Help needed standing up from a chair using your arms (e.g., wheelchair or bedside chair)?: Total Help needed to walk in hospital room?: Total Help needed climbing 3-5 steps with a railing? : Total 6 Click Score: 6    End of Session Equipment Utilized During Treatment: Gait belt Activity Tolerance: Patient tolerated treatment well Patient left: in bed;with call bell/phone within reach;with bed alarm set;with SCD's reapplied;with family/visitor present Nurse Communication: Mobility status;Weight bearing status;Precautions PT Visit Diagnosis: Unsteadiness on feet (R26.81);Muscle weakness (generalized) (M62.81);Difficulty in walking, not elsewhere classified (R26.2)     Time: 7517-0017 PT Time Calculation (min) (ACUTE ONLY): 9 min  Charges:  $Therapeutic Exercise: 8-22 mins                    Chesley Noon, PTA 05/10/21, 11:36 AM

## 2021-05-10 NOTE — Progress Notes (Addendum)
Speech Language Pathology Treatment: Dysphagia  Patient Details Name: Michele Lambert MRN: [id number] DOB: 24-Dec-1926 Today's Date: 05/10/2021 Time: 0973-5329 SLP Time Calculation (min) (ACUTE ONLY): 60 min  Assessment / Plan / Recommendation Clinical Impression  Pt seen today at the request of NSG and MD for f/u assessment of pt's swallowing, toleration of diet. NSG reported she had a decline in toleration of her diet -- oral holding of po's this morning. Pt was on a Dysphagia level 2 diet w/ thin liquids.  Per chart report, pt's Hemoglobin had dropped to 7.3 from 8.0; transfusion was given in later morning. Pt was also given scheduled Pain Meds and was "lethargic most of the day yesterday", per the Son present in room who had been visiting(he stated she was "lethargic" this morning as well). In the past ~2 hours, Son stated "this is the most alert she has been".   At this assessment, pt appeared to exhibit increased Left sided weakness: left oral/labial weakness and reduced coordination of left hand during tasks. Left lingula weakness noted as well during contact task w/ tongue blade and during lingual sweeping and bolus management. Speech intelligibility much reduced; mumbled as well(which might be impacted by Cognitive decline as well). Pt was SIGNIFICANTLY DISTRACTED and PICKING constantly at covers, tissue, [place]. This is a change/decline from her presentation at the initial BSE on 05/08/2021 -- MD made aware during session.   Pt presented w/ oropharyngeal phase dysphagia c/b MOD+ oral phase deficits including: decreased bolus control/management, anterior leakage and spillage, increased oral phase time w/ slow A-P transfer, oral holding. Left lingual and labial OM weakness: MOD+. Pt is also impacted by declined Cognitive status, baseline Dementia, impacting her overall awareness/engagement w/ po tasks which increases risk for aspiration. Pt is at MOD+ risk for aspiration d/t impact of Baseline Dementia,  new CVA(basal ganglia), advanced age, medications, and deconditioned State. Risk can be reduced somewhat when following aspiration precautions, given feeding support, and when using a modified Dysphagia diet including Nectar consistency liquids. She requires MOD+ oral, tactile/verbal/visual cues and monitoring w/ all oral intake.   Pt consumed trials of ice chips, thin and Nectar liquids via TSP and Cup, and purees. No overt coughing or throat clearing noted, however, mildly wet vocal quality noted x2-3 b/t trials of foods/liquids -- suspect decreased awareness to clear. No decline in respiratory status during/post trials. HOWEVER, there is concern for pharyngeal phase deficits d/t the degree of Cognitive decline w/ decreased attention/awareness, as well as the oral phase deficits/dysphagia as described above. Oral phase was lengthy w/ anterior leakage noted; pt often wiped at her mouth and was distracted by this as well as distracted by her environment. She required MOD+ visual/verbal/tactile cues w/ basic 1-step commands. Pt was encouraged and given the Cup to Hold when Drinking w/ decreased awareness during oral prep noted. FULL feeding support given w/ foods.   D/t a change in presentation, recommend a Dysphagia level 1(PUREE foods moistened well) w/ Nectar liquids VIA TSP/CUP -- no straws. Aspiration precautions including reduce Distractions during meals and engage pt in self-feeding w/ support offered, check for oral clearing during/post intake as needed, cues to swallow/clear and alternate foods/liquids. Pills Crushed in Puree for safer swallowing. NSG/MD updated. ST will continue to follow while admitted.  Recommend ST services f/u at SNF/Rehab for ongoing assessment, education, and any trial upgrade of diet post the Acuity of this illness/admit secondary to Baseline Dementia/Cognitive decline.   Discussed diet consistency of foods/thickened liquids for now and at  Rehab; aspiration precautions; pills  Crushed in puree; feeding support and Supervision w/ Son in room. Precautions posted in room on the above discussed.        HPI HPI: Pt  is a 88 y.o. female with medical history significant of Dementia, HTN, hypothyroidism, who presents to the ED from assisted living facility following a witnessed fall.  Staff told EMS that he had a thud in the patient's room and they found her on the floor.  She complains of left hip pain.  She was previously in her usual state of health.  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      SLP Plan  Continue with current plan of care      Recommendations for follow up therapy are one component of a multi-disciplinary discharge planning process, led by the attending physician.  Recommendations may be updated based on patient status, additional functional criteria and insurance authorization.    Recommendations  Diet recommendations: Dysphagia 1 (puree);Nectar-thick liquid Liquids provided via: Cup;Teaspoon (monitor) Medication Administration: Crushed with puree Supervision: Full supervision/cueing for compensatory strategies;Staff to assist with self feeding Compensations: Minimize environmental distractions;Slow rate;Small sips/bites;Lingual sweep for clearance of pocketing;Multiple dry swallows after each bite/sip;Follow solids with liquid;Monitor for anterior loss Postural Changes and/or Swallow Maneuvers: Out of bed for meals;Seated upright 90 degrees;Upright 30-60 min after meal                General recommendations:  (Dietician f/u; Palliative Care f/u for support/GOC) Oral  Care Recommendations: Oral care BID;Oral care before and after PO;Staff/trained caregiver to provide oral care Follow Up Recommendations: Skilled nursing-short term rehab (<3 hours/day) Assistance recommended at discharge: Frequent or constant Supervision/Assistance SLP Visit Diagnosis: Dysphagia, oropharyngeal phase (R13.12) (Baseline Cognitive decline, attention; new CVA w/ left oral weakness) Plan: Continue with current plan of care             Orinda Kenner, [place], [place]; [place] [phone] (ascom) Farah Benish  05/10/2021, 4:22 PM

## 2021-05-10 NOTE — NC FL2 (Signed)
[place] LEVEL OF CARE SCREENING TOOL     IDENTIFICATION  Patient Name: Michele Lambert Birthdate: Jan 25, 1927 Sex: female Admission Date (Current Location): 05/07/2021  [facility] and [place] Number:  Engineering geologist and Address:  [facility], [address] [place], [place] [address]      Provider Number: [id number]  Attending Physician Name and Address:  Gwynne Edinger, MD  Relative Name and Phone Number:  Raquel Sarna (daughter) [phone]    Current Level of Care: Hospital Recommended Level of Care: Memory Care Prior Approval Number:    Date Approved/Denied:   PASRR Number: [id number]  Discharge Plan: Other (Comment) (Memory Care - [facility])    Current Diagnoses: Patient Active Problem List   Diagnosis Date Noted   Traumatic intraparenchymal hemorrhage 05/07/2021   Closed intertrochanteric fracture of left hip, initial encounter ([place]) 05/07/2021   Age-related osteoporosis without current pathological fracture 05/07/2021   Essential hypertension 05/07/2021   CKD (chronic kidney disease) stage 3, GFR 30-59 ml/min ([place]) 10/01/2019   Fracture of distal humerus 05/03/2018   Displaced fracture of right humerus 05/01/2018   Hypothyroidism 05/01/2018   Dementia ([place]) 05/01/2018    Orientation RESPIRATION BLADDER Height & Weight     Self  Normal Incontinent, External catheter Weight: 150 lb 12.8 oz (68.4 kg) Height:  5' (152.4 cm)  BEHAVIORAL SYMPTOMS/MOOD NEUROLOGICAL BOWEL NUTRITION STATUS      Incontinent Diet (dysphagia diet)  AMBULATORY STATUS COMMUNICATION OF NEEDS Skin   Extensive Assist Verbally Other (Comment) (left hip closed incision)                       Personal Care Assistance Level of Assistance  Bathing, Feeding, Dressing, Total care Bathing Assistance: Maximum assistance Feeding assistance: Limited assistance Dressing Assistance: Maximum assistance Total Care Assistance: Maximum assistance    Functional Limitations Info  Sight, Hearing, Speech Sight Info: Impaired Hearing Info: Impaired Speech Info: Adequate    SPECIAL CARE FACTORS FREQUENCY  PT (By licensed PT), OT (By licensed OT) (home health)     PT Frequency: min 3x weekly OT Frequency: min 3x weekly            Contractures Contractures Info: Not present    Additional Factors Info  Code Status, Allergies Code Status Info: DNR Allergies Info: Codeine, lescol (fluvastatin sodium)           Current Medications (05/10/2021):  This is the current hospital active medication list Current Facility-Administered Medications  Medication Dose Route Frequency Provider Last Rate Last Admin   0.9 %  sodium chloride infusion (Manually program via Guardrails IV Fluids)   Intravenous Once Foust, Katy L, NP       0.9 %  sodium chloride infusion   Intravenous Continuous Gwynne Edinger, MD 75 mL/hr at 05/10/21 0507 New Bag at 05/10/21 0507   acetaminophen (TYLENOL) tablet 650 mg  650 mg Oral TID Gwynne Edinger, MD   650 mg at 05/09/21 2121   alum & mag hydroxide-simeth (MAALOX/MYLANTA) 200-200-20 MG/5ML suspension 30 mL  30 mL Oral Q4H PRN [place], MD       bisacodyl (DULCOLAX) suppository 10 mg  10 mg Rectal Daily PRN [place], MD       Chlorhexidine Gluconate Cloth 2 % PADS 6 each  6 each Topical Daily Athena Masse, MD   6 each at 05/09/21 3354   docusate sodium (COLACE) capsule 100 mg  100 mg Oral BID Mack Guise,  Lennette Bihari, MD   100 mg at 05/09/21 2121   enoxaparin (LOVENOX) injection 30 mg  30 mg Subcutaneous Q24H Dallie Piles, RPH   30 mg at 05/09/21 7124   labetalol (NORMODYNE) injection 20 mg  20 mg Intravenous Q10 min PRN [place], MD       levothyroxine (SYNTHROID) tablet 125 mcg  125 mcg Oral QAC breakfast [place], MD   125 mcg at 05/10/21 0507   losartan (COZAAR) tablet 50 mg  50 mg Oral QPM [place], MD   50 mg at 05/09/21 1607   menthol-cetylpyridinium (CEPACOL)  lozenge 3 mg  1 lozenge Oral PRN [place], MD       Or   phenol (CHLORASEPTIC) mouth spray 1 spray  1 spray Mouth/Throat PRN [place], MD       methocarbamol (ROBAXIN) tablet 500 mg  500 mg Oral Q6H PRN [place], MD       Or   methocarbamol (ROBAXIN) 500 mg in dextrose 5 % 50 mL IVPB  500 mg Intravenous Q6H PRN [place], MD       multivitamin with minerals tablet 1 tablet  1 tablet Oral Daily [place], MD   1 tablet at 05/09/21 0823   ondansetron [facility]) tablet 4 mg  4 mg Oral Q6H PRN [place], MD       Or   ondansetron [facility]) injection 4 mg  4 mg Intravenous Q6H PRN [place], MD       oxyCODONE (Oxy IR/ROXICODONE) immediate release tablet 5 mg  5 mg Oral Q6H PRN Wouk, Ailene Rud, MD       polyethylene glycol [facility] / GLYCOLAX) packet 17 g  17 g Oral Daily Gwynne Edinger, MD   17 g at 05/09/21 5809   senna (SENOKOT) tablet 8.6 mg  1 tablet Oral BID [place], MD   8.6 mg at 05/09/21 2121   traMADol (ULTRAM) tablet 50 mg  50 mg Oral Q6H PRN Wouk, Ailene Rud, MD         Discharge Medications: Please see discharge summary for a list of discharge medications.  Relevant Imaging Results:  Relevant Lab Results:   Additional Information [id number]  Alberteen Sam, LCSW

## 2021-05-10 NOTE — Progress Notes (Signed)
PROGRESS NOTE    Michele Lambert  VOH:[id number] DOB: 04-16-1926 [name]: 05/07/2021 PCP: Dion Body, MD  Outpatient Specialists: none    Brief Narrative:   Hx dementia, htn, hypothyroid, presented AM of 2/3 after unwitnessed fall at her nursing home. Staff heard a "thud" and found the patient on the floor complaining of left hip pain. At baseline ambulates with a walker. Found to have left intratrochanteric fracture as well as intraparenchymal hemorrhage on CT of brain.   Assessment & Plan:   Active Problems:   Hypothyroidism   Dementia ([place])   Traumatic intraparenchymal hemorrhage   Closed intertrochanteric fracture of left hip, initial encounter (HCC)   CKD (chronic kidney disease) stage 3, GFR 30-59 ml/min (HCC)   Age-related osteoporosis without current pathological fracture   Essential hypertension  # Left comminuted intratrochanteric fracture S/p intramedullary fixation on 2/3 w/ ortho. Hgb 7.3 today, transfused one unit, repeat hgb 8.6. pain controlled. - neurosurg ok with lovenox for ppx - plan for skilled nursing at d/c, possibly tomorrow. Covid swab ordered - monitor hgb again tomorrow - pain control, bowel regimen.   # Traumatic intraparenchymal hemorrhage CT shows intraparenchymal hemorrhage in right basal ganglia w/ intraventricular extension w/o evidence hydrocephalus, mass effect, or midline shift. Neurosurgery and neurology consulted. Relatively stable on repeat CT. Goal normotension. No indication for neurosurg intervention and family says they would likely decline. 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 Focusing on hydration currently  # Urinary retention, post-op Retained overnight 2/4, required I/o cath x1. Has voided spontaneously since then, bladder scans have showed less than 300 cc urine - bladder  scan q shift, I/o cath prn for now  # Alzheimer's dementia Baseline is ambulatory with walker but unsteady on feet. Very poor memory. No behavioral disturbances.  # Hypothyroid - home synthroid  # HTN Here bp wnl - cont home losartan,  - home hctz on hold   DVT prophylaxis: lovenox Code Status: DNR Family Communication: sons updated @ bedside and telephonically 2/6  Level of care: Progressive Status is: Inpatient Remains inpatient appropriate because: severity of illness    Consultants:  Ortho, neuro, neurosurg  Procedures: ORIF  Antimicrobials:  none    Subjective: Awake, doesn't appear to be in pain  Objective: Vitals:   05/10/21 0701 05/10/21 0801 05/10/21 1000 05/10/21 1137  BP: (!) 134/58 (!) 158/68 131/71 130/70  Pulse: 88 85 80 80  Resp: 18 20 18 19   Temp: 99.1 F (37.3 C) 98.2 F (36.8 C) 98.3 F (36.8 C) 98.3 F (36.8 C)  TempSrc: Oral  Oral Oral  SpO2: 92% 94%  94%  Weight:      Height:        Intake/Output Summary (Last 24 hours) at 05/10/2021 1434 Last data filed at 05/10/2021 1426 Gross per 24 hour  Intake 1710.92 ml  Output 300 ml  Net 1410.92 ml   Filed Weights   05/07/21 0230  Weight: 68.4 kg    Examination:  General exam: Appears calm and comfortable  Respiratory system: normal WOB Cardiovascular system: S1 & S2 heard, RRR. No JVD, murmurs, rubs, gallops or clicks.  Gastrointestinal system: Abdomen is nondistended, soft and nontender Central nervous system: moves all 4 ext Extremities: warm, palpable DPs Skin: dressings left hip clean and dry Psychiatry: calm    Data Reviewed: I have personally reviewed following labs  and imaging studies  CBC: Recent Labs  Lab 05/07/21 0941 05/08/21 0430 05/09/21 0529 05/10/21 0426 05/10/21 1055  WBC 17.3* 12.6* 12.2* 14.3*  --   NEUTROABS 14.5*  --   --   --   --   HGB 11.1* 8.4* 8.0* 7.3* 8.6*  HCT 34.5* 25.7* 24.6* 23.1* 25.6*  MCV 87.6 86.5 87.5 89.5  --   PLT 246 171 189  207  --    Basic Metabolic Panel: Recent Labs  Lab 05/07/21 0941 05/08/21 0430 05/09/21 0529 05/10/21 0426  NA 135 136 138 139  K 3.9 4.0 4.0 3.9  CL 99 104 106 109  CO2 28 26 26 26   GLUCOSE 118* 125* 100* 120*  BUN 29* 28* 33* 33*  CREATININE 0.97 1.04* 1.10* 0.93  CALCIUM 8.8* 7.8* 7.9* 7.9*   GFR: Estimated Creatinine Clearance: 33.3 mL/min (by C-G formula based on SCr of 0.93 mg/dL). Liver Function Tests: Recent Labs  Lab 05/07/21 0941  AST 29  ALT 15  ALKPHOS 53  BILITOT 0.6  PROT 6.4*  ALBUMIN 3.5   No results for input(s): LIPASE, AMYLASE in the last 168 hours. No results for input(s): AMMONIA in the last 168 hours. Coagulation Profile: No results for input(s): INR, PROTIME in the last 168 hours. Cardiac Enzymes: No results for input(s): CKTOTAL, CKMB, CKMBINDEX, TROPONINI in the last 168 hours. BNP (last 3 results) No results for input(s): PROBNP in the last 8760 hours. HbA1C: No results for input(s): HGBA1C in the last 72 hours. CBG: No results for input(s): GLUCAP in the last 168 hours. Lipid Profile: No results for input(s): CHOL, HDL, LDLCALC, TRIG, CHOLHDL, LDLDIRECT in the last 72 hours. Thyroid Function Tests: No results for input(s): TSH, T4TOTAL, FREET4, T3FREE, THYROIDAB in the last 72 hours. Anemia Panel: No results for input(s): VITAMINB12, FOLATE, FERRITIN, TIBC, IRON, RETICCTPCT in the last 72 hours. Urine analysis:    Component Value Date/Time   COLORURINE STRAW (A) 08/07/2020 1528   APPEARANCEUR CLEAR (A) 08/07/2020 1528   LABSPEC 1.005 08/07/2020 1528   PHURINE 8.0 08/07/2020 1528   GLUCOSEU NEGATIVE 08/07/2020 1528   HGBUR NEGATIVE 08/07/2020 1528   BILIRUBINUR NEGATIVE 08/07/2020 1528   KETONESUR NEGATIVE 08/07/2020 1528   PROTEINUR NEGATIVE 08/07/2020 1528   NITRITE NEGATIVE 08/07/2020 1528   LEUKOCYTESUR NEGATIVE 08/07/2020 1528   Sepsis Labs: @LABRCNTIP (procalcitonin:4,lacticidven:4)  ) Recent Results (from the past 240  hour(s))  Resp Panel by RT-PCR (Flu A&B, Covid) Nasopharyngeal Swab     Status: None   Collection Time: 05/07/21  3:26 AM   Specimen: Nasopharyngeal Swab; Nasopharyngeal(NP) swabs in vial transport medium  Result Value Ref Range Status   SARS Coronavirus 2 by RT PCR NEGATIVE NEGATIVE Final    Comment: (NOTE) SARS-CoV-2 target nucleic acids are NOT DETECTED.  The SARS-CoV-2 RNA is generally detectable in upper respiratory specimens during the acute phase of infection. The lowest concentration of SARS-CoV-2 viral copies this assay can detect is 138 copies/mL. A negative result does not preclude SARS-Cov-2 infection and should not be used as the sole basis for treatment or other patient management decisions. A negative result may occur with  improper specimen collection/handling, submission of specimen other than nasopharyngeal swab, presence of viral mutation(s) within the areas targeted by this assay, and inadequate number of viral copies(<138 copies/mL). A negative result must be combined with clinical observations, patient history, and epidemiological information. The expected result is Negative.  Fact Sheet for Patients:  EntrepreneurPulse.com.au  Fact Sheet for Healthcare Providers:  IncredibleEmployment.be  This test is no t yet approved or cleared by the [place] and  has been authorized for detection and/or diagnosis of SARS-CoV-2 by FDA under an Emergency Use Authorization (EUA). This EUA will remain  in effect (meaning this test can be used) for the duration of the COVID-19 declaration under Section 564(b)(1) of the Act, 21 U.S.C.section 360bbb-3(b)(1), unless the authorization is terminated  or revoked sooner.       Influenza A by PCR NEGATIVE NEGATIVE Final   Influenza B by PCR NEGATIVE NEGATIVE Final    Comment: (NOTE) The Xpert Xpress SARS-CoV-2/FLU/RSV plus assay is intended as an aid in the diagnosis of influenza from  Nasopharyngeal swab specimens and should not be used as a sole basis for treatment. Nasal washings and aspirates are unacceptable for Xpert Xpress SARS-CoV-2/FLU/RSV testing.  Fact Sheet for Patients: EntrepreneurPulse.com.au  Fact Sheet for Healthcare Providers: IncredibleEmployment.be  This test is not yet approved or cleared by the [place] FDA and has been authorized for detection and/or diagnosis of SARS-CoV-2 by FDA under an Emergency Use Authorization (EUA). This EUA will remain in effect (meaning this test can be used) for the duration of the COVID-19 declaration under Section 564(b)(1) of the Act, 21 U.S.C. section 360bbb-3(b)(1), unless the authorization is terminated or revoked.  Performed at [facility], [address] [place], [place] [address]          Radiology Studies: No results found.      Scheduled Meds:  acetaminophen  650 mg Oral TID   Chlorhexidine Gluconate Cloth  6 each Topical Daily   docusate sodium  100 mg Oral BID   enoxaparin (LOVENOX) injection  30 mg Subcutaneous Q24H   levothyroxine  125 mcg Oral QAC breakfast   losartan  50 mg Oral QPM   multivitamin with minerals  1 tablet Oral Daily   polyethylene glycol  17 g Oral Daily   senna  1 tablet Oral BID   Continuous Infusions:  sodium chloride 75 mL/hr at 05/10/21 1426   methocarbamol (ROBAXIN) IV       LOS: 3 days    Time spent: 25 min    Desma Maxim, MD [facility]   If 7PM-7AM, please contact night-coverage [email or web address] Password [facility] 05/10/2021, 2:34 PM

## 2021-05-11 LAB — BASIC METABOLIC PANEL
Anion gap: 4 — ABNORMAL LOW (ref 5–15)
BUN: 29 mg/dL — ABNORMAL HIGH (ref 8–23)
CO2: 26 mmol/L (ref 22–32)
Calcium: 8.2 mg/dL — ABNORMAL LOW (ref 8.9–10.3)
Chloride: 110 mmol/L (ref 98–111)
Creatinine, Ser: 0.85 mg/dL (ref 0.44–1.00)
GFR, Estimated: >60 mL/min (ref >60)
Glucose, Bld: 96 mg/dL (ref 70–99)
Potassium: 4.4 mmol/L (ref 3.5–5.1)
Sodium: 140 mmol/L (ref 135–145)

## 2021-05-11 LAB — CBC
HCT: 30.7 % — ABNORMAL LOW (ref 36.0–46.0)
Hemoglobin: 9.8 g/dL — ABNORMAL LOW (ref 12.0–15.0)
MCH: 28.6 pg (ref 26.0–34.0)
MCHC: 31.9 g/dL (ref 30.0–36.0)
MCV: 89.5 fL (ref 80.0–100.0)
Platelets: 214 10*3/uL (ref 150–400)
RBC: 3.43 MIL/uL — ABNORMAL LOW (ref 3.87–5.11)
RDW: 14.9 % (ref 11.5–15.5)
WBC: 12.5 10*3/uL — ABNORMAL HIGH (ref 4.0–10.5)
nRBC: 0 % (ref 0.0–0.2)

## 2021-05-11 LAB — BPAM RBC
Blood Product Expiration Date: 202302092359
ISSUE DATE / TIME: 202302060638
Unit Type and Rh: 9500

## 2021-05-11 LAB — TYPE AND SCREEN
ABO/RH(D): O NEG
Antibody Screen: NEGATIVE
Unit division: 0

## 2021-05-11 MED ORDER — TRAMADOL HCL 50 MG PO TABS: 50.0000 mg | ORAL_TABLET | Freq: Four times a day (QID) | ORAL | 0 refills | Status: DC | PRN

## 2021-05-11 MED ORDER — ENOXAPARIN SODIUM 40 MG/0.4ML IJ SOSY: 40.0000 mg | PREFILLED_SYRINGE | INTRAMUSCULAR | Status: DC

## 2021-05-11 MED ORDER — ENOXAPARIN SODIUM 40 MG/0.4ML IJ SOSY
40.0000 mg | PREFILLED_SYRINGE | INTRAMUSCULAR | Status: DC
  Administered 2021-05-11: 40 mg via SUBCUTANEOUS
  Filled 2021-05-11: qty 0.4

## 2021-05-11 NOTE — Progress Notes (Signed)
[facility]ed [facility] facility and Reported to Nurse Miranda.

## 2021-05-11 NOTE — Progress Notes (Signed)
PT with smear BM on 2/5 and smear on 2/7. Suppository given. No success with Bm Yet. Per attending MD. Pt okay to discharge.

## 2021-05-11 NOTE — TOC Progression Note (Signed)
Transition of Care [facility]) - Progression Note    Patient Details  Name: Michele Lambert MRN: [id number] Date of Birth: 22-Nov-1926  Transition of Care [facility]) CM/SW [place], RN Phone Number: 05/11/2021, 11:30 AM  Clinical Narrative:    Family has brought the wheelchair and the [facility] will set up transport   Expected Discharge Plan: [place] Barriers to Discharge: Continued Medical Work up  Expected Discharge Plan and Services Expected Discharge Plan: [place] arrangements for the past 2 months:  (home place [place]) Expected Discharge Date: 05/11/21                                     Social Determinants of Health (SDOH) Interventions    Readmission Risk Interventions No flowsheet data found.

## 2021-05-11 NOTE — Progress Notes (Signed)
°  Subjective:  POD #4 s/p intramedullary fixation for left intertrochanteric hip fracture.   Patient has dementia and is unable to provide an accurate history.  She does not appear to have any significant pain.  Her son is at the bedside.    Objective:   VITALS:   Vitals:   05/11/21 0413 05/11/21 0809 05/11/21 1000 05/11/21 1148  BP: (!) 143/71 (!) 155/70  (!) 154/73  Pulse: 80 80  83  Resp: 18 16  18   Temp: (!) 97.4 F (36.3 C) 98.4 F (36.9 C)  97.9 F (36.6 C)  TempSrc:      SpO2: 95% 100% 96% 96%  Weight:      Height:        PHYSICAL EXAM: Left lower extremity Neurovascular intact Sensation intact distally Intact pulses distally Dorsiflexion/Plantar flexion intact Proximal honeycomb dressing with scant serous drainage No cellulitis present Compartment soft  LABS  Results for orders placed or performed during the hospital encounter of 05/07/21 (from the past 24 hour(s))  Resp Panel by RT-PCR (Flu A&B, Covid) Nasopharyngeal Swab     Status: None   Collection Time: 05/10/21  6:00 PM   Specimen: Nasopharyngeal Swab; Nasopharyngeal(NP) swabs in vial transport medium  Result Value Ref Range   SARS Coronavirus 2 by RT PCR NEGATIVE NEGATIVE   Influenza A by PCR NEGATIVE NEGATIVE   Influenza B by PCR NEGATIVE NEGATIVE  CBC     Status: Abnormal   Collection Time: 05/11/21  3:47 AM  Result Value Ref Range   WBC 12.5 (H) 4.0 - 10.5 K/uL   RBC 3.43 (L) 3.87 - 5.11 MIL/uL   Hemoglobin 9.8 (L) 12.0 - 15.0 g/dL   HCT 30.7 (L) 36.0 - 46.0 %   MCV 89.5 80.0 - 100.0 fL   MCH 28.6 26.0 - 34.0 pg   MCHC 31.9 30.0 - 36.0 g/dL   RDW 14.9 11.5 - 15.5 %   Platelets 214 150 - 400 K/uL   nRBC 0.0 0.0 - 0.2 %  Basic metabolic panel     Status: Abnormal   Collection Time: 05/11/21  3:47 AM  Result Value Ref Range   Sodium 140 135 - 145 mmol/L   Potassium 4.4 3.5 - 5.1 mmol/L   Chloride 110 98 - 111 mmol/L   CO2 26 22 - 32 mmol/L   Glucose, Bld 96 70 - 99 mg/dL   BUN 29 (H) 8 -  23 mg/dL   Creatinine, Ser 0.85 0.44 - 1.00 mg/dL   Calcium 8.2 (L) 8.9 - 10.3 mg/dL   GFR, Estimated >60 >60 mL/min   Anion gap 4 (L) 5 - 15    No results found.  Assessment/Plan: 4 Days Post-Op   Active Problems:   Hypothyroidism   Dementia ([place])   Traumatic intraparenchymal hemorrhage   Closed intertrochanteric fracture of left hip, initial encounter (HCC)   CKD (chronic kidney disease) stage 3, GFR 30-59 ml/min (HCC)   Age-related osteoporosis without current pathological fracture   Essential hypertension   Patient is stable from an orthopedic standpoint.  Patient will be discharged back to [facility] today and follow-up with me in 10 to 14 days for wound check and staple removal.  Patient will continue physical therapy as she can tolerate.  Patient may be discharged on Lovenox or enteric-coated aspirin 81 mg p.o. twice daily for DVT prophylaxis until follow-up.   [place] , MD 05/11/2021, 2:11 PM

## 2021-05-11 NOTE — TOC Progression Note (Signed)
Transition of Care [facility]) - Progression Note    Patient Details  Name: Michele Lambert MRN: [id number] Date of Birth: 03/04/1927  Transition of Care [facility]) CM/SW [place], RN Phone Number: 05/11/2021, 1:25 PM  Clinical Narrative:   Called EMS to [facility], there is [facility] ahead of her, her family is aware    Expected Discharge Plan: [place] Barriers to Discharge: Continued Medical Work up  Expected Discharge Plan and Services Expected Discharge Plan: [place] arrangements for the past 2 months:  (home place [place]) Expected Discharge Date: 05/11/21                                     Social Determinants of Health (SDOH) Interventions    Readmission Risk Interventions No flowsheet data found.

## 2021-05-11 NOTE — Progress Notes (Signed)
PHARMACIST - PHYSICIAN COMMUNICATION  CONCERNING:  Enoxaparin (Lovenox) for DVT Prophylaxis    RECOMMENDATION: Patient on  enoxaprin 30mg  q24 hours for VTE prophylaxis.   Filed Weights   05/07/21 0230  Weight: 68.4 kg (150 lb 12.8 oz)    Body mass index is 29.45 kg/m.  Estimated Creatinine Clearance: 36.5 mL/min (by C-G formula based on SCr of 0.85 mg/dL).  Patient is candidate for enoxaparin 40mg  every 24 hours based on now CrCl >32ml/min   DESCRIPTION: Pharmacy has adjusted enoxaparin dose per [facility] policy.  Patient is now receiving enoxaparin 40 mg every 24 hours    Noralee Space, PharmD Clinical Pharmacist  05/11/2021 9:12 AM

## 2021-05-11 NOTE — Discharge Summary (Addendum)
Michele Lambert FBP:[id number] DOB: Mar 10, 1927 [name]: 05/07/2021  PCP: Dion Body, MD  Admit date: 05/07/2021 Discharge date: 05/11/2021  Time spent: 45 minutes  Recommendations for Outpatient Follow-up:  Orthopedics f/u 2 weeks Urology f/u 2 weeks    Discharge Diagnoses:  Active Problems:   Hypothyroidism   Dementia ([place])   Traumatic intraparenchymal hemorrhage   Closed intertrochanteric fracture of left hip, initial encounter (HCC)   CKD (chronic kidney disease) stage 3, GFR 30-59 ml/min (HCC)   Age-related osteoporosis without current pathological fracture   Essential hypertension   Discharge Condition: stable  Diet recommendation: dysphagia  Filed Weights   05/07/21 0230  Weight: 68.4 kg    History of present illness:  From admission h and p  Michele Lambert is a 88 y.o. female with medical history significant of Dementia, HTN, hypothyroidism, who presents to the ED from assisted living facility following a witnessed fall.  Staff told EMS that he had a thud in the patient's room and they found her on the floor.  She complains of left hip pain.  She was previously in her usual state of health.  She denies chest pain, shortness of breath, headache or neck pain, nausea or vomiting   ED course: On arrival BP 170/131 with otherwise normal vitals Blood work pending   Imaging: CT head with intraparenchymal hemorrhage in the right basal ganglia without evidence of hydrocephalus, significant mass effect or midline shift Hip x-ray with comminuted left intertrochanteric fracture Chest x-ray no acute abnormality   The ED provider spoke with neurosurgery who advised to repeat CT head in 6 hours to assess for stability of bleed ED provider spoke with patient's son who is a doctor and her POA who declined any neurosurgical intervention for worsening bleed but was agreeable to intervention for hip repair to maintain functional status Ortho was consulted from the ED and will plan  operative repair pending confirm stability of brain bleed   Hospitalist consulted for admission.   Hospital Course:  # Left comminuted intratrochanteric fracture # Acute blood loss anemia S/p intramedullary fixation on 2/3 w/ ortho. Hgb nadir to7.3 , transfused one unit, repeat hgb in the 9s.  - neurosurg ok with lovenox for ppx, plan for total 35 days through 3/10 - ortho f/u 2 weeks - pain control, bowel regimen.    # Traumatic intraparenchymal hemorrhage CT shows intraparenchymal hemorrhage in right basal ganglia w/ intraventricular extension w/o evidence hydrocephalus, mass effect, or midline shift. Neurosurgery and neurology consulted. Relatively stable on repeat CT. Goal normotension. No indication for neurosurg intervention and family says they would likely decline. Has left facial droop that is improved. 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 Focusing on hydration currently   # Urinary retention, post-op Retained overnight 2/4, required I/o cath x1. Has voided spontaneously since then, but today just prior to transportation bladder scan shows 600, patient won't/can't void - will place Foley - will need urology f/u 2 weeks   # Alzheimer's dementia Baseline is ambulatory with walker but unsteady on feet. Very poor memory. No behavioral disturbances.   # Hypothyroid - home synthroid   # HTN Here bp wnl - cont home losartan, hctz    Procedures: Left hip ORIF 2/3   Consultations: Orthopedics, neurosurgery, neurology  Discharge Exam: Vitals:   05/11/21 0413 05/11/21 0809  BP: (!) 143/71 (!) 155/70  Pulse:  80 80  Resp: 18 16  Temp: (!) 97.4 F (36.3 C) 98.4 F (36.9 C)  SpO2: 95% 100%    General exam: Appears calm and comfortable  Respiratory system: normal WOB Cardiovascular system: S1 & S2 heard, RRR. No JVD, murmurs, rubs,  gallops or clicks.  Gastrointestinal system: Abdomen is nondistended, soft and nontender Central nervous system: moves all 4 ext Extremities: warm, palpable DPs Skin: dressings left hip clean and dry Psychiatry: calm,   Discharge Instructions   Discharge Instructions     Diet - low sodium heart healthy   Complete by: As directed    Increase activity slowly   Complete by: As directed    Leave dressing on - Keep it clean, dry, and intact until clinic visit   Complete by: As directed       Allergies as of 05/11/2021       Reactions   Codeine    Lescol [fluvastatin Sodium]         Medication List     STOP taking these medications    CALCIUM 600 PO       TAKE these medications    acetaminophen 650 MG CR tablet Commonly known as: TYLENOL Take 650 mg by mouth every 8 (eight) hours as needed.   Calcium Carb-Cholecalciferol 600-10 MG-MCG Tabs Take 1 tablet by mouth 2 (two) times daily.   enoxaparin 40 MG/0.4ML injection Commonly known as: LOVENOX Inject 0.4 mLs (40 mg total) into the skin daily.   hydrochlorothiazide 25 MG tablet Commonly known as: HYDRODIURIL Take 25 mg by mouth daily.   levothyroxine 125 MCG tablet Commonly known as: SYNTHROID Take 125 mcg by mouth every morning. What changed: Another medication with the same name was removed. Continue taking this medication, and follow the directions you see here.   losartan 50 MG tablet Commonly known as: COZAAR Take 50 mg by mouth every evening.   polyethylene glycol 17 g packet Commonly known as: MIRALAX / GLYCOLAX Take 17 g by mouth daily. What changed:  when to take this reasons to take this   traMADol 50 MG tablet Commonly known as: ULTRAM Take 1 tablet (50 mg total) by mouth every 6 (six) hours as needed for moderate pain.   traZODone 50 MG tablet Commonly known as: DESYREL Take 1 tablet (50 mg total) by mouth at bedtime as needed for up to 30 days for sleep.                Discharge Care Instructions  (From admission, onward)           Start     Ordered   05/11/21 0000  Leave dressing on - Keep it clean, dry, and intact until clinic visit        05/11/21 0956           Allergies  Allergen Reactions   Codeine    Lescol [Fluvastatin Sodium]     Follow-up Information     Thornton Park, MD Follow up.   Specialty: Orthopedic Surgery Why: call to schedule appointment on or around 05/21/2021 Contact information: [address] [place] [address] [phone]                  The results of significant diagnostics from this hospitalization (including imaging, microbiology, ancillary and laboratory) are listed below for reference.    Significant Diagnostic Studies: CT HEAD WO CONTRAST (5MM)  Result Date: 05/07/2021 CLINICAL DATA:  Intraparenchymal hemorrhage. EXAM: CT HEAD WITHOUT CONTRAST  TECHNIQUE: Contiguous axial images were obtained from the base of the skull through the vertex without intravenous contrast. RADIATION DOSE REDUCTION: This exam was performed according to the departmental dose-optimization program which includes automated exposure control, adjustment of the mA and/or kV according to patient size and/or use of iterative reconstruction technique. COMPARISON:  Of same day. FINDINGS: Brain: Grossly stable size and appearance of right basal ganglia hemorrhage with hemorrhage present in bilateral lateral and third ventricles as well. Stable ventricular size is noted. Cortical atrophy is again noted. No mass effect or midline shift is noted. No new hemorrhage is noted. Vascular: No hyperdense vessel or unexpected calcification. Skull: Normal. Negative for fracture or focal lesion. Sinuses/Orbits: No acute finding. Other: None. IMPRESSION: Stable size and appearance of right basal ganglia intraparenchymal hemorrhage as well as intraventricular hemorrhage involving both lateral ventricles and third ventricle. Electronically  Signed   By: Marijo Conception M.D.   On: 05/07/2021 16:01   CT Head Wo Contrast  Result Date: 05/07/2021 CLINICAL DATA:  Fall, intracranial hemorrhage EXAM: CT HEAD WITHOUT CONTRAST TECHNIQUE: Contiguous axial images were obtained from the base of the skull through the vertex without intravenous contrast. RADIATION DOSE REDUCTION: This exam was performed according to the departmental dose-optimization program which includes automated exposure control, adjustment of the mA and/or kV according to patient size and/or use of iterative reconstruction technique. COMPARISON:  CT head obtained earlier the same day FINDINGS: Brain: Again seen is small volume intraparenchymal hemorrhage centered in the right thalamus and anterior limb of the internal capsule. There is no significant regional mass effect. Intraventricular blood in the bodies of the lateral ventricles, third ventricle, and layering in the occipital horns is again seen. Blood in the occipital horns appears slightly increased from the prior study, though this may reflect redistribution as previously seen blood in the right temporal horn is no longer seen. Blood elsewhere appears unchanged. The ventricular system is unchanged in size and configuration, with no evidence of developing hydrocephalus. Background parenchymal volume is unchanged. Patchy hypodensity in the subcortical and periventricular white matter likely reflecting sequela of chronic white matter microangiopathy is unchanged. There is no evidence of infarct. Vascular: There is calcification of the bilateral cavernous ICAs. Skull: Normal. Negative for fracture or focal lesion. Sinuses/Orbits: The imaged paranasal sinuses are clear. Bilateral lens implants are in place. The globes and orbits are otherwise unremarkable. Other: None. IMPRESSION: 1. Intraventricular blood layering in the occipital horns appears slightly increased since the prior study, though this may reflect redistribution. Additional  intraventricular and intraparenchymal blood products are not significantly changed. 2. Stable size and configuration of the ventricular system. Electronically Signed   By: Valetta Mole M.D.   On: 05/07/2021 09:24   CT Head Wo Contrast  Result Date: 05/07/2021 CLINICAL DATA:  Head trauma, fall EXAM: CT HEAD WITHOUT CONTRAST TECHNIQUE: Contiguous axial images were obtained from the base of the skull through the vertex without intravenous contrast. RADIATION DOSE REDUCTION: This exam was performed according to the departmental dose-optimization program which includes automated exposure control, adjustment of the mA and/or kV according to patient size and/or use of iterative reconstruction technique. COMPARISON:  08/07/2020 FINDINGS: Brain: Intraparenchymal hemorrhage centered in the right basal ganglia. Minimal hypodensity surrounding the basal ganglia portion of the hemorrhage, likely edema. Intraventricular extension into the right-greater-than-left lateral ventricle and third ventricle. Small amount of hemorrhage in the occipital horn of the left lateral ventricle and temporal horn of the right lateral ventricle. Unchanged size and configuration of  the ventricles. No significant mass effect or midline shift. No acute infarct or mass. No additional extra-axial collection. Periventricular white matter changes, likely the sequela of chronic small vessel ischemic disease. Vascular: No hyperdense vessel. Skull: Normal. Negative for fracture or focal lesion. Sinuses/Orbits: Bubbly fluid in the right sphenoid sinus. Status post bilateral lens replacements. Other: The mastoids are well aerated. IMPRESSION: 1. Intraparenchymal hemorrhage centered in the right basal ganglia, with intraventricular extension into the right-greater-than-left lateral ventricle and third ventricle, without evidence of hydrocephalus, significant mass effect, or midline shift. 2. Bubbly fluid in the right sphenoid sinus, as can be seen in the  setting of sinusitis. Correlate with symptoms. These results were called by telephone at the time of interpretation on 05/07/2021 at 3:07 am to provider JADE SUNG , who verbally acknowledged these results. Electronically Signed   By: Merilyn Baba M.D.   On: 05/07/2021 03:10   DG Chest Port 1 View  Result Date: 05/07/2021 CLINICAL DATA:  Recent fall with chest pain, initial encounter EXAM: PORTABLE CHEST 1 VIEW COMPARISON:  08/07/2020 FINDINGS: Cardiac shadow is enlarged but stable. Aortic calcifications are noted. Distal left clavicular fracture is seen. The lungs are clear bilaterally. No other fractures are noted. IMPRESSION: Distal left clavicular fracture. No acute abnormality is noted in the chest. Electronically Signed   By: Inez Catalina M.D.   On: 05/07/2021 03:20   DG C-Arm 1-60 Min-No Report  Result Date: 05/07/2021 CLINICAL DATA:  Left hip fracture. EXAM: DG HIP (WITH OR WITHOUT PELVIS) 2-3V LEFT; DG C-ARM 1-60 MIN-NO REPORT Fluoroscopy time: 1 minute 38 seconds. COMPARISON:  Same day. FINDINGS: Four intraoperative fluoroscopic images were obtained. These demonstrate intramedullary rod fixation proximal left femoral intertrochanteric fracture. Improved alignment of fracture components is noted. IMPRESSION: Fluoroscopic guidance provided during intramedullary rod fixation of left femur for treatment of proximal left femoral intertrochanteric fracture. Electronically Signed   By: Marijo Conception M.D.   On: 05/07/2021 14:37   DG HIP UNILAT WITH PELVIS 2-3 VIEWS LEFT  Result Date: 05/07/2021 CLINICAL DATA:  Left hip fracture. EXAM: DG HIP (WITH OR WITHOUT PELVIS) 2-3V LEFT; DG C-ARM 1-60 MIN-NO REPORT Fluoroscopy time: 1 minute 38 seconds. COMPARISON:  Same day. FINDINGS: Four intraoperative fluoroscopic images were obtained. These demonstrate intramedullary rod fixation proximal left femoral intertrochanteric fracture. Improved alignment of fracture components is noted. IMPRESSION: Fluoroscopic  guidance provided during intramedullary rod fixation of left femur for treatment of proximal left femoral intertrochanteric fracture. Electronically Signed   By: Marijo Conception M.D.   On: 05/07/2021 14:37   DG Hip Unilat With Pelvis 2-3 Views Left  Result Date: 05/07/2021 CLINICAL DATA:  Recent fall with left hip pain, initial encounter EXAM: DG HIP (WITH OR WITHOUT PELVIS) 3V LEFT COMPARISON:  None. FINDINGS: Pelvic ring is intact. Healed fractures are noted in the superior and inferior pubic rami on the right. Comminuted left intratrochanteric fracture is noted with impaction and angulation at the fracture site. No dislocation is seen. IMPRESSION: Comminuted left intratrochanteric fracture as described. Old healed fractures are noted involving the pubic rami on the right. Electronically Signed   By: Inez Catalina M.D.   On: 05/07/2021 03:27   DG FEMUR PORT MIN 2 VIEWS LEFT  Result Date: 05/07/2021 CLINICAL DATA:  Post hip fracture intramedullary nailing. EXAM: LEFT FEMUR PORTABLE 2 VIEWS COMPARISON:  May 07, 2021 preoperative evaluation. FINDINGS: Post antegrade nailing of a comminuted intratrochanteric fracture that was seen previously with placement of a intramedullary rod and  hip screw crossing the femoral neck. Near anatomic alignment. Distal interlocking screws are well noted as well, 2 distal interlocking screws. No unexpected findings with gas in the local soft tissues and skin staples over the LEFT hip and lower thigh. IMPRESSION: Post antegrade nailing of a comminuted intertrochanteric fracture, in near anatomic alignment and no unexpected findings. Electronically Signed   By: Zetta Bills M.D.   On: 05/07/2021 15:17    Microbiology: Recent Results (from the past 240 hour(s))  Resp Panel by RT-PCR (Flu A&B, Covid) Nasopharyngeal Swab     Status: None   Collection Time: 05/07/21  3:26 AM   Specimen: Nasopharyngeal Swab; Nasopharyngeal(NP) swabs in vial transport medium  Result Value Ref  Range Status   SARS Coronavirus 2 by RT PCR NEGATIVE NEGATIVE Final    Comment: (NOTE) SARS-CoV-2 target nucleic acids are NOT DETECTED.  The SARS-CoV-2 RNA is generally detectable in upper respiratory specimens during the acute phase of infection. The lowest concentration of SARS-CoV-2 viral copies this assay can detect is 138 copies/mL. A negative result does not preclude SARS-Cov-2 infection and should not be used as the sole basis for treatment or other patient management decisions. A negative result may occur with  improper specimen collection/handling, submission of specimen other than nasopharyngeal swab, presence of viral mutation(s) within the areas targeted by this assay, and inadequate number of viral copies(<138 copies/mL). A negative result must be combined with clinical observations, patient history, and epidemiological information. The expected result is Negative.  Fact Sheet for Patients:  EntrepreneurPulse.com.au  Fact Sheet for Healthcare Providers:  IncredibleEmployment.be  This test is no t yet approved or cleared by the [place] FDA and  has been authorized for detection and/or diagnosis of SARS-CoV-2 by FDA under an Emergency Use Authorization (EUA). This EUA will remain  in effect (meaning this test can be used) for the duration of the COVID-19 declaration under Section 564(b)(1) of the Act, 21 U.S.C.section 360bbb-3(b)(1), unless the authorization is terminated  or revoked sooner.       Influenza A by PCR NEGATIVE NEGATIVE Final   Influenza B by PCR NEGATIVE NEGATIVE Final    Comment: (NOTE) The Xpert Xpress SARS-CoV-2/FLU/RSV plus assay is intended as an aid in the diagnosis of influenza from Nasopharyngeal swab specimens and should not be used as a sole basis for treatment. Nasal washings and aspirates are unacceptable for Xpert Xpress SARS-CoV-2/FLU/RSV testing.  Fact Sheet for  Patients: EntrepreneurPulse.com.au  Fact Sheet for Healthcare Providers: IncredibleEmployment.be  This test is not yet approved or cleared by the [place] FDA and has been authorized for detection and/or diagnosis of SARS-CoV-2 by FDA under an Emergency Use Authorization (EUA). This EUA will remain in effect (meaning this test can be used) for the duration of the COVID-19 declaration under Section 564(b)(1) of the Act, 21 U.S.C. section 360bbb-3(b)(1), unless the authorization is terminated or revoked.  Performed at [facility], [address] [place], [place] [address]   Resp Panel by RT-PCR (Flu A&B, Covid) Nasopharyngeal Swab     Status: None   Collection Time: 05/10/21  6:00 PM   Specimen: Nasopharyngeal Swab; Nasopharyngeal(NP) swabs in vial transport medium  Result Value Ref Range Status   SARS Coronavirus 2 by RT PCR NEGATIVE NEGATIVE Final    Comment: (NOTE) SARS-CoV-2 target nucleic acids are NOT DETECTED.  The SARS-CoV-2 RNA is generally detectable in upper respiratory specimens during the acute phase of infection. The lowest concentration of SARS-CoV-2 viral copies this assay can  detect is 138 copies/mL. A negative result does not preclude SARS-Cov-2 infection and should not be used as the sole basis for treatment or other patient management decisions. A negative result may occur with  improper specimen collection/handling, submission of specimen other than nasopharyngeal swab, presence of viral mutation(s) within the areas targeted by this assay, and inadequate number of viral copies(<138 copies/mL). A negative result must be combined with clinical observations, patient history, and epidemiological information. The expected result is Negative.  Fact Sheet for Patients:  EntrepreneurPulse.com.au  Fact Sheet for Healthcare Providers:  IncredibleEmployment.be  This test is no t  yet approved or cleared by the [place] FDA and  has been authorized for detection and/or diagnosis of SARS-CoV-2 by FDA under an Emergency Use Authorization (EUA). This EUA will remain  in effect (meaning this test can be used) for the duration of the COVID-19 declaration under Section 564(b)(1) of the Act, 21 U.S.C.section 360bbb-3(b)(1), unless the authorization is terminated  or revoked sooner.       Influenza A by PCR NEGATIVE NEGATIVE Final   Influenza B by PCR NEGATIVE NEGATIVE Final    Comment: (NOTE) The Xpert Xpress SARS-CoV-2/FLU/RSV plus assay is intended as an aid in the diagnosis of influenza from Nasopharyngeal swab specimens and should not be used as a sole basis for treatment. Nasal washings and aspirates are unacceptable for Xpert Xpress SARS-CoV-2/FLU/RSV testing.  Fact Sheet for Patients: EntrepreneurPulse.com.au  Fact Sheet for Healthcare Providers: IncredibleEmployment.be  This test is not yet approved or cleared by the [place] FDA and has been authorized for detection and/or diagnosis of SARS-CoV-2 by FDA under an Emergency Use Authorization (EUA). This EUA will remain in effect (meaning this test can be used) for the duration of the COVID-19 declaration under Section 564(b)(1) of the Act, 21 U.S.C. section 360bbb-3(b)(1), unless the authorization is terminated or revoked.  Performed at [facility], [address] [place], [place] [address]      Labs: Basic Metabolic Panel: Recent Labs  Lab 05/07/21 [id number] 05/08/21 0430 05/09/21 0529 05/10/21 0426 05/11/21 0347  NA 135 136 138 139 140  K 3.9 4.0 4.0 3.9 4.4  CL 99 104 106 109 110  CO2 28 26 26 26 26   GLUCOSE 118* 125* 100* 120* 96  BUN 29* 28* 33* 33* 29*  CREATININE 0.97 1.04* 1.10* 0.93 0.85  CALCIUM 8.8* 7.8* 7.9* 7.9* 8.2*   Liver Function Tests: Recent Labs  Lab 05/07/21 0941  AST 29  ALT 15  ALKPHOS 53  BILITOT 0.6   PROT 6.4*  ALBUMIN 3.5   No results for input(s): LIPASE, AMYLASE in the last 168 hours. No results for input(s): AMMONIA in the last 168 hours. CBC: Recent Labs  Lab 05/07/21 0941 05/08/21 0430 05/09/21 0529 05/10/21 0426 05/10/21 1055 05/11/21 0347  WBC 17.3* 12.6* 12.2* 14.3*  --  12.5*  NEUTROABS 14.5*  --   --   --   --   --   HGB 11.1* 8.4* 8.0* 7.3* 8.6* 9.8*  HCT 34.5* 25.7* 24.6* 23.1* 25.6* 30.7*  MCV 87.6 86.5 87.5 89.5  --  89.5  PLT 246 171 189 207  --  214   Cardiac Enzymes: No results for input(s): CKTOTAL, CKMB, CKMBINDEX, TROPONINI in the last 168 hours. BNP: BNP (last 3 results) Recent Labs    08/07/20 1309 04/09/21 1043  BNP 194.4* 274.4*    ProBNP (last 3 results) No results for input(s): PROBNP in the last 8760 hours.  CBG:  No results for input(s): GLUCAP in the last 168 hours.     Signed:  Desma Maxim MD.  [facility] 05/11/2021, 9:56 AM

## 2021-05-11 NOTE — Progress Notes (Signed)
EMS transported pt to [place]

## 2021-05-11 NOTE — TOC Progression Note (Addendum)
Transition of Care [facility]) - Progression Note    Patient Details  Name: Michele Lambert MRN: [id number] Date of Birth: Aug 09, 1926  Transition of Care [facility]) CM/SW [place], RN Phone Number: 05/11/2021, 10:05 AM  Clinical Narrative:   [facility] to [facility] and They will arrange Transportation, They will call me when they are coming, I notified the Nurse, I spoke with the patient's Son Michele Lambert and notified him that she would be returning to [facility], He asked theat the Doctor give him a call, I relayed the message    Expected Discharge Plan: [place] Barriers to Discharge: Continued Medical Work up  Expected Discharge Plan and Services Expected Discharge Plan: [place] arrangements for the past 2 months:  (home place [place]) Expected Discharge Date: 05/11/21                                     Social Determinants of Health (SDOH) Interventions    Readmission Risk Interventions No flowsheet data found.

## 2021-05-11 NOTE — Progress Notes (Signed)
Speech Language Pathology Treatment: Dysphagia  Patient Details Name: Michele Lambert MRN: [id number] DOB: [birth year] Today's Date: 05/11/2021 Time: 0109-3235 SLP Time Calculation (min) (ACUTE ONLY): 75 min  Assessment / Plan / Recommendation Clinical Impression  Pt seen today for ongoing assessment of pt's swallowing, toleration of dysphagia diet. NSG reported she seemed to tolerate po's this morning w/out overt coughing noted. Son present stated his Sister noted "good" swallowing last night -- multiple po's of puree and Nectar liquids w/out overt difficulties.  Per chart/NSG, Hemoglobin is adequate; no Pain Meds -- no obvious lethargy as she had experienced on Sun/Mon. Pt was resting but fully awakened w/ positioned Upright for po's. Oral care completed w/ debris removed from Left buccal area.    At this assessment, pt exhibited Left sided weakness: left oral/labial weakness and reduced coordination of left hand during tasks. Left lingual weakness noted as well during lingual sweeping and bolus management. This appeared improved from yesterday -- Son agreed also. Speech intelligibility fair w/ few words spoken; mumbled as well(which might be impacted by Cognitive decline as well). Pt was was less DISTRACTED and did not PICK at covers as much -- she was able to use a washcloth to wipe mouth during session.    Pt presented w/ oropharyngeal phase dysphagia c/b MOD+ oral phase deficits including: decreased bolus control/management, min-mod anterior leakage and spillage, oral residue, increased oral phase time w/ slow A-P transfer, oral holding. Left lingual and labial OM weakness: MOD+. Pt is also impacted by declined Cognitive status, baseline Dementia, impacting her overall awareness/engagement w/ po tasks which increases risk for aspiration. Pt is at MOD+ risk for aspiration d/t impact of Baseline Dementia, new CVA(basal ganglia), advanced age, medications, and deconditioned State. Risk can be reduced  somewhat when following aspiration precautions, given feeding support, and when using a modified Dysphagia diet including Nectar consistency liquids. She requires MOD+ oral, tactile/verbal/visual cues and monitoring w/ all oral intake.    Pt consumed trials of Nectar liquids via TSP and purees. No overt coughing; throat clearing and vocal quality noted, however, x2-3 b/t trials of foods/liquids -- suspect decreased awareness to fully swallow/clear. No decline in respiratory status during/post trials. HOWEVER, there is concern for pharyngeal phase deficits d/t the degree of Cognitive decline w/ decreased attention/awareness, as well as the oral phase deficits/dysphagia as described above. Oral phase was lengthy w/ anterior leakage noted; pt often wiped at her mouth. This wiping of mouth was used as a Cue to lingual sweep/swallow to aid oral clearing. She required MOD+ visual/verbal/tactile cues w/ basic 1-step commands. Pt was encouraged to engage in self-feeding, but overall, FULL feeding support given w/ foods.   D/t presentation and risk for aspiration from oropharyngeal phase deficits, recommend a Dysphagia level 1(PUREE foods moistened well) w/ Nectar liquids VIA TSP/CUP -- no straws. Aspiration precautions including reduce Distractions during meals and engage pt in self-feeding w/ support offered, check for oral clearing during/post intake as needed, cues to swallow/clear and alternate foods/liquids. Pills Crushed in Puree for safer swallowing. NSG/MD updated.  Recommend ST services f/u at SNF/Rehab for ongoing assessment, education, and any trial upgrade of diet post the Acuity of this illness/admit secondary to Baseline Dementia/Cognitive decline. Recommend f/u w/ Palliative Care services for support and GOC.    Thorough Education given to Son, then Daughter, on above. Discussed diet consistency of foods/thickened liquids for now and at Rehab; aspiration precautions; pills Crushed in puree; feeding  support and Supervision; swallow strategies; dysphagia and risk for  Pulmonary impact from aspiration. Precautions posted in room on the above discussed.      HPI HPI: Pt  is a 88 y.o. female with medical history significant of Dementia, HTN, hypothyroidism, who presents to the ED from assisted living facility following a witnessed fall.  Staff told EMS that he had a thud in the patient's room and they found her on the floor.  She complains of left hip pain.  She was previously in her usual state of health.  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      SLP Plan  Continue with current plan of care      Recommendations for follow up therapy are one component of a multi-disciplinary discharge planning process, led by the attending physician.  Recommendations may be updated based on patient status, additional functional criteria and insurance authorization.    Recommendations  Diet recommendations: Dysphagia 1 (puree);Nectar-thick liquid Liquids provided via: Teaspoon (only) Medication Administration: Crushed with puree Supervision: Full supervision/cueing for compensatory strategies;Staff to assist with self feeding Compensations: Minimize environmental distractions;Slow rate;Small sips/bites;Lingual sweep for clearance of pocketing;Monitor for anterior loss;Multiple dry swallows after each bite/sip;Follow solids with liquid;Clear throat intermittently Postural Changes and/or Swallow Maneuvers: Out of bed for meals;Seated upright 90 degrees;Upright 30-60 min after meal                General recommendations:   (Palliative Care f/u for GOC at SNF; Dietician) Oral Care Recommendations: Oral care BID;Oral care before and after PO;Staff/trained caregiver to provide oral care Follow Up Recommendations: Skilled nursing-short term rehab (<3 hours/day) Assistance recommended at discharge: Frequent or constant Supervision/Assistance SLP Visit Diagnosis: Dysphagia, oropharyngeal phase (R13.12) (baseline Cognitive decline/Dementia; new CVA w/ left sided weakness) Plan: Continue with current plan of care             Orinda Kenner, [place], [place]; [place] [phone] (ascom) Kobe Jansma  05/11/2021, 1:37 PM

## 2021-05-11 NOTE — TOC Progression Note (Signed)
Transition of Care [facility]) - Progression Note    Patient Details  Name: Michele Lambert MRN: [id number] Date of Birth: 1926-04-24  Transition [facility]) CM/SW [place], RN Phone Number: 05/11/2021, 12:11 PM  Clinical Narrative:   Spoke to the patient's son Gwyndolyn Saxon and they would like to have the patient transported via EMS instead of wheelchair Lucianne Lei as the patient is having difficulty sitting for long periods of time, I contacted the [facility] and let them know that she will be transported via EMS, I reached out to Dr Mack Guise to get approval to have the surgical patient DC without a BM and only having a Smear, She has had a suppository place, Awaiting to hear back from Surgeon,     Expected Discharge Plan: [place] Barriers to Discharge: Continued Medical Work up  Expected Discharge Plan and Services Expected Discharge Plan: [place] arrangements for the past 2 months:  (home place ) Expected Discharge Date: 05/11/21                                     Social Determinants of Health (SDOH) Interventions    Readmission Risk Interventions No flowsheet data found.

## 2021-05-14 ENCOUNTER — Inpatient Hospital Stay
Admission: EM | Admit: 2021-05-14 | Discharge: 2021-05-20 | DRG: 871 | Disposition: A | Discharge status: Skilled Nursing Facility | Payer: Medicare Other | Source: Skilled Nursing Facility | Attending: Student in an Organized Health Care Education/Training Program | Admitting: Student in an Organized Health Care Education/Training Program

## 2021-05-14 ENCOUNTER — Other Ambulatory Visit: Payer: Self-pay

## 2021-05-14 ENCOUNTER — Emergency Department: Payer: Medicare Other

## 2021-05-14 DIAGNOSIS — A09 Infectious gastroenteritis and colitis, unspecified: Secondary | ICD-10-CM | POA: Diagnosis present

## 2021-05-14 DIAGNOSIS — A419 Sepsis, unspecified organism: Principal | ICD-10-CM

## 2021-05-14 DIAGNOSIS — A498 Other bacterial infections of unspecified site: Secondary | ICD-10-CM | POA: Diagnosis not present

## 2021-05-14 DIAGNOSIS — I129 Hypertensive chronic kidney disease with stage 1 through stage 4 chronic kidney disease, or unspecified chronic kidney disease: Secondary | ICD-10-CM | POA: Diagnosis present

## 2021-05-14 DIAGNOSIS — I248 Other forms of acute ischemic heart disease: Secondary | ICD-10-CM | POA: Diagnosis present

## 2021-05-14 DIAGNOSIS — Z7189 Other specified counseling: Secondary | ICD-10-CM | POA: Diagnosis not present

## 2021-05-14 DIAGNOSIS — J690 Pneumonitis due to inhalation of food and vomit: Secondary | ICD-10-CM

## 2021-05-14 DIAGNOSIS — S72142D Displaced intertrochanteric fracture of left femur, subsequent encounter for closed fracture with routine healing: Secondary | ICD-10-CM | POA: Diagnosis not present

## 2021-05-14 DIAGNOSIS — E039 Hypothyroidism, unspecified: Secondary | ICD-10-CM

## 2021-05-14 DIAGNOSIS — F0150 Vascular dementia without behavioral disturbance: Secondary | ICD-10-CM | POA: Diagnosis not present

## 2021-05-14 DIAGNOSIS — F0280 Dementia in other diseases classified elsewhere without behavioral disturbance: Secondary | ICD-10-CM | POA: Diagnosis present

## 2021-05-14 DIAGNOSIS — B9620 Unspecified Escherichia coli [E. coli] as the cause of diseases classified elsewhere: Secondary | ICD-10-CM | POA: Diagnosis present

## 2021-05-14 DIAGNOSIS — R0602 Shortness of breath: Secondary | ICD-10-CM | POA: Diagnosis present

## 2021-05-14 DIAGNOSIS — R1310 Dysphagia, unspecified: Secondary | ICD-10-CM | POA: Diagnosis present

## 2021-05-14 DIAGNOSIS — D631 Anemia in chronic kidney disease: Secondary | ICD-10-CM | POA: Diagnosis present

## 2021-05-14 DIAGNOSIS — N1831 Chronic kidney disease, stage 3a: Secondary | ICD-10-CM

## 2021-05-14 DIAGNOSIS — F0153 Vascular dementia, unspecified severity, with mood disturbance: Secondary | ICD-10-CM | POA: Diagnosis not present

## 2021-05-14 DIAGNOSIS — I69191 Dysphagia following nontraumatic intracerebral hemorrhage: Secondary | ICD-10-CM

## 2021-05-14 DIAGNOSIS — Z823 Family history of stroke: Secondary | ICD-10-CM | POA: Diagnosis not present

## 2021-05-14 DIAGNOSIS — F0390 Unspecified dementia without behavioral disturbance: Secondary | ICD-10-CM | POA: Diagnosis present

## 2021-05-14 DIAGNOSIS — S0630AA Unspecified focal traumatic brain injury with loss of consciousness status unknown, initial encounter: Secondary | ICD-10-CM | POA: Diagnosis present

## 2021-05-14 DIAGNOSIS — Z66 Do not resuscitate: Secondary | ICD-10-CM | POA: Diagnosis present

## 2021-05-14 DIAGNOSIS — Z8781 Personal history of (healed) traumatic fracture: Secondary | ICD-10-CM

## 2021-05-14 DIAGNOSIS — R197 Diarrhea, unspecified: Secondary | ICD-10-CM | POA: Diagnosis not present

## 2021-05-14 DIAGNOSIS — D649 Anemia, unspecified: Secondary | ICD-10-CM | POA: Diagnosis not present

## 2021-05-14 DIAGNOSIS — R7989 Other specified abnormal findings of blood chemistry: Secondary | ICD-10-CM | POA: Diagnosis present

## 2021-05-14 DIAGNOSIS — I10 Essential (primary) hypertension: Secondary | ICD-10-CM

## 2021-05-14 DIAGNOSIS — Z20822 Contact with and (suspected) exposure to covid-19: Secondary | ICD-10-CM | POA: Diagnosis present

## 2021-05-14 DIAGNOSIS — E038 Other specified hypothyroidism: Secondary | ICD-10-CM | POA: Diagnosis not present

## 2021-05-14 DIAGNOSIS — R778 Other specified abnormalities of plasma proteins: Secondary | ICD-10-CM

## 2021-05-14 DIAGNOSIS — Z79899 Other long term (current) drug therapy: Secondary | ICD-10-CM

## 2021-05-14 DIAGNOSIS — W19XXXD Unspecified fall, subsequent encounter: Secondary | ICD-10-CM | POA: Diagnosis present

## 2021-05-14 DIAGNOSIS — Z79890 Hormone replacement therapy (postmenopausal): Secondary | ICD-10-CM | POA: Diagnosis not present

## 2021-05-14 DIAGNOSIS — S06300D Unspecified focal traumatic brain injury without loss of consciousness, subsequent encounter: Secondary | ICD-10-CM | POA: Diagnosis not present

## 2021-05-14 DIAGNOSIS — G309 Alzheimer's disease, unspecified: Secondary | ICD-10-CM | POA: Diagnosis present

## 2021-05-14 DIAGNOSIS — Z515 Encounter for palliative care: Secondary | ICD-10-CM | POA: Diagnosis not present

## 2021-05-14 HISTORY — DX: Malignant (primary) neoplasm, unspecified: C80.1

## 2021-05-14 HISTORY — DX: Essential (primary) hypertension: I10

## 2021-05-14 LAB — RESP PANEL BY RT-PCR (FLU A&B, COVID) ARPGX2
Influenza A by PCR: NEGATIVE
Influenza B by PCR: NEGATIVE
SARS Coronavirus 2 by RT PCR: NEGATIVE

## 2021-05-14 LAB — CBC WITH DIFFERENTIAL/PLATELET
Abs Immature Granulocytes: 0.21 10*3/uL — ABNORMAL HIGH (ref 0.00–0.07)
Basophils Absolute: 0.1 10*3/uL (ref 0.0–0.1)
Basophils Relative: 0 %
Eosinophils Absolute: 0.3 10*3/uL (ref 0.0–0.5)
Eosinophils Relative: 1 %
HCT: 29.5 % — ABNORMAL LOW (ref 36.0–46.0)
Hemoglobin: 9.6 g/dL — ABNORMAL LOW (ref 12.0–15.0)
Immature Granulocytes: 1 %
Lymphocytes Relative: 8 %
Lymphs Abs: 1.7 10*3/uL (ref 0.7–4.0)
MCH: 28.7 pg (ref 26.0–34.0)
MCHC: 32.5 g/dL (ref 30.0–36.0)
MCV: 88.1 fL (ref 80.0–100.0)
Monocytes Absolute: 1.3 10*3/uL — ABNORMAL HIGH (ref 0.1–1.0)
Monocytes Relative: 7 %
Neutro Abs: 17.1 10*3/uL — ABNORMAL HIGH (ref 1.7–7.7)
Neutrophils Relative %: 83 %
Platelets: 326 10*3/uL (ref 150–400)
RBC: 3.35 MIL/uL — ABNORMAL LOW (ref 3.87–5.11)
RDW: 14.6 % (ref 11.5–15.5)
WBC: 20.7 10*3/uL — ABNORMAL HIGH (ref 4.0–10.5)
nRBC: 0 % (ref 0.0–0.2)

## 2021-05-14 LAB — PROCALCITONIN: Procalcitonin: 0.15 ng/mL

## 2021-05-14 LAB — BASIC METABOLIC PANEL
Anion gap: 6 (ref 5–15)
BUN: 36 mg/dL — ABNORMAL HIGH (ref 8–23)
CO2: 26 mmol/L (ref 22–32)
Calcium: 7.8 mg/dL — ABNORMAL LOW (ref 8.9–10.3)
Chloride: 106 mmol/L (ref 98–111)
Creatinine, Ser: 0.97 mg/dL (ref 0.44–1.00)
GFR, Estimated: 55 mL/min — ABNORMAL LOW (ref >60)
Glucose, Bld: 129 mg/dL — ABNORMAL HIGH (ref 70–99)
Potassium: 3.9 mmol/L (ref 3.5–5.1)
Sodium: 138 mmol/L (ref 135–145)

## 2021-05-14 LAB — BRAIN NATRIURETIC PEPTIDE: B Natriuretic Peptide: 176 pg/mL — ABNORMAL HIGH (ref 0.0–100.0)

## 2021-05-14 LAB — TROPONIN I (HIGH SENSITIVITY)
Troponin I (High Sensitivity): 19 ng/L — ABNORMAL HIGH (ref <18)
Troponin I (High Sensitivity): 15 ng/L (ref <18)

## 2021-05-14 LAB — HEMOGLOBIN A1C
Hgb A1c MFr Bld: 5.3 % (ref 4.8–5.6)
Mean Plasma Glucose: 105.41 mg/dL

## 2021-05-14 LAB — LACTIC ACID, PLASMA
Lactic Acid, Venous: 1.3 mmol/L (ref 0.5–1.9)
Lactic Acid, Venous: 1.8 mmol/L (ref 0.5–1.9)

## 2021-05-14 MED ORDER — SODIUM CHLORIDE 0.9 % IV SOLN
3.0000 g | Freq: Four times a day (QID) | INTRAVENOUS | Status: DC
  Administered 2021-05-14 – 2021-05-18 (×14): 3 g via INTRAVENOUS
  Filled 2021-05-14: qty 3
  Filled 2021-05-14 (×15): qty 8
  Filled 2021-05-14: qty 3
  Filled 2021-05-14: qty 8

## 2021-05-14 MED ORDER — LACTATED RINGERS IV SOLN: INTRAVENOUS | Status: DC

## 2021-05-14 MED ORDER — CALCIUM CARB-CHOLECALCIFEROL 600-10 MG-MCG PO TABS: 1.0000 | ORAL_TABLET | Freq: Two times a day (BID) | ORAL | Status: DC

## 2021-05-14 MED ORDER — TRAZODONE HCL 50 MG PO TABS: 50.0000 mg | ORAL_TABLET | Freq: Every evening | ORAL | Status: DC | PRN

## 2021-05-14 MED ORDER — OYSTER SHELL CALCIUM/D3 500-5 MG-MCG PO TABS
1.0000 | ORAL_TABLET | Freq: Two times a day (BID) | ORAL | Status: DC
  Administered 2021-05-15 – 2021-05-20 (×11): 1 via ORAL
  Filled 2021-05-14 (×11): qty 1

## 2021-05-14 MED ORDER — ACETAMINOPHEN 325 MG PO TABS: 650.0000 mg | ORAL_TABLET | Freq: Four times a day (QID) | ORAL | Status: DC | PRN

## 2021-05-14 MED ORDER — LEVOTHYROXINE SODIUM 50 MCG PO TABS
125.0000 ug | ORAL_TABLET | Freq: Every morning | ORAL | Status: DC
  Administered 2021-05-16 – 2021-05-20 (×5): 125 ug via ORAL
  Filled 2021-05-14 (×5): qty 3

## 2021-05-14 MED ORDER — LACTATED RINGERS IV BOLUS
1000.0000 mL | Freq: Once | INTRAVENOUS | Status: AC
  Administered 2021-05-14: 1000 mL via INTRAVENOUS

## 2021-05-14 MED ORDER — ACETAMINOPHEN 650 MG RE SUPP: 650.0000 mg | Freq: Four times a day (QID) | RECTAL | Status: DC | PRN

## 2021-05-14 MED ORDER — TRAMADOL HCL 50 MG PO TABS: 50.0000 mg | ORAL_TABLET | Freq: Four times a day (QID) | ORAL | Status: DC | PRN

## 2021-05-14 MED ORDER — POLYETHYLENE GLYCOL 3350 17 G PO PACK: 17.0000 g | PACK | Freq: Every day | ORAL | Status: DC | PRN

## 2021-05-14 MED ORDER — SODIUM CHLORIDE 0.9 % IV SOLN
3.0000 g | Freq: Once | INTRAVENOUS | Status: AC
  Administered 2021-05-14: 3 g via INTRAVENOUS
  Filled 2021-05-14: qty 8

## 2021-05-14 MED ORDER — HYDRALAZINE HCL 20 MG/ML IJ SOLN
5.0000 mg | INTRAMUSCULAR | Status: DC | PRN
  Administered 2021-05-16: 5 mg via INTRAVENOUS
  Filled 2021-05-14: qty 1

## 2021-05-14 MED ORDER — ONDANSETRON HCL 4 MG/2ML IJ SOLN: 4.0000 mg | Freq: Three times a day (TID) | INTRAMUSCULAR | Status: DC | PRN

## 2021-05-14 NOTE — H&P (Signed)
History and Physical    Michele Lambert YQI:[id number] DOB: [birth year] [name]: 05/14/2021  Referring MD/NP/PA:   PCP: Dion Body, MD   Patient coming from:  The patient is coming from SNF  Chief Complaint: SOB  HPI: Michele Lambert is a 88 y.o. female with medical history significant of hypertension, hypothyroidism, dementia, CKD stage IIIa, anemia, recent surgery for left hip fracture, recent intracranial parenchymal hemorrhage, who presents SOB.  Patient has dementia, and cannot provide likely with medical history.  Per his son at the bedside, at normal baseline, patient recognizes her son sometimes, but most of the time pt is not orientated to place and time. Today, pt's mental status is close to baseline per her son. Patient was recently hospitalized from 2/3 - 2/7 due to left hip fracture and intracranial parenchymal hemorrhage after fall. She under went left hip surgery and is currently doing rehab in SNF. Pt was found to have SOB and cough with little mucus production in facility.  Patient has gurgling sound, and seem to have difficulty swallowing.  Does not seem to have chest pain.  No fever or chills.  No active nausea, vomiting, diarrhea noted.  Patient moves all extremities.  Data Reviewed and ED Course: pt was found to have WBC 20.7, negative COVID PCR, troponin 19 --> 15, BNP 176, renal function close to baseline, temperature normal, blood pressure 120/55, heart rate 98, 17, oxygen saturation 95% on room air, 100% on 1 L oxygen.  Chest x-ray showed increased retrocardiac density. Pt is admitted to tele bed as inpt  EKG: I have personally reviewed.  QTc 468, LAD, poor R wave progression, heart rate 99   Review of Systems: Could not reviewed accurately due to dementia  Allergy:  Allergies  Allergen Reactions   Codeine    Lescol [Fluvastatin Sodium]     Past Medical History:  Diagnosis Date   Dementia ([place])    Hypothyroidism     Past Surgical History:  Procedure  Laterality Date   INTRAMEDULLARY (IM) NAIL INTERTROCHANTERIC Left 05/07/2021   Procedure: INTRAMEDULLARY (IM) NAIL INTERTROCHANTRIC;  Surgeon: Thornton Park, MD;  Location: [facility];  Service: Orthopedics;  Laterality: Left;   ORIF HUMERUS FRACTURE Right 05/02/2018   Procedure: OPEN REDUCTION INTERNAL FIXATION (ORIF) DISTAL HUMERUS FRACTURE;  Surgeon: Hiram Gash, MD;  Location: [place];  Service: Orthopedics;  Laterality: Right;    Social History:  reports that she has never smoked. She has never used smokeless tobacco. She reports that she does not currently use alcohol. She reports that she does not use drugs.  Family History:  Family History  Problem Relation Age of Onset   Stroke Mother      Prior to Admission medications   Medication Sig Start Date End Date Taking? Authorizing Provider  acetaminophen (TYLENOL) 650 MG CR tablet Take 650 mg by mouth every 8 (eight) hours as needed. 12/24/14   [provider]  Calcium Carb-Cholecalciferol 600-10 MG-MCG TABS Take 1 tablet by mouth 2 (two) times daily.    [provider]  enoxaparin (LOVENOX) 40 MG/0.4ML injection Inject 0.4 mLs (40 mg total) into the skin daily. 05/11/21 06/11/21  Wouk, Ailene Rud, MD  hydrochlorothiazide (HYDRODIURIL) 25 MG tablet Take 25 mg by mouth daily. 04/20/21   [provider]  levothyroxine (SYNTHROID) 125 MCG tablet Take 125 mcg by mouth every morning. 04/20/21   [provider]  losartan (COZAAR) 50 MG tablet Take 50 mg by mouth every evening. 05/02/21   [provider]  polyethylene glycol (MIRALAX / GLYCOLAX) packet Take 17 g by mouth daily. Patient taking differently: Take 17 g by mouth daily as needed. 05/06/18   Elodia Florence., MD  traMADol (ULTRAM) 50 MG tablet Take 1 tablet (50 mg total) by mouth every 6 (six) hours as needed for moderate pain. 05/11/21   Wouk, Ailene Rud, MD  traZODone (DESYREL) 50 MG tablet Take 1 tablet (50 mg total) by mouth at bedtime as  needed for up to 30 days for sleep. 05/06/18 06/05/18  Elodia Florence., MD    Physical Exam: Vitals:   05/14/21 0941 05/14/21 0942 05/14/21 1312  BP:  (!) 120/55 114/64  Pulse: 98 95 86  Resp: 17  20  Temp: 98.5 F (36.9 C)    TempSrc: Oral    SpO2: 100% 100% 93%  Weight:  70 kg   Height:  5\' 6"  (1.676 m)    General: Not in acute distress HEENT:       Eyes: PERRL, EOMI, no scleral icterus.       ENT: No discharge from the ears and nose.       Neck: No JVD, no bruit, no mass felt. Heme: No neck lymph node enlargement. Cardiac: S1/S2, RRR, No murmurs, No gallops or rubs. Respiratory: has rhonchi bilaterally GI: Soft, nondistended, nontender, no organomegaly, BS present. GU: No hematuria Ext: No pitting leg edema bilaterally. 1+DP/PT pulse bilaterally. Musculoskeletal: No joint deformities, No joint redness or warmth, no limitation of ROM in spin. Skin: No rashes.  Neuro: Alert, seems to know her own name, seem to recognize her son, but not orientated to time and place. Cranial nerves II-XII grossly intact, moves all extremities  Psych: Patient is not psychotic, no suicidal or hemocidal ideation.  Labs on Admission: I have personally reviewed following labs and imaging studies  CBC: Recent Labs  Lab 05/08/21 0430 05/09/21 0529 05/10/21 0426 05/10/21 1055 05/11/21 0347 05/14/21 0951  WBC 12.6* 12.2* 14.3*  --  12.5* 20.7*  NEUTROABS  --   --   --   --   --  17.1*  HGB 8.4* 8.0* 7.3* 8.6* 9.8* 9.6*  HCT 25.7* 24.6* 23.1* 25.6* 30.7* 29.5*  MCV 86.5 87.5 89.5  --  89.5 88.1  PLT 171 189 207  --  214 809   Basic Metabolic Panel: Recent Labs  Lab 05/08/21 0430 05/09/21 0529 05/10/21 0426 05/11/21 0347 05/14/21 0951  NA 136 138 139 140 138  K 4.0 4.0 3.9 4.4 3.9  CL 104 106 109 110 106  CO2 26 26 26 26 26   GLUCOSE 125* 100* 120* 96 129*  BUN 28* 33* 33* 29* 36*  CREATININE 1.04* 1.10* 0.93 0.85 0.97  CALCIUM 7.8* 7.9* 7.9* 8.2* 7.8*   GFR: Estimated  Creatinine Clearance: 34.6 mL/min (by C-G formula based on SCr of 0.97 mg/dL). Liver Function Tests: No results for input(s): AST, ALT, ALKPHOS, BILITOT, PROT, ALBUMIN in the last 168 hours. No results for input(s): LIPASE, AMYLASE in the last 168 hours. No results for input(s): AMMONIA in the last 168 hours. Coagulation Profile: No results for input(s): INR, PROTIME in the last 168 hours. Cardiac Enzymes: No results for input(s): CKTOTAL, CKMB, CKMBINDEX, TROPONINI in the last 168 hours. BNP (last 3 results) No results for input(s): PROBNP in the last 8760 hours. HbA1C: No results for input(s): HGBA1C in the last 72 hours. CBG: No results for input(s): GLUCAP in the last 168 hours. Lipid Profile: No results  for input(s): CHOL, HDL, LDLCALC, TRIG, CHOLHDL, LDLDIRECT in the last 72 hours. Thyroid Function Tests: No results for input(s): TSH, T4TOTAL, FREET4, T3FREE, THYROIDAB in the last 72 hours. Anemia Panel: No results for input(s): VITAMINB12, FOLATE, FERRITIN, TIBC, IRON, RETICCTPCT in the last 72 hours. Urine analysis:    Component Value Date/Time   COLORURINE STRAW (A) 08/07/2020 1528   APPEARANCEUR CLEAR (A) 08/07/2020 1528   LABSPEC 1.005 08/07/2020 1528   PHURINE 8.0 08/07/2020 1528   GLUCOSEU NEGATIVE 08/07/2020 1528   HGBUR NEGATIVE 08/07/2020 [place] 08/07/2020 1528   KETONESUR NEGATIVE 08/07/2020 1528   PROTEINUR NEGATIVE 08/07/2020 1528   NITRITE NEGATIVE 08/07/2020 1528   LEUKOCYTESUR NEGATIVE 08/07/2020 1528   Sepsis Labs: @LABRCNTIP (procalcitonin:4,lacticidven:4) ) Recent Results (from the past 240 hour(s))  Resp Panel by RT-PCR (Flu A&B, Covid) Nasopharyngeal Swab     Status: None   Collection Time: 05/07/21  3:26 AM   Specimen: Nasopharyngeal Swab; Nasopharyngeal(NP) swabs in vial transport medium  Result Value Ref Range Status   SARS Coronavirus 2 by RT PCR NEGATIVE NEGATIVE Final    Comment: (NOTE) SARS-CoV-2 target nucleic acids  are NOT DETECTED.  The SARS-CoV-2 RNA is generally detectable in upper respiratory specimens during the acute phase of infection. The lowest concentration of SARS-CoV-2 viral copies this assay can detect is 138 copies/mL. A negative result does not preclude SARS-Cov-2 infection and should not be used as the sole basis for treatment or other patient management decisions. A negative result may occur with  improper specimen collection/handling, submission of specimen other than nasopharyngeal swab, presence of viral mutation(s) within the areas targeted by this assay, and inadequate number of viral copies(<138 copies/mL). A negative result must be combined with clinical observations, patient history, and epidemiological information. The expected result is Negative.  Fact Sheet for Patients:  EntrepreneurPulse.com.au  Fact Sheet for Healthcare Providers:  IncredibleEmployment.be  This test is no t yet approved or cleared by the [place] FDA and  has been authorized for detection and/or diagnosis of SARS-CoV-2 by FDA under an Emergency Use Authorization (EUA). This EUA will remain  in effect (meaning this test can be used) for the duration of the COVID-19 declaration under Section 564(b)(1) of the Act, 21 U.S.C.section 360bbb-3(b)(1), unless the authorization is terminated  or revoked sooner.       Influenza A by PCR NEGATIVE NEGATIVE Final   Influenza B by PCR NEGATIVE NEGATIVE Final    Comment: (NOTE) The Xpert Xpress SARS-CoV-2/FLU/RSV plus assay is intended as an aid in the diagnosis of influenza from Nasopharyngeal swab specimens and should not be used as a sole basis for treatment. Nasal washings and aspirates are unacceptable for Xpert Xpress SARS-CoV-2/FLU/RSV testing.  Fact Sheet for Patients: EntrepreneurPulse.com.au  Fact Sheet for Healthcare Providers: IncredibleEmployment.be  This test is  not yet approved or cleared by the [place] FDA and has been authorized for detection and/or diagnosis of SARS-CoV-2 by FDA under an Emergency Use Authorization (EUA). This EUA will remain in effect (meaning this test can be used) for the duration of the COVID-19 declaration under Section 564(b)(1) of the Act, 21 U.S.C. section 360bbb-3(b)(1), unless the authorization is terminated or revoked.  Performed at [facility], [address] [place], [place] [address]   Resp Panel by RT-PCR (Flu A&B, Covid) Nasopharyngeal Swab     Status: None   Collection Time: 05/10/21  6:00 PM   Specimen: Nasopharyngeal Swab; Nasopharyngeal(NP) swabs in vial transport medium  Result Value  Ref Range Status   SARS Coronavirus 2 by RT PCR NEGATIVE NEGATIVE Final    Comment: (NOTE) SARS-CoV-2 target nucleic acids are NOT DETECTED.  The SARS-CoV-2 RNA is generally detectable in upper respiratory specimens during the acute phase of infection. The lowest concentration of SARS-CoV-2 viral copies this assay can detect is 138 copies/mL. A negative result does not preclude SARS-Cov-2 infection and should not be used as the sole basis for treatment or other patient management decisions. A negative result may occur with  improper specimen collection/handling, submission of specimen other than nasopharyngeal swab, presence of viral mutation(s) within the areas targeted by this assay, and inadequate number of viral copies(<138 copies/mL). A negative result must be combined with clinical observations, patient history, and epidemiological information. The expected result is Negative.  Fact Sheet for Patients:  EntrepreneurPulse.com.au  Fact Sheet for Healthcare Providers:  IncredibleEmployment.be  This test is no t yet approved or cleared by the [place] FDA and  has been authorized for detection and/or diagnosis of SARS-CoV-2 by FDA under an Emergency Use  Authorization (EUA). This EUA will remain  in effect (meaning this test can be used) for the duration of the COVID-19 declaration under Section 564(b)(1) of the Act, 21 U.S.C.section 360bbb-3(b)(1), unless the authorization is terminated  or revoked sooner.       Influenza A by PCR NEGATIVE NEGATIVE Final   Influenza B by PCR NEGATIVE NEGATIVE Final    Comment: (NOTE) The Xpert Xpress SARS-CoV-2/FLU/RSV plus assay is intended as an aid in the diagnosis of influenza from Nasopharyngeal swab specimens and should not be used as a sole basis for treatment. Nasal washings and aspirates are unacceptable for Xpert Xpress SARS-CoV-2/FLU/RSV testing.  Fact Sheet for Patients: EntrepreneurPulse.com.au  Fact Sheet for Healthcare Providers: IncredibleEmployment.be  This test is not yet approved or cleared by the [place] FDA and has been authorized for detection and/or diagnosis of SARS-CoV-2 by FDA under an Emergency Use Authorization (EUA). This EUA will remain in effect (meaning this test can be used) for the duration of the COVID-19 declaration under Section 564(b)(1) of the Act, 21 U.S.C. section 360bbb-3(b)(1), unless the authorization is terminated or revoked.  Performed at [facility], [address] [place], [place] [address]   Resp Panel by RT-PCR (Flu A&B, Covid) Nasopharyngeal Swab     Status: None   Collection Time: 05/14/21  9:51 AM   Specimen: Nasopharyngeal Swab; Nasopharyngeal(NP) swabs in vial transport medium  Result Value Ref Range Status   SARS Coronavirus 2 by RT PCR NEGATIVE NEGATIVE Final    Comment: (NOTE) SARS-CoV-2 target nucleic acids are NOT DETECTED.  The SARS-CoV-2 RNA is generally detectable in upper respiratory specimens during the acute phase of infection. The lowest concentration of SARS-CoV-2 viral copies this assay can detect is 138 copies/mL. A negative result does not preclude  SARS-Cov-2 infection and should not be used as the sole basis for treatment or other patient management decisions. A negative result may occur with  improper specimen collection/handling, submission of specimen other than nasopharyngeal swab, presence of viral mutation(s) within the areas targeted by this assay, and inadequate number of viral copies(<138 copies/mL). A negative result must be combined with clinical observations, patient history, and epidemiological information. The expected result is Negative.  Fact Sheet for Patients:  EntrepreneurPulse.com.au  Fact Sheet for Healthcare Providers:  IncredibleEmployment.be  This test is no t yet approved or cleared by the [place] FDA and  has been authorized for detection and/or diagnosis  of SARS-CoV-2 by FDA under an Emergency Use Authorization (EUA). This EUA will remain  in effect (meaning this test can be used) for the duration of the COVID-19 declaration under Section 564(b)(1) of the Act, 21 U.S.C.section 360bbb-3(b)(1), unless the authorization is terminated  or revoked sooner.       Influenza A by PCR NEGATIVE NEGATIVE Final   Influenza B by PCR NEGATIVE NEGATIVE Final    Comment: (NOTE) The Xpert Xpress SARS-CoV-2/FLU/RSV plus assay is intended as an aid in the diagnosis of influenza from Nasopharyngeal swab specimens and should not be used as a sole basis for treatment. Nasal washings and aspirates are unacceptable for Xpert Xpress SARS-CoV-2/FLU/RSV testing.  Fact Sheet for Patients: EntrepreneurPulse.com.au  Fact Sheet for Healthcare Providers: IncredibleEmployment.be  This test is not yet approved or cleared by the [place] FDA and has been authorized for detection and/or diagnosis of SARS-CoV-2 by FDA under an Emergency Use Authorization (EUA). This EUA will remain in effect (meaning this test can be used) for the duration of  the COVID-19 declaration under Section 564(b)(1) of the Act, 21 U.S.C. section 360bbb-3(b)(1), unless the authorization is terminated or revoked.  Performed at [facility], [address], [place], [place] [address]      Radiological Exams on Admission: DG Chest 2 View  Result Date: 05/14/2021 CLINICAL DATA:  Cough EXAM: CHEST - 2 VIEW COMPARISON:  05/07/2021 FINDINGS: Stable cardiomegaly. Chronic interstitial changes. Increased retrocardiac density probably reflects atelectasis. There may be hiatal hernia. No pleural effusion. IMPRESSION: Increased retrocardiac density probably reflects atelectasis. There may be a hiatal hernia. Electronically Signed   By: Macy Mis M.D.   On: 05/14/2021 10:27      Assessment/Plan Principal Problem:   Aspiration pneumonia (HCC) Active Problems:   Hypothyroidism   Dementia ([place])   Traumatic intraparenchymal hemorrhage   Essential hypertension   History of fracture of left hip   Sepsis (HCC)   Elevated troponin   Chronic kidney disease, stage 3a (HCC)   Normocytic anemia  Sepsis due to aspiration pneumonia [facility]): Patient has sepsis with WBC 20.7 and heart rate 98, pending lactic acid level.  - Will admit to med-surg bed as inpt - IV Unasyn - Mucinex for cough  - Bronchodilators - Follow up blood culture x2, sputum culture - will get Procalcitonin and trend lactic acid level per sepsis protocol - IVF: 1L of LR, then 75 cc/h - SLP  Hypothyroidism -Synthroid  Dementia ([place]) -Fall precaution  Traumatic intraparenchymal hemorrhage -Frequent neurochecks  Essential hypertension -IV hydralazine as needed -hold HCTZ and Cozaar due to sepsis and high risk of developing hypotension  History of fracture of left hip -As needed Tylenol and tramadol  Elevated troponin: Troponin level 19 --> 15, already normalized.  No chest pain, likely due to demand ischemia. -Check A1c, FLP  Chronic kidney disease, stage 3a ([place]):  Stable, creatinine 0.97 -Follow-up with BMP  Normocytic anemia: Hemoglobin stable, 9.6 (9.8 on 05/11/2021) -Follow-up with CBC          DVT ppx: SCD  Code Status: DNR per her son (pt has DNR form with her)  Family Communication:  Yes, patient's son at bed side.      Disposition Plan:  Anticipate discharge back to previous environment, SNF  Consults called:  none  Admission status and Level of care: Med-Surg:  as inpt     Severity of Illness:  The appropriate patient status for this patient is INPATIENT. Inpatient status is judged to be  reasonable and necessary in order to provide the required intensity of service to ensure the patient's safety. The patient's presenting symptoms, physical exam findings, and initial radiographic and laboratory data in the context of their chronic comorbidities is felt to place them at high risk for further clinical deterioration. Furthermore, it is not anticipated that the patient will be medically stable for discharge from the hospital within 2 midnights of admission.   * I certify that at the point of admission it is my clinical judgment that the patient will require inpatient hospital care spanning beyond 2 midnights from the point of admission due to high intensity of service, high risk for further deterioration and high frequency of surveillance required.*       Date of Service 05/14/2021    Ivor Costa [facility]   If 7PM-7AM, please contact night-coverage [email or web address] 05/14/2021, 1:30 PM

## 2021-05-14 NOTE — Progress Notes (Signed)
OT Cancellation Note  Patient Details Name: ZADIA UHDE MRN: [id number] DOB: 11-09-1926   Cancelled Treatment:    Reason Eval/Treat Not Completed: Medical issues which prohibited therapy. Hold therapy this date per MD 2/2 SOB. Will re-attempt OT evaluation next date as medically appropriate.   Ardeth Perfect., MPH, MS, OTR/L ascom [phone] 05/14/21, 3:54 PM

## 2021-05-14 NOTE — Consult Note (Signed)
Pharmacy Antibiotic Note  Michele Lambert is a 88 y.o. female admitted on 05/14/2021 with  aspiration pneumonia .  Pharmacy has been consulted for Unasyn dosing.  Plan: Unasyn 3g IV Q6 hours  Height: 5\' 6"  (167.6 cm) Weight: 66.3 kg (146 lb 2.6 oz) IBW/kg (Calculated) : 59.3  Temp (24hrs), Avg:98.5 F (36.9 C), Min:98.5 F (36.9 C), Max:98.5 F (36.9 C)  Recent Labs  Lab 05/08/21 0430 05/09/21 0529 05/10/21 0426 05/11/21 0347 05/14/21 0951  WBC 12.6* 12.2* 14.3* 12.5* 20.7*  CREATININE 1.04* 1.10* 0.93 0.85 0.97    Estimated Creatinine Clearance: 34.6 mL/min (by C-G formula based on SCr of 0.97 mg/dL).    Allergies  Allergen Reactions   Codeine    Lescol [Fluvastatin Sodium]     Antimicrobials this admission: Unasyn 2/10 >>   Microbiology results: 2/10 BCx: pending 2/10 Sputum: pending   Thank you for allowing pharmacy to be a part of this patients care.  Lance Coon A Owens Hara 05/14/2021 4:03 PM

## 2021-05-14 NOTE — Progress Notes (Signed)
SLP Cancellation Note  Patient Details Name: Michele Lambert MRN: [id number] DOB: 04-Mar-1927   Cancelled treatment:       Reason Eval/Treat Not Completed: Fatigue/lethargy limiting ability to participate;Patient not medically ready (chart reviewed; consulted Son in room and [place]) ST services will f/u tomorrow w/ BSE. Pt was just discharged on a dysphagia level 1 (puree) diet w/ Nectar liquids by TSP and aspiration precautions 3 days ago. Pt is at high risk for aspiration in current presentation. Recommend NPO status including meds until BSE is completed and recommendations made. Recommend frequent oral care for hygiene and stimulation of swallowing. MD/NSG updated. Son informed and agreed.      Orinda Kenner, MS, CCC-SLP Speech Language Pathologist Rehab Services; [place] [phone] (ascom) Pegah Segel 05/14/2021, 3:24 PM

## 2021-05-14 NOTE — Progress Notes (Signed)
PT Cancellation Note  Patient Details Name: HELAINE YACKEL MRN: [id number] DOB: 02-28-1927   Cancelled Treatment:    Reason Eval/Treat Not Completed: Other (comment) PT orders received, chart reviewed. MD requests to hold PT evaluation on this date until pt is less SOB. Will attempt to see pt tomorrow. PT contacted pt's son Jiayi Lengacher) re: pt's CLOF with [facility] Saxon reporting he's hopeful pt will become ambulatory again as pt was ambulatory without AD living with 24 hr supervision until November when pt transitioned to [facility] & she began ambulating with RW. PT explained pt's new learning is significantly impaired by pt's dementia (as pt is only AxO to self at baseline) & Gwyndolyn Saxon voiced understanding. Will attempt to see pt tomorrow.  Lavone Nian, PT, DPT 05/14/21, 3:45 PM   Waunita Schooner 05/14/2021, 3:44 PM

## 2021-05-14 NOTE — ED Provider Notes (Signed)
[facility] Provider Note    Event Date/Time   First MD Initiated Contact with Patient 05/14/21 [phone]     (approximate)   History   Chief Complaint Shortness of Breath (Sob, suctioning???, previous hip fracture and cva history )   HPI  Michele Lambert is a 88 y.o. female with past medical history of hypertension, hypothyroidism, hemorrhagic stroke, and Alzheimer's dementia who presents to the ED for shortness of breath.  History is limited due to patient's baseline dementia.  Per EMS, patient has noted to be coughing with occasional gurgling respirations over the past couple of days.  She has seem to have difficulty swallowing with feeds per staff at the facility.  She was recently admitted to the hospital for left hip fracture and hemorrhagic stroke, noted to have dysphagia following stroke and was discharged from the hospital 3 days ago.  Patient reports cough at this time but denies any shortness of breath or pain, specifically denies any chest pain.  She has not had any fevers and reportedly had O2 sats of 91% on room air per EMS.     Physical Exam   Triage Vital Signs: ED Triage Vitals  Enc Vitals Group     BP 05/14/21 0942 (!) 120/55     Pulse Rate 05/14/21 0941 98     Resp 05/14/21 0941 17     Temp 05/14/21 0941 98.5 F (36.9 C)     Temp Source 05/14/21 0941 Oral     SpO2 05/14/21 0941 100 %     Weight 05/14/21 0942 154 lb 5.2 oz (70 kg)     Height 05/14/21 0942 5\' 6"  (1.676 m)     Head Circumference --      Peak Flow --      Pain Score 05/14/21 0942 3     Pain Loc --      Pain Edu? --      Excl. in [place]? --     Most recent vital signs: Vitals:   05/14/21 0941 05/14/21 0942  BP:  (!) 120/55  Pulse: 98 95  Resp: 17   Temp: 98.5 F (36.9 C)   SpO2: 100% 100%    Constitutional: Alert and oriented. Eyes: Conjunctivae are normal. Head: Atraumatic. Nose: No congestion/rhinnorhea. Mouth/Throat: Mucous membranes are moist.  Cardiovascular:  Normal rate, regular rhythm. Grossly normal heart sounds.  2+ radial pulses bilaterally. Respiratory: Normal respiratory effort.  No retractions. Lungs with mild crackles to bilateral bases. Gastrointestinal: Soft and nontender. No distention. Musculoskeletal: No lower extremity tenderness, 1+ pitting edema to mid shins bilaterally.  Surgical site to left hip is clean, dry, and intact with no erythema or warmth. Neurologic:  Normal speech and language. No gross focal neurologic deficits are appreciated.    ED Results / Procedures / Treatments   Labs (all labs ordered are listed, but only abnormal results are displayed) Labs Reviewed  CBC WITH DIFFERENTIAL/PLATELET - Abnormal; Notable for the following components:      Result Value   WBC 20.7 (*)    RBC 3.35 (*)    Hemoglobin 9.6 (*)    HCT 29.5 (*)    Neutro Abs 17.1 (*)    Monocytes Absolute 1.3 (*)    Abs Immature Granulocytes 0.21 (*)    All other components within normal limits  BASIC METABOLIC PANEL - Abnormal; Notable for the following components:   Glucose, Bld 129 (*)    BUN 36 (*)    Calcium 7.8 (*)  GFR, Estimated 55 (*)    All other components within normal limits  BRAIN NATRIURETIC PEPTIDE - Abnormal; Notable for the following components:   B Natriuretic Peptide 176.0 (*)    All other components within normal limits  TROPONIN I (HIGH SENSITIVITY) - Abnormal; Notable for the following components:   Troponin I (High Sensitivity) 19 (*)    All other components within normal limits  RESP PANEL BY RT-PCR (FLU A&B, COVID) [id number]  PROCALCITONIN     EKG  ED ECG REPORT I, Blake Divine, the attending physician, personally viewed and interpreted this ECG.   Date: 05/14/2021  EKG Time: 9:46  Rate: 97  Rhythm: normal sinus rhythm, PAC's noted  Axis: LAD  Intervals:none  ST&T Change: None  RADIOLOGY Chest x-ray reviewed by me with retrocardiac opacity, no edema or effusion noted.  PROCEDURES:  Critical Care  performed: No  .1-3 Lead EKG Interpretation Performed by: Blake Divine, MD Authorized by: Blake Divine, MD     Interpretation: normal     ECG rate:  75-90   ECG rate assessment: normal     Rhythm: sinus rhythm     Ectopy: PAC     Conduction: normal     MEDICATIONS ORDERED IN ED: Medications  Ampicillin-Sulbactam (UNASYN) 3 g in sodium chloride 0.9 % 100 mL IVPB (has no administration in time range)  lactated ringers bolus 1,000 mL (1,000 mLs Intravenous New Bag/Given 05/14/21 1049)     IMPRESSION / MDM / ASSESSMENT AND PLAN / ED COURSE  I reviewed the triage vital signs and the nursing notes.                              88 y.o. female with past medical history of hypertension, hypothyroidism, Alzheimer's dementia, and recent hemorrhagic stroke who presents to the ED complaining of cough with occasional gurgling respiratory symptoms at her nursing facility.  Differential diagnosis includes, but is not limited to, aspiration, pneumonia, COVID-19, pulmonary edema, CHF, ACS, and PE.  Patient is nontoxic-appearing and in no acute distress, appears to be at her baseline mental status and complains only of a cough.  She is maintaining her O2 sats on room air, does have some mild lower extremity edema along with crackles to bilateral bases.  We will further assess with x-ray as she is high risk for aspiration, will also check for evidence of CHF.  Given she is at her baseline mental status with no new focal neurologic deficits, do not feel repeat imaging of her head is indicated at this time.  She is high risk for PE given recent orthopedic procedure, but with reassuring vital signs and no chest pain or shortness of breath, low suspicion for this currently.  The patient is on the cardiac monitor to evaluate for evidence of arrhythmia and/or significant heart rate changes.  Labs remarkable for elevated BUN to creatinine ratio consistent with dehydration, we will give IV fluid bolus.   Patient noted to have stable hemoglobin but marked leukocytosis at 20 with retrocardiac opacity noted on chest x-ray.  Clinically I am concerned for aspiration event and we will treat with Unasyn.  Patient's troponin noted to be mildly elevated and we will trend.  Patient's respiratory effort seems to be fluctuating as she will occasionally breathe as fast as 28 times per minute but now breathing around 18 times per minute.  She continues to maintain oxygen saturations on room air, case discussed with hospitalist  for admission.      FINAL CLINICAL IMPRESSION(S) / ED DIAGNOSES   Final diagnoses:  Aspiration pneumonia, unspecified aspiration pneumonia type, unspecified laterality, unspecified part of lung ([place])     Rx / DC Orders   ED Discharge Orders     None        Note:  This document was prepared using Dragon voice recognition software and may include unintentional dictation errors.   Blake Divine, MD 05/14/21 [phone]

## 2021-05-14 NOTE — ED Triage Notes (Signed)
8 days prior stroke and fell hip fracture , pty increased sob, and swallowing difficulty ,

## 2021-05-15 DIAGNOSIS — R197 Diarrhea, unspecified: Secondary | ICD-10-CM | POA: Diagnosis not present

## 2021-05-15 DIAGNOSIS — J690 Pneumonitis due to inhalation of food and vomit: Secondary | ICD-10-CM | POA: Diagnosis not present

## 2021-05-15 DIAGNOSIS — N1831 Chronic kidney disease, stage 3a: Secondary | ICD-10-CM | POA: Diagnosis not present

## 2021-05-15 LAB — CBC
HCT: 33.6 % — ABNORMAL LOW (ref 36.0–46.0)
Hemoglobin: 10.7 g/dL — ABNORMAL LOW (ref 12.0–15.0)
MCH: 28.4 pg (ref 26.0–34.0)
MCHC: 31.8 g/dL (ref 30.0–36.0)
MCV: 89.1 fL (ref 80.0–100.0)
Platelets: 351 10*3/uL (ref 150–400)
RBC: 3.77 MIL/uL — ABNORMAL LOW (ref 3.87–5.11)
RDW: 14.6 % (ref 11.5–15.5)
WBC: 21.5 10*3/uL — ABNORMAL HIGH (ref 4.0–10.5)
nRBC: 0 % (ref 0.0–0.2)

## 2021-05-15 LAB — LIPID PANEL
Cholesterol: 175 mg/dL (ref 0–200)
HDL: 35 mg/dL — ABNORMAL LOW (ref >40)
LDL Cholesterol: 125 mg/dL — ABNORMAL HIGH (ref 0–99)
Total CHOL/HDL Ratio: 5 RATIO
Triglycerides: 77 mg/dL (ref <150)
VLDL: 15 mg/dL (ref 0–40)

## 2021-05-15 MED ORDER — CHLORHEXIDINE GLUCONATE CLOTH 2 % EX PADS
6.0000 | MEDICATED_PAD | Freq: Every day | CUTANEOUS | Status: DC
  Administered 2021-05-15 – 2021-05-20 (×6): 6 via TOPICAL

## 2021-05-15 NOTE — Assessment & Plan Note (Signed)
Continue levothyroxine 

## 2021-05-15 NOTE — Assessment & Plan Note (Addendum)
Sustained in fall which also caused her hip fracture. No significant neurologic symptoms or complications, but she does seem to keep head turned to left. Avoid anticoagulants.   SCDs for VTE prophylaxis. With underlying dementia, expect cognition may worsen or not recover to prior baseline.

## 2021-05-15 NOTE — Assessment & Plan Note (Addendum)
Repaired prior admission, discharged to SNF on 2/7.  Continue PT and OT.  Plan is to return to SNF for rehab.  Fall precautions.  Pain control as needed but avoid narcotics as much as possible given dementia and risk for delirium.

## 2021-05-15 NOTE — Assessment & Plan Note (Signed)
Likely due to demand ischemia in setting of respiratory distress following aspiration event.  Initial troponins were 19 and later 15.  Patient without apparent chest pain.

## 2021-05-15 NOTE — Assessment & Plan Note (Addendum)
Home HCTZ and losartan were held on admission in the setting of sepsis and risk of hypotension.   BP is stable, mildly elevated. --Resumed losartan --Continue holding HCTZ for now --Continue PRN hydralazine

## 2021-05-15 NOTE — Assessment & Plan Note (Addendum)
Renal function near baseline.   Monitor BMP off fluids.

## 2021-05-15 NOTE — Progress Notes (Signed)
Last bladder scan done about 1700, it was 82 ml, Not able to collected urine or stool sample, I will let next shift know.

## 2021-05-15 NOTE — Assessment & Plan Note (Addendum)
Present on admission with leukocytosis, tachycardia and tachypnea in the setting of aspiration episode and pneumonitis/pneumonia. Sepsis physiology has resolved. Monitor hemodynamics.  Continue antibiotics to complete course for aspiration.

## 2021-05-15 NOTE — Assessment & Plan Note (Addendum)
Transition Unasyn >> Augmentin to complete 5 day course. . Seen by SLP for swallow evaluation and started on dysphagia 1 (pured) diet with nectar thick liquid by spoon, meds crushed in pure. Monitor respiratory and oxygen status, supplemental O2 if sats below 90% on room air. Supportive care.

## 2021-05-15 NOTE — Evaluation (Signed)
Occupational Therapy Evaluation Patient Details Name: Michele Lambert MRN: [id number] DOB: 1927/02/12 Today's Date: 05/15/2021   History of Present Illness Pt is a 88 y/o F admitted on 05/14/21 with c/c of SOB. Pt is being treated for sepsis 2/2 aspiration PNA. Pt with recent hospitalization following a fall resulting in L intertrochanteric vx & L IM nail fixation on 05/07/21 by Dr. 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 PMH: HTN, hypothryoidism, dementia, CKD stage 3A   Clinical Impression   Pt was seen for OT evaluation and co-tx with PT this date. Prior to hospital admission, pt was recently and only shortly at rehab after recent admission for fall and resulting L hip fracture s/p repair. Son present for start of session and hopeful pt will be able to return to rehab to get better. Pt received in bed, lethargic, and not oriented. Requires moderate verbal cues for 1 step commands/sequencing/initiation of tasks/and continuation of tasks to completion. Pt noted to have had a liquid BM in bed and unable to verbalize need for assist. Family unaware. Pt required TOTAL A +2-3 for full linens change, pericare, and gown change. Pt endorsed R shoulder pain with movement and denied L hip pain. Currently pt demonstrates impairments in strength, pain, cognition, and balance as described below (See OT problem list) which functionally limit her ability to perform ADL/self-care tasks. Pt may benefit from skilled OT services to address noted impairments and functional limitations (see below for any additional details) in order to maximize safety and independence while minimizing falls risk and caregiver burden. Upon hospital discharge, recommend STR to maximize pt safety and return to PLOF.     Recommendations for follow up therapy are one component of a multi-disciplinary discharge planning process,  led by the attending physician.  Recommendations may be updated based on patient status, additional functional criteria and insurance authorization.   Follow Up Recommendations  Skilled nursing-short term rehab (<3 hours/day)    Assistance Recommended at Discharge    Patient can return home with the following Two people to help with walking and/or transfers;A lot of help with bathing/dressing/bathroom;Help with stairs or ramp for entrance;Assistance with feeding;Direct supervision/assist for medications management;Assist for transportation;Assistance with cooking/housework    Functional Status Assessment  Patient has had a recent decline in their functional status and/or demonstrates limited ability to make significant improvements in function in a reasonable and predictable amount of time  Equipment Recommendations  BSC/3in1    Recommendations for Other Services       Precautions / Restrictions Precautions Precautions: Fall Restrictions Weight Bearing Restrictions: Yes LLE Weight Bearing: Weight bearing as tolerated      Mobility Bed Mobility Overal bed mobility: Needs Assistance Bed Mobility: Rolling Rolling: Total assist, +2 for physical assistance (therapists attempt to cue/place hands on bed rail to allow pt to assist with positioning but pt begins pushing; pt with behaviors demonstrating pain when lying on L side)              Transfers                   General transfer comment: unsafe to attemt at this time      Balance  ADL either performed or assessed with clinical judgement   ADL Overall ADL's : Needs assistance/impaired     Grooming: Bed level;Wash/dry face;Wash/dry hands;Moderate assistance Grooming Details (indicate cue type and reason): VC for sequencing/initiating/continueing tasks, and ultimately requiring MOD A to complete                     Toileting- Clothing  Manipulation and Hygiene: Bed level;Total assistance;+2 for physical assistance Toileting - Clothing Manipulation Details (indicate cue type and reason): +1 assist for bed mobility to maintain sidelying, +2 assist for pericare and full linens change             Vision         Perception     Praxis      Pertinent Vitals/Pain Pain Assessment Pain Assessment: Faces Faces Pain Scale: Hurts little more Pain Location: R shoulder with touching/movement, L hip with PT performing PROM Pain Intervention(s): Limited activity within patient's tolerance, Monitored during session, Repositioned     Hand Dominance Right   Extremity/Trunk Assessment Upper Extremity Assessment Upper Extremity Assessment: Generalized weakness;Difficult to assess due to impaired cognition;RUE deficits/detail RUE Deficits / Details: Pt c/o R shoulder pain with touch/movement. LUE Deficits / Details: Pt c/o R shoulder pain with touch/movement.   Lower Extremity Assessment Lower Extremity Assessment: Generalized weakness;Difficult to assess due to impaired cognition;LLE deficits/detail (PT performs LLE hip PROM & pt WNL but does grimace at end range & endorses pain when rolled to L side, resistant to hip abduction when PT performs it passively) LLE Deficits / Details: recent hip fx s/p IM nail       Communication Communication Communication: No difficulties   Cognition Arousal/Alertness: Lethargic (pt keeps eyes closed throughout majority of session even with staff performs peri hygiene) Behavior During Therapy: WFL for tasks assessed/performed, Flat affect Overall Cognitive Status: History of cognitive impairments - at baseline                                 General Comments: Pt with hx of dementia & only oriented to self at baseline. During session pt responds to her name but unable to state full name when asked.     General Comments       Exercises     Shoulder Instructions       Home Living Family/patient expects to be discharged to:: Skilled nursing facility                                 Additional Comments: Prior to initial admission pt was at [facility] (ambulatory with RW) but following hip fx pt went to SNF rehab.      Prior Functioning/Environment               Mobility Comments: Pt was +2 assist bed level during last admission for hip fx.          OT Problem List: Decreased strength;Decreased activity tolerance;Decreased cognition;Decreased knowledge of use of DME or AE;Impaired UE functional use;Decreased range of motion;Impaired balance (sitting and/or standing);Decreased safety awareness;Decreased knowledge of precautions;Pain      OT Treatment/Interventions: Self-care/ADL training;Energy conservation;Modalities;Balance training;Therapeutic exercise;DME and/or AE instruction;Patient/family education;Therapeutic activities;Neuromuscular education;Cognitive remediation/compensation    OT Goals(Current goals can be found in the care plan section) Acute Rehab OT Goals Patient Stated Goal: son wishes pt to go to rehab, pt  unable to state goals OT Goal Formulation: With family Time For Goal Achievement: 05/29/21 Potential to Achieve Goals: Fair ADL Goals Pt Will Perform Grooming: bed level;with set-up;with supervision Additional ADL Goal #1: Pt will participate in bed mobility requiring MOD A for rolling and MIN VC for sequencing/initiating movement for bed level ADL Additional ADL Goal #2: Pt will perform bed mobility sup<>sit with MOD-MAX A +VC for sequencing/initiation.  OT Frequency: Min 2X/week    Co-evaluation PT/OT/SLP Co-Evaluation/Treatment: Yes Reason for Co-Treatment: Complexity of the patient's impairments (multi-system involvement);Necessary to address cognition/behavior during functional activity;For patient/therapist safety PT goals addressed during session: Mobility/safety with mobility OT goals addressed  during session: ADL's and self-care      AM-PAC OT "6 Clicks" Daily Activity     Outcome Measure Help from another person eating meals?: A Lot Help from another person taking care of personal grooming?: Total Help from another person toileting, which includes using toliet, bedpan, or urinal?: Total Help from another person bathing (including washing, rinsing, drying)?: A Lot Help from another person to put on and taking off regular upper body clothing?: A Lot Help from another person to put on and taking off regular lower body clothing?: Total 6 Click Score: 9   End of Session Nurse Communication: Mobility status  Activity Tolerance: Patient tolerated treatment well Patient left: in bed;with call bell/phone within reach;with bed alarm set;with family/visitor present  OT Visit Diagnosis: History of falling (Z91.81);Other abnormalities of gait and mobility (R26.89);Other symptoms and signs involving cognitive function;Muscle weakness (generalized) (M62.81);Pain Pain - Right/Left: Right Pain - part of body: Shoulder                Time: 4967-5916 OT Time Calculation (min): 26 min Charges:  OT General Charges $OT Visit: 1 Visit OT Evaluation $OT Eval Moderate Complexity: 1 Mod OT Treatments $Self Care/Home Management : 8-22 mins  Ardeth Perfect., MPH, MS, OTR/L ascom [phone] 05/15/21, 1:07 PM

## 2021-05-15 NOTE — Progress Notes (Signed)
Progress Note   Patient: Michele Lambert:[id number] DOB: [birth year] [name]: 05/14/2021     1 DOS: the patient was seen and examined on 05/15/2021   Brief hospital course: Michele Lambert is a 88 y.o. female with medical history significant of hypertension, hypothyroidism, dementia, CKD stage IIIa, anemia, recent surgery for left hip fracture, recent intracranial parenchymal hemorrhage, who presented on 05/14/2021 with shortness of breath and wet sounding cough after an aspiration event at SNF.    Of note, pt was just discharged to rehab on 2/7 after admission for hip fracture after a fall at her ALF/memory care.  She also had an intracranial parenchymal hemorrhage sustained in that fall but stable neurologic status with no need for intervention.    Admitted and being treated with Unasyn for aspiration pneumonia.  Assessment and Plan: * Aspiration pneumonia ([place])- (present on admission) Continue Unasyn. Seen by SLP for swallow evaluation and started on dysphagia 1 (pured) diet with nectar thick liquid by spoon, meds crushed in pure. Monitor respiratory and oxygen status, supplemental O2 if sats below 90% on room air. Supportive care.  Sepsis ([place])- (present on admission) Present on admission with leukocytosis, tachycardia and tachypnea in the setting of aspiration episode and pneumonitis/pneumonia. Continue IV antibiotics and follow cultures. Sepsis physiology has improved.  Diarrhea Present on admission. C. difficile and GI panel are pending.   Enteric precautions for now.   Low suspicion that this is infectious.   Monitor electrolytes and hydration status.  History of fracture of left hip Repaired prior admission, discharged to SNF on 2/7.  Continue PT and OT.  Anticipate return to SNF if patient is able to participate in therapy.  Fall precautions.  Pain control as needed but avoid narcotics as much as possible given dementia and risk for delirium.  Traumatic intraparenchymal  hemorrhage- (present on admission) Sustained in fall which also caused her hip fracture. No neurologic symptoms or complications. Monitor.  Avoid anticoagulants.  SCDs for VTE prophylaxis.  Elevated troponin- (present on admission) Likely due to demand ischemia in setting of respiratory distress following aspiration event.  Initial troponins were 19 and later 15.  Patient without apparent chest pain.  Chronic kidney disease, stage 3a ([place])- (present on admission) Renal function near baseline.  Monitor BMP  Essential hypertension- (present on admission) Home HCTZ and losartan were held on admission in the setting of sepsis and risk of hypotension.  Continue IV hydralazine as needed. BP is stable.  Normocytic anemia- (present on admission) Hemoglobin stable.  Monitor CBC  Hypothyroidism- (present on admission) Continue levothyroxine  Dementia ([place])- (present on admission) Patient resides at ALF memory care unit. Delirium and fall precautions.        Subjective: Patient seen with son Telford Nab at bedside today.  Patient was sleeping but woke easily to voice.  She denies feeling short of breath says she feels okay.  Denies hip pain at this time.  No other acute events or complaints reported  Physical Exam: Vitals:   05/14/21 1312 05/14/21 1400 05/14/21 2204 05/15/21 0352  BP: 114/64  127/76 135/63  Pulse: 86  89 74  Resp: 20  18 18   Temp:   99.4 F (37.4 C) 98.1 F (36.7 C)  TempSrc:   Oral Oral  SpO2: 93%  95% 96%  Weight:  66.3 kg    Height:  5\' 6"  (1.676 m)     General exam: Sleeping but woke easily to voice, no acute distress HEENT: atraumatic, clear conjunctiva, anicteric sclera, moist  mucus membranes, hearing grossly normal  Respiratory system: CTAB with diminished bases, no wheezes, rales or rhonchi, normal respiratory effort, on room air. Cardiovascular system: normal S1/S2, RRR, trace lower extremity edema.   Gastrointestinal system: soft, NT, ND, no HSM felt,  +bowel sounds. Central nervous system: no gross focal neurologic deficits, normal speech Extremities: moves all, no cyanosis, normal tone Skin: dry, intact, normal temperature Psychiatry: normal mood, congruent affect, abnormal judgement and insight due to baseline dementia   Data Reviewed:  Labs reviewed and notable for LDL 125, HDL 35.  Lactic acidosis normal x2.  Leukocytosis 21.5.  Hemoglobin 10.7 up from 9.6.  Blood cultures no growth to date  Family Communication: Son Telford Nab at bedside on rounds  Disposition: Status is: Inpatient Remains inpatient appropriate because: Severity of illness on IV antibiotics        Planned Discharge Destination: Skilled nursing facility     Time spent: 35 minutes  Author: Ezekiel Slocumb, DO 05/15/2021 3:00 PM  For on call review [email or web address].

## 2021-05-15 NOTE — Assessment & Plan Note (Addendum)
Diarrhea was present on admission, non-bloody.   C. difficile negative. GI panel showed E.coli Enteric precautions.   Monitor electrolytes and hydration status.  2/14: appears diarrhea improving

## 2021-05-15 NOTE — Assessment & Plan Note (Signed)
Hemoglobin stable Monitor CBC. 

## 2021-05-15 NOTE — TOC Progression Note (Addendum)
Transition of Care [facility]) - Progression Note    Patient Details  Name: Michele Lambert MRN: [id number] Date of Birth: [birth year]  Transition of Care [facility]) CM/SW Contact  Izola Price, RN Phone Number: 05/15/2021, 1:33 PM  Clinical Narrative: 2/11: Damaris Schooner to daughter, Raquel Sarna, at son's request regarding PT recommendations for STR/SNF and difference between ALF level of care, ability to provide care needed. Permission granted to initiate bed search and to communicate with agencies involved with discharge planning. [place] is fist choice and [place] second. Broad bed search also explained for back up choices. Also, explained that discharge plan can change if patient improves as medical care progresses. Speech/swallow evaluation completed. TOC to follow up and monitor after bed search initiated. Simmie Davies RN CM     Expected Discharge Plan: Skilled Nursing Facility Barriers to Discharge: Continued Medical Work up  Expected Discharge Plan and Services Expected Discharge Plan: [place]   Discharge Planning Services: CM Consult Post Acute Care Choice: [place] Living arrangements for the past 2 months: [place]                                       Social Determinants of Health (SDOH) Interventions    Readmission Risk Interventions No flowsheet data found.

## 2021-05-15 NOTE — Assessment & Plan Note (Addendum)
Patient resides at ALF memory care unit. Delirium and fall precautions. Palliative care consulted for [place] discussions.  Code status is DNR.

## 2021-05-15 NOTE — Evaluation (Signed)
Clinical/Bedside Swallow Evaluation Patient Details  Name: Michele Lambert MRN: [id number] Date of Birth: 05-15-1926  Today's Date: 05/15/2021 Time: SLP Start Time (ACUTE ONLY): 0840 SLP Stop Time (ACUTE ONLY): 0950 SLP Time Calculation (min) (ACUTE ONLY): 70 min  Past Medical History:  Past Medical History:  Diagnosis Date   Cancer ([place])    skin cancer right leg   CVA (cerebral vascular accident) ([place]) 05/06/2021   left side weakness   Dementia ([place])    Hypertension    Hypothyroidism    Past Surgical History:  Past Surgical History:  Procedure Laterality Date   INTRAMEDULLARY (IM) NAIL INTERTROCHANTERIC Left 05/07/2021   Procedure: INTRAMEDULLARY (IM) NAIL INTERTROCHANTRIC;  Surgeon: Thornton Park, MD;  Location: [facility] OR[facility];  Service: Orthopedics;  Laterality: Left;   ORIF HUMERUS FRACTURE Right 05/02/2018   Procedure: OPEN REDUCTION INTERNAL FIXATION (ORIF) DISTAL HUMERUS FRACTURE;  Surgeon: Hiram Gash, MD;  Location: [place];  Service: Orthopedics;  Laterality: Right;   HPI:  Per admitting H & P "Michele Lambert is a 87 y.o. female with medical history significant of hypertension, hypothyroidism, dementia, CKD stage IIIa, anemia, recent surgery for left hip fracture, recent intracranial parenchymal hemorrhage, who presents SOB.     Patient has dementia, and cannot provide likely with medical history.  Per his son at the bedside, at normal baseline, patient recognizes her son sometimes, but most of the time pt is not orientated to place and time. Today, pt's mental status is close to baseline per her son. Patient was recently hospitalized from 2/3 - 2/7 due to left hip fracture and intracranial parenchymal hemorrhage after fall. She under went left hip surgery and is currently doing rehab in SNF. Pt was found to have SOB and cough with little mucus production in facility.  Patient has gurgling sound, and seem to have difficulty swallowing.  Does not seem to have chest pain.  No fever or chills.   No active nausea, vomiting, diarrhea noted.  Patient moves all extremities." On previous admission Pt was placxed on Dys 1 diet with nectar thick liqids by TSP.    Assessment / Plan / Recommendation  Clinical Impression  Pt presents with overall moderate dysphagia and risk for aspiration. Pt tolerate boluses of applesauce/puree with no s/s of aspiration. Oral transit delay with occasional need to cues for a swallow. Min to no oral residue after the swallow. Pt tolerated most boluses of nectar thick liquid with the exception of an occasional throat clear or wet vocal quality. She was able to clear voice with a cues throat clear and swallow. No immediate s/s of aspiration or coughing noted. Pt did fatigue easily and needed extended time to propell boluses for a swallow. Son educated and assisted with feeding with ST present but reported he was not comfortable being the meal feeder at this time. Pt had an apparent aspiration event at the facility. Rec dysphagia 1 diet with honey thick liuqids by TSP at this time. Will provide treatment with trials of nectar thick to hoopefully upgrade soon. Continue to educate family. Nsg will need to feed Pt with strict aspiration precautions as posted above bed. Oral care after meals. Meds crushed in applesauce. Prognosis fair SLP Visit Diagnosis: Dysphagia, oropharyngeal phase (R13.12)    Aspiration Risk  Moderate aspiration risk    Diet Recommendation Dysphagia 1 (Puree);Honey-thick liquid   Liquid Administration via: Spoon Medication Administration: Crushed with puree Supervision: Full supervision/cueing for compensatory strategies;Staff to assist with self feeding Compensations: Minimize environmental  distractions;Slow rate;Small sips/bites;Lingual sweep for clearance of pocketing;Monitor for anterior loss;Multiple dry swallows after each bite/sip;Follow solids with liquid;Clear throat intermittently Postural Changes: Seated upright at 90 degrees;Remain upright  for at least 30 minutes after po intake    Other  Recommendations Oral Care Recommendations: Oral care BID;Oral care before and after PO;Staff/trained caregiver to provide oral care    Recommendations for follow up therapy are one component of a multi-disciplinary discharge planning process, led by the attending physician.  Recommendations may be updated based on patient status, additional functional criteria and insurance authorization.  Follow up Recommendations Skilled nursing-short term rehab (<3 hours/day)      Assistance Recommended at Discharge Frequent or constant Supervision/Assistance  Functional Status Assessment Patient has had a recent decline in their functional status and/or demonstrates limited ability to make significant improvements in function in a reasonable and predictable amount of time  Frequency and Duration min 3x week  1 week       Prognosis Prognosis for Safe Diet Advancement: Fair Barriers to Reach Goals: Cognitive deficits;Time post onset;Severity of deficits;Motivation      Swallow Study   General Date of Onset: 05/14/21 HPI: Per admitting H & P "Michele Lambert is a 88 y.o. female with medical history significant of hypertension, hypothyroidism, dementia, CKD stage IIIa, anemia, recent surgery for left hip fracture, recent intracranial parenchymal hemorrhage, who presents SOB.     Patient has dementia, and cannot provide likely with medical history.  Per his son at the bedside, at normal baseline, patient recognizes her son sometimes, but most of the time pt is not orientated to place and time. Today, pt's mental status is close to baseline per her son. Patient was recently hospitalized from 2/3 - 2/7 due to left hip fracture and intracranial parenchymal hemorrhage after fall. She under went left hip surgery and is currently doing rehab in SNF. Pt was found to have SOB and cough with little mucus production in facility.  Patient has gurgling sound, and seem to have  difficulty swallowing.  Does not seem to have chest pain.  No fever or chills.  No active nausea, vomiting, diarrhea noted.  Patient moves all extremities." On previous admission Pt was placxed on Dys 1 diet with nectar thick liqids by TSP. Type of Study: Bedside Swallow Evaluation Previous Swallow Assessment: Yes, Placed on Dys 2 with nectra htic with strict precautions on last admission Diet Prior to this Study: NPO Respiratory Status: Room air History of Recent Intubation: No Behavior/Cognition: Alert;Cooperative;Pleasant mood;Confused;Distractible;Requires cueing Oral Cavity Assessment: Within Functional Limits Oral Care Completed by SLP: Yes Oral Cavity - Dentition: Adequate natural dentition Vision: Functional for self-feeding Self-Feeding Abilities: Total assist Patient Positioning: Upright in bed Baseline Vocal Quality: Normal Volitional Cough: Cognitively unable to elicit Volitional Swallow: Unable to elicit    Oral/Motor/Sensory Function Overall Oral Motor/Sensory Function: Within functional limits   Ice Chips Ice chips: Within functional limits Presentation: Spoon   Thin Liquid Thin Liquid: Not tested    Nectar Thick Nectar Thick Liquid: Impaired Presentation: Spoon Oral phase functional implications: Prolonged oral transit Pharyngeal Phase Impairments: Throat Clearing - Delayed;Wet Vocal Quality (Occasional)   Honey Thick     Puree Puree: Impaired Presentation: Spoon Oral Phase Functional Implications: Prolonged oral transit   Solid     Solid: Not tested      Lucila Maine 05/15/2021,10:01 AM

## 2021-05-15 NOTE — TOC Initial Note (Addendum)
Transition of Care [facility]) - Initial/Assessment Note    Patient Details  Name: Michele Lambert MRN: [id number] Date of Birth: 12-Feb-1927  Transition of Care [facility]) CM/SW Contact:    Izola Price, RN Phone Number: 05/15/2021, 1:36 PM  Clinical Narrative:  2/11: Patient admitted 2/10 after previous discharge on 2/7 from [facility] to [place]. Significant dementia with recent hip surgery, dementia, and stroke/bleed.  Admitted via ED on 05/14/21 with SOB and DX of aspiration pneumonia/sepsis. PCP and Rx confirmed with daughter as patient orientation is limited and labile. Family wishes patient to return to ALF, but verbalizes understanding of need for higher level of care on discharge from acute care. TOC to monitor and follow. Simmie Davies RN CM              Expected Discharge Plan: Skilled Nursing Facility Barriers to Discharge: Continued Medical Work up   Patient Goals and CMS Choice     Choice offered to / list presented to : Adult Children  Expected Discharge Plan and Services Expected Discharge Plan: [place]   Discharge Planning Services: CM Consult Post Acute Care Choice: [place] Living arrangements for the past 2 months: [place]                                      Prior Living Arrangements/Services Living arrangements for the past 2 months: [place] Lives with:: Self, Facility Resident          Need for Family Participation in Patient Care: Yes (Comment) Care giver support system in place?: Yes (comment)   Criminal Activity/Legal Involvement Pertinent to Current Situation/Hospitalization: No - Comment as needed  Activities of Daily Living Home Assistive Devices/Equipment: Gilford Rile (specify type) (front wheel) ADL Screening (condition at time of admission) Patient's cognitive ability adequate to safely complete daily activities?: No Is the patient deaf or have difficulty hearing?: No Does the  patient have difficulty seeing, even when wearing glasses/contacts?: No Does the patient have difficulty concentrating, remembering, or making decisions?: Yes Patient able to express need for assistance with ADLs?: No Does the patient have difficulty dressing or bathing?: Yes Independently performs ADLs?: No Communication: Needs assistance (patient with dementia) Is this a change from baseline?: Pre-admission baseline Dressing (OT): Needs assistance Is this a change from baseline?: Change from baseline, expected to last >3 days Grooming: Needs assistance Is this a change from baseline?: Change from baseline, expected to last >3 days Feeding: Needs assistance Is this a change from baseline?: Change from baseline, expected to last <3 days Bathing: Needs assistance Is this a change from baseline?: Change from baseline, expected to last >3 days Toileting: Needs assistance Is this a change from baseline?: Change from baseline, expected to last >3days In/Out Bed: Needs assistance Is this a change from baseline?: Change from baseline, expected to last >3 days Walks in Home: Needs assistance Is this a change from baseline?: Change from baseline, expected to last >3 days Does the patient have difficulty walking or climbing stairs?: Yes Weakness of Legs: Both Weakness of Arms/Hands: Both  Permission Sought/Granted Permission sought to share information with : Case Manager, Customer service manager Permission granted to share information with : Yes, Verbal Permission Granted  Share Information with NAME: Pablo Ledger  Permission granted to share info w AGENCY: facilities related to bed placement, discharge disposition plan.  Permission granted to share info w  Relationship: Daugther  Permission granted to share info w Contact Information: PALETHORPE,EMILY Daughter   [phone]  Emotional Assessment         Alcohol / Substance Use: Not Applicable Psych Involvement: No  (comment)  Admission diagnosis:  Aspiration pneumonia ([place]) [J69.0] Aspiration pneumonia, unspecified aspiration pneumonia type, unspecified laterality, unspecified part of lung ([place]) [J69.0] Patient Active Problem List   Diagnosis Date Noted   Aspiration pneumonia ([place]) 05/14/2021   History of fracture of left hip 05/14/2021   Sepsis ([place]) 05/14/2021   Elevated troponin 05/14/2021   Chronic kidney disease, stage 3a ([place]) 05/14/2021   Normocytic anemia 05/14/2021   Diarrhea 05/14/2021   Traumatic intraparenchymal hemorrhage 05/07/2021   Closed intertrochanteric fracture of left hip, initial encounter ([place]) 05/07/2021   Age-related osteoporosis without current pathological fracture 05/07/2021   Essential hypertension 05/07/2021   CKD (chronic kidney disease) stage 3, GFR 30-59 ml/min ([place]) 10/01/2019   Fracture of distal humerus 05/03/2018   Displaced fracture of right humerus 05/01/2018   Hypothyroidism 05/01/2018   Dementia ([place]) 05/01/2018   PCP:  Dion Body, MD Pharmacy:   [facility] [place], [place] [address] [place] [address] Phone: [phone] Fax: [phone]     Social Determinants of Health (SDOH) Interventions    Readmission Risk Interventions No flowsheet data found.

## 2021-05-15 NOTE — Hospital Course (Signed)
Michele Lambert is a 88 y.o. female with medical history significant of hypertension, hypothyroidism, dementia, CKD stage IIIa, anemia, recent surgery for left hip fracture, recent intracranial parenchymal hemorrhage, who presented on 05/14/2021 with shortness of breath and wet sounding cough after an aspiration event at SNF.    Of note, pt was just discharged to rehab on 2/7 after admission for hip fracture after a fall at her ALF/memory care.  She also had an intracranial parenchymal hemorrhage sustained in that fall but stable neurologic status with no need for intervention.    Admitted and being treated with Unasyn for aspiration pneumonia.

## 2021-05-15 NOTE — NC FL2 (Signed)
[place] LEVEL OF CARE SCREENING TOOL     IDENTIFICATION  Patient Name: Michele Lambert Birthdate: Feb 18, 1927 Sex: female Admission Date (Current Location): 05/14/2021  [facility] and [place] Number:  Engineering geologist and Address:  [facility], [address] [place], [place] [address]      Provider Number: [id number]  Attending Physician Name and Address:  Ezekiel Slocumb, DO  Relative Name and Phone Number:  Raquel Sarna (daughter) [phone]    Current Level of Care: Hospital Recommended Level of Care: [place] Prior Approval Number:    Date Approved/Denied:   PASRR Number: [id number]  Discharge Plan: SNF    Current Diagnoses: Patient Active Problem List   Diagnosis Date Noted   Aspiration pneumonia ([place]) 05/14/2021   History of fracture of left hip 05/14/2021   Sepsis ([place]) 05/14/2021   Elevated troponin 05/14/2021   Chronic kidney disease, stage 3a ([place]) 05/14/2021   Normocytic anemia 05/14/2021   Diarrhea 05/14/2021   Traumatic intraparenchymal hemorrhage 05/07/2021   Closed intertrochanteric fracture of left hip, initial encounter ([place]) 05/07/2021   Age-related osteoporosis without current pathological fracture 05/07/2021   Essential hypertension 05/07/2021   CKD (chronic kidney disease) stage 3, GFR 30-59 ml/min ([place]) 10/01/2019   Fracture of distal humerus 05/03/2018   Displaced fracture of right humerus 05/01/2018   Hypothyroidism 05/01/2018   Dementia ([place]) 05/01/2018    Orientation RESPIRATION BLADDER Height & Weight     Self  Normal Incontinent, External catheter Weight: 146 lb 2.6 oz (66.3 kg) Height:  5\' 6"  (167.6 cm)  BEHAVIORAL SYMPTOMS/MOOD NEUROLOGICAL BOWEL NUTRITION STATUS      Incontinent Diet (dysphagia diet)  AMBULATORY STATUS COMMUNICATION OF NEEDS Skin   Extensive Assist Verbally Other (Comment) (left hip closed incision)                       Personal Care Assistance  Level of Assistance  Bathing, Feeding, Dressing, Total care Bathing Assistance: Maximum assistance Feeding assistance: Maximum assistance Dressing Assistance: Maximum assistance Total Care Assistance: Maximum assistance   Functional Limitations Info  Sight, Hearing, Speech Sight Info: Impaired Hearing Info: Impaired Speech Info: Adequate    SPECIAL CARE FACTORS FREQUENCY  PT (By licensed PT), OT (By licensed OT)     PT Frequency: min 5x weekly OT Frequency: min 5x weekly            Contractures Contractures Info: Not present    Additional Factors Info  Code Status, Allergies Code Status Info: DNR Allergies Info: codeine, lescol (fluvastatin sodium)           Current Medications (05/15/2021):  This is the current hospital active medication list Current Facility-Administered Medications  Medication Dose Route Frequency Provider Last Rate Last Admin   acetaminophen (TYLENOL) suppository 650 mg  650 mg Rectal Q6H PRN Ivor Costa, MD       acetaminophen (TYLENOL) tablet 650 mg  650 mg Oral Q6H PRN Ivor Costa, MD       Ampicillin-Sulbactam (UNASYN) 3 g in sodium chloride 0.9 % 100 mL IVPB  3 g Intravenous Q6H Nazari, Walid A, RPH 200 mL/hr at 05/15/21 1016 3 g at 05/15/21 1016   calcium-vitamin D (OSCAL WITH D) 500-5 MG-MCG per tablet 1 tablet  1 tablet Oral BID Ivor Costa, MD   1 tablet at 05/15/21 1015   Chlorhexidine Gluconate Cloth 2 % PADS 6 each  6 each Topical Daily Sharion Settler, NP   6 each  at 05/15/21 1017   hydrALAZINE (APRESOLINE) injection 5 mg  5 mg Intravenous Q2H PRN Ivor Costa, MD       lactated ringers infusion   Intravenous Continuous Ivor Costa, MD 75 mL/hr at 05/15/21 1309 New Bag at 05/15/21 1309   levothyroxine (SYNTHROID) tablet 125 mcg  125 mcg Oral q morning Ivor Costa, MD       ondansetron [facility]) injection 4 mg  4 mg Intravenous Q8H PRN Ivor Costa, MD       polyethylene glycol (MIRALAX / GLYCOLAX) packet 17 g  17 g Oral Daily PRN Ivor Costa, MD        traMADol [facility]) tablet 50 mg  50 mg Oral Q6H PRN Ivor Costa, MD       traZODone (DESYREL) tablet 50 mg  50 mg Oral QHS PRN Ivor Costa, MD         Discharge Medications: Please see discharge summary for a list of discharge medications.  Relevant Imaging Results:  Relevant Lab Results:   Additional Information [id number]  Alberteen Sam, LCSW

## 2021-05-15 NOTE — Evaluation (Signed)
Physical Therapy Evaluation Patient Details Name: Michele Lambert MRN: [id number] DOB: 1926/09/23 Today's Date: 05/15/2021  History of Present Illness  Pt is a 88 y/o F admitted on 05/14/21 with c/c of SOB. Pt is being treated for sepsis 2/2 aspiration PNA. Pt with recent hospitalization following a fall resulting in L intertrochanteric vx & L IM nail fixation on 05/07/21 by Dr. 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 PMH: HTN, hypothryoidism, dementia, CKD stage 3A  Clinical Impression  Pt seen for PT evaluation with co-tx with OT for pt & therapists safety. Pt is known to this PT from last admission. On this date, pt responds to her name but is not able to state her full name when asked. Pt found to be incontinent of liquid BM (son unable to report if incontinence is baseline) & pt completely unaware. Pt requires +2 assist to roll L<>R & dependent assist for peri hygiene. PT performs PROM to L hip with pt grimacing at end range. PT spoke to pt's son Gwyndolyn Saxon) via telephone yesterday & other son in room today; PT explained carryover & new learning will be extremely minimal to none due to pt's significant dementia. Both sons are hopeful pt will regain some function back. Will see pt on 2x/week frequency and upgrade if pt demonstrates improvement & ability to tolerate.       Recommendations for follow up therapy are one component of a multi-disciplinary discharge planning process, led by the attending physician.  Recommendations may be updated based on patient status, additional functional criteria and insurance authorization.  Follow Up Recommendations Skilled nursing-short term rehab (<3 hours/day)    Assistance Recommended at Discharge Frequent or constant Supervision/Assistance  Patient can return home with the following  Two people to help with walking and/or transfers;Two  people to help with bathing/dressing/bathroom;Direct supervision/assist for medications management;Help with stairs or ramp for entrance;Assistance with feeding;Assist for transportation;Direct supervision/assist for financial management;Assistance with cooking/housework    Equipment Recommendations None recommended by PT  Recommendations for Other Services       Functional Status Assessment Patient has had a recent decline in their functional status and demonstrates the ability to make significant improvements in function in a reasonable and predictable amount of time.     Precautions / Restrictions Precautions Precautions: Fall Restrictions Weight Bearing Restrictions: Yes LLE Weight Bearing: Weight bearing as tolerated      Mobility  Bed Mobility Overal bed mobility: Needs Assistance Bed Mobility: Rolling Rolling: Total assist, +2 for physical assistance (therapists attempt to cue/place hands on bed rail to allow pt to assist with positioning but pt begins pushing; pt with behaviors demonstrating pain when lying on L side)              Transfers                        Ambulation/Gait                  Stairs            Wheelchair Mobility    Modified Rankin (Stroke Patients Only)       Balance  Pertinent Vitals/Pain Pain Assessment Pain Assessment: Faces Faces Pain Scale: Hurts little more Pain Location: R shoulder with touching/movement, L hip with PT performing PROM Pain Descriptors / Indicators: Grimacing, Discomfort Pain Intervention(s): Monitored during session, Limited activity within patient's tolerance, Repositioned    Home Living Family/patient expects to be discharged to:: Skilled nursing facility                   Additional Comments: Prior to initial admission pt was at [facility] (ambulatory with RW) but following hip fx pt went to SNF rehab.     Prior Function               Mobility Comments: Pt was +2 assist bed level during last admission for hip fx.       Hand Dominance   Dominant Hand: Right    Extremity/Trunk Assessment   Upper Extremity Assessment Upper Extremity Assessment: Generalized weakness;Difficult to assess due to impaired cognition LUE Deficits / Details: Pt c/o R shoulder pain with touch/movement.    Lower Extremity Assessment Lower Extremity Assessment: Generalized weakness;Difficult to assess due to impaired cognition (PT performs LLE hip PROM & pt WNL but does grimace at end range & endorses pain when rolled to L side, resistant to hip abduction when PT performs it passively)       Communication   Communication: No difficulties  Cognition Arousal/Alertness: Lethargic (pt keeps eyes closed throughout majority of session even with staff performs peri hygiene) Behavior During Therapy: [facility] for tasks assessed/performed, Flat affect Overall Cognitive Status: History of cognitive impairments - at baseline                                 General Comments: Pt with hx of dementia & only oriented to self at baseline. During session pt responds to her name but unable to state full name when asked.        General Comments      Exercises     Assessment/Plan    PT Assessment Patient needs continued PT services  PT Problem List Decreased balance;Decreased knowledge of use of DME;Pain;Decreased activity tolerance;Decreased cognition;Cardiopulmonary status limiting activity;Decreased range of motion;Decreased coordination;Decreased knowledge of precautions;Decreased strength;Decreased mobility;Decreased safety awareness       PT Treatment Interventions DME instruction;Therapeutic exercise;Gait training;Balance training;Wheelchair mobility training;Manual techniques;Neuromuscular re-education;Stair training;Functional mobility training;Cognitive remediation;Modalities;Therapeutic  activities;Patient/family education    PT Goals (Current goals can be found in the Care Plan section)  Acute Rehab PT Goals Patient Stated Goal: to regain as much function as possible, "do what you can for her" PT Goal Formulation: With family Time For Goal Achievement: 05/29/21 Potential to Achieve Goals: Poor    Frequency Min 2X/week     Co-evaluation PT/OT/SLP Co-Evaluation/Treatment: Yes Reason for Co-Treatment: Complexity of the patient's impairments (multi-system involvement);Necessary to address cognition/behavior during functional activity;For patient/therapist safety PT goals addressed during session: Mobility/safety with mobility         AM-PAC PT "6 Clicks" Mobility  Outcome Measure Help needed turning from your back to your side while in a flat bed without using bedrails?: Total Help needed moving from lying on your back to sitting on the side of a flat bed without using bedrails?: Total Help needed moving to and from a bed to a chair (including a wheelchair)?: Total Help needed standing up from a chair using your arms (e.g., wheelchair or bedside chair)?: Total Help needed to walk in  hospital room?: Total Help needed climbing 3-5 steps with a railing? : Total 6 Click Score: 6    End of Session   Activity Tolerance: Patient tolerated treatment well Patient left: in bed;with call bell/phone within reach;with bed alarm set;with family/visitor present Nurse Communication: Mobility status PT Visit Diagnosis: Muscle weakness (generalized) (M62.81);Difficulty in walking, not elsewhere classified (R26.2);Pain Pain - part of body:  (L hip, R shoulder)    Time: 9924-2683 PT Time Calculation (min) (ACUTE ONLY): 23 min   Charges:   PT Evaluation $PT Eval Moderate Complexity: [place], PT, DPT 05/15/21, 11:23 AM   Waunita Schooner 05/15/2021, 11:21 AM

## 2021-05-16 DIAGNOSIS — J690 Pneumonitis due to inhalation of food and vomit: Secondary | ICD-10-CM | POA: Diagnosis not present

## 2021-05-16 DIAGNOSIS — N1831 Chronic kidney disease, stage 3a: Secondary | ICD-10-CM | POA: Diagnosis not present

## 2021-05-16 DIAGNOSIS — I10 Essential (primary) hypertension: Secondary | ICD-10-CM | POA: Diagnosis not present

## 2021-05-16 LAB — BASIC METABOLIC PANEL
Anion gap: 9 (ref 5–15)
BUN: 25 mg/dL — ABNORMAL HIGH (ref 8–23)
CO2: 25 mmol/L (ref 22–32)
Calcium: 8 mg/dL — ABNORMAL LOW (ref 8.9–10.3)
Chloride: 105 mmol/L (ref 98–111)
Creatinine, Ser: 0.73 mg/dL (ref 0.44–1.00)
GFR, Estimated: >60 mL/min (ref >60)
Glucose, Bld: 138 mg/dL — ABNORMAL HIGH (ref 70–99)
Potassium: 3.1 mmol/L — ABNORMAL LOW (ref 3.5–5.1)
Sodium: 139 mmol/L (ref 135–145)

## 2021-05-16 LAB — URINALYSIS, ROUTINE W REFLEX MICROSCOPIC
Bacteria, UA: NONE SEEN
Bilirubin Urine: NEGATIVE
Glucose, UA: NEGATIVE mg/dL
Hgb urine dipstick: NEGATIVE
Ketones, ur: 5 mg/dL — AB
Leukocytes,Ua: NEGATIVE
Nitrite: NEGATIVE
Protein, ur: 100 mg/dL — AB
Specific Gravity, Urine: 1.027 (ref 1.005–1.030)
Squamous Epithelial / HPF: NONE SEEN (ref 0–5)
pH: 6 (ref 5.0–8.0)

## 2021-05-16 LAB — CBC
HCT: 28.8 % — ABNORMAL LOW (ref 36.0–46.0)
Hemoglobin: 9.6 g/dL — ABNORMAL LOW (ref 12.0–15.0)
MCH: 29.5 pg (ref 26.0–34.0)
MCHC: 33.3 g/dL (ref 30.0–36.0)
MCV: 88.6 fL (ref 80.0–100.0)
Platelets: 335 10*3/uL (ref 150–400)
RBC: 3.25 MIL/uL — ABNORMAL LOW (ref 3.87–5.11)
RDW: 14.2 % (ref 11.5–15.5)
WBC: 20 10*3/uL — ABNORMAL HIGH (ref 4.0–10.5)
nRBC: 0 % (ref 0.0–0.2)

## 2021-05-16 LAB — GASTROINTESTINAL PANEL BY PCR, STOOL (REPLACES STOOL CULTURE)

## 2021-05-16 LAB — C DIFFICILE QUICK SCREEN W PCR REFLEX
C Diff antigen: NEGATIVE
C Diff interpretation: NOT DETECTED
C Diff toxin: NEGATIVE

## 2021-05-16 MED ORDER — POTASSIUM CHLORIDE 10 MEQ/100ML IV SOLN
10.0000 meq | Freq: Once | INTRAVENOUS | Status: AC
  Administered 2021-05-16: 10 meq via INTRAVENOUS
  Filled 2021-05-16: qty 100

## 2021-05-16 MED ORDER — POTASSIUM CHLORIDE 10 MEQ/100ML IV SOLN
10.0000 meq | INTRAVENOUS | Status: AC
  Administered 2021-05-16 (×2): 10 meq via INTRAVENOUS
  Filled 2021-05-16 (×2): qty 100

## 2021-05-16 MED ORDER — LOSARTAN POTASSIUM 50 MG PO TABS
50.0000 mg | ORAL_TABLET | Freq: Every day | ORAL | Status: DC
  Administered 2021-05-16 – 2021-05-20 (×5): 50 mg via ORAL
  Filled 2021-05-16 (×5): qty 1

## 2021-05-16 MED ORDER — POTASSIUM CHLORIDE 2 MEQ/ML IV SOLN
INTRAVENOUS | Status: DC
  Filled 2021-05-16 (×4): qty 1000

## 2021-05-16 NOTE — Progress Notes (Signed)
We lost her PIV since 4 PM and IV team tried but unfortunately, She can't get PIV so IV nurse will ask someone else to try. Marisa Severin, MD notified.

## 2021-05-16 NOTE — Progress Notes (Signed)
Progress Note   Patient: Michele Lambert DOB: [birth year] [name]: 05/14/2021     2 DOS: the patient was seen and examined on 05/16/2021   Brief hospital course: Michele Lambert is a 88 y.o. female with medical history significant of hypertension, hypothyroidism, dementia, CKD stage IIIa, anemia, recent surgery for left hip fracture, recent intracranial parenchymal hemorrhage, who presented on 05/14/2021 with shortness of breath and wet sounding cough after an aspiration event at SNF.    Of note, pt was just discharged to rehab on 2/7 after admission for hip fracture after a fall at her ALF/memory care.  She also had an intracranial parenchymal hemorrhage sustained in that fall but stable neurologic status with no need for intervention.    Admitted and being treated with Unasyn for aspiration pneumonia.  Assessment and Plan: * Aspiration pneumonia ([name])- (present on admission) Continue Unasyn. Seen by SLP for swallow evaluation and started on dysphagia 1 (pured) diet with nectar thick liquid by spoon, meds crushed in pure. Monitor respiratory and oxygen status, supplemental O2 if sats below 90% on room air. Supportive care.  Sepsis ([name])- (present on admission) Present on admission with leukocytosis, tachycardia and tachypnea in the setting of aspiration episode and pneumonitis/pneumonia. Continue IV antibiotics and follow cultures. Sepsis physiology has improved.  Diarrhea Present on admission. C. difficile and GI panel are pending.   Enteric precautions for now.   Low suspicion that this is infectious.   Monitor electrolytes and hydration status.  History of fracture of left hip Repaired prior admission, discharged to SNF on 2/7.  Continue PT and OT.  Anticipate return to SNF if patient is able to participate in therapy.  Fall precautions.  Pain control as needed but avoid narcotics as much as possible given dementia and risk for delirium.  Traumatic intraparenchymal  hemorrhage- (present on admission) Sustained in fall which also caused her hip fracture. No neurologic symptoms or complications. Monitor.  Avoid anticoagulants.  SCDs for VTE prophylaxis.  Elevated troponin- (present on admission) Likely due to demand ischemia in setting of respiratory distress following aspiration event.  Initial troponins were 19 and later 15.  Patient without apparent chest pain.  Chronic kidney disease, stage 3a ([name])- (present on admission) Renal function near baseline.  Monitor BMP  Essential hypertension- (present on admission) Home HCTZ and losartan were held on admission in the setting of sepsis and risk of hypotension.  Continue IV hydralazine as needed. BP is stable.  Normocytic anemia- (present on admission) Hemoglobin stable.  Monitor CBC  Hypothyroidism- (present on admission) Continue levothyroxine  Dementia ([name])- (present on admission) Patient resides at ALF memory care unit. Delirium and fall precautions.        Subjective: Pt seen being fed breakfast, son at bedside.  No acute events reported overnight.  Pt reports feeling okay, denies any breathing difficulty or cough.    Physical Exam: Vitals:   05/15/21 0352 05/15/21 1540 05/15/21 1945 05/16/21 0438  BP: 135/63 128/60 (!) 151/71 (!) 173/89  Pulse: 74 95 99 96  Resp: 18 20 20 20   Temp: 98.1 F (36.7 C) 98.2 F (36.8 C) (!) 97.5 F (36.4 C) 98.6 F (37 C)  TempSrc: Oral Oral Oral Oral  SpO2: 96% 98% 94% 91%  Weight:      Height:       General exam: awake, alert, no acute distress HEENT: moist mucus membranes, hearing grossly normal  Respiratory system: CTAB diminished bases, no wheezes, rales or rhonchi, normal respiratory effort. Cardiovascular  system: normal S1/S2, RRR, trace LE edema.   Gastrointestinal system: soft, NT, ND, no HSM felt, +bowel sounds. Central nervous system: alert, oriented to self and place, no gross focal neurologic deficits, normal speech Skin: dry,  intact, normal temperature Psychiatry: normal mood, congruent affect   Data Reviewed:  Labs reviewed and notable for potassium 3.1, glucose 138, BUN 25, WBC 20.0, hemoglobin 9.6.  Urinalysis negative for evidence of any infection  Family Communication: Son O'Kean at bedside.  Spoke with son Gwyndolyn Saxon by phone as well.  Disposition: Status is: Inpatient Remains inpatient appropriate because: Remains on IV antibiotics.  Will require SNF placement for rehab which is pending.          Planned Discharge Destination: Skilled nursing facility     Time spent: 35 minutes  Author: Ezekiel Slocumb, DO 05/16/2021 11:55 AM  For on call review [email or web address].

## 2021-05-16 NOTE — Progress Notes (Signed)
Speech Language Pathology Treatment:    Patient Details Name: Michele Lambert MRN: [id number] DOB: 1926/06/23 Today's Date: 05/16/2021 Time: 1010-1030 SLP Time Calculation (min) (ACUTE ONLY): 20 min  Assessment / Plan / Recommendation Clinical Impression  Pt seen for diet tolerance and trials of upgraded textures. Pt alert and pleasantly confused. Son at bedside. Son reports pt did "much better" with items from pureed diet / honey-thick liquid tray this AM.   Pt observed with trials of pureed, honey-thick liquids, and nectar-thick liquids. All liquids administered via tsp. Pt demonstrated inconsistently delayed oral transit across textures. No overt or subtle s/sx with pureed texture or honey-thick liquids; however, x1 delayed/wet cough appreciated with nectar-thick liquids.   Discussed diet options, risks of aspiration, safe swallowing strategies/aspiration precautions, and SLP POC with son. Son agreeable to continuing pureed diet with honey-thick liquids with safe swallowing strategies/aspiration precautions as outlined below including ALL LIQUIDS VIA TEASPOON with plan for continued SLP re-evaluation. Son verbalized understanding/agreement with education provided.   Primary nurse and NT entered room toward end of evaluation. Nursing staff updated re: diet recommendations, safe swallowing strategies/aspiration precautions, and SLP POC. Pt left in the care of nursing staff.   SLP to f/u per POC for continued clinical swallowing re-evaluation, as appropriate.    HPI HPI: Per admitting H & P "Michele Lambert is a 88 y.o. female with medical history significant of hypertension, hypothyroidism, dementia, CKD stage IIIa, anemia, recent surgery for left hip fracture, recent intracranial parenchymal hemorrhage, who presents SOB.     Patient has dementia, and cannot provide likely with medical history.  Per his son at the bedside, at normal baseline, patient recognizes her son sometimes, but most of the time pt  is not orientated to place and time. Today, pt's mental status is close to baseline per her son. Patient was recently hospitalized from 2/3 - 2/7 due to left hip fracture and intracranial parenchymal hemorrhage after fall. She under went left hip surgery and is currently doing rehab in SNF. Pt was found to have SOB and cough with little mucus production in facility.  Patient has gurgling sound, and seem to have difficulty swallowing.  Does not seem to have chest pain.  No fever or chills.  No active nausea, vomiting, diarrhea noted.  Patient moves all extremities." On previous admission Pt was placxed on Dys 1 diet with nectar thick liqids by TSP.      SLP Plan  Continue with current plan of care      Recommendations for follow up therapy are one component of a multi-disciplinary discharge planning process, led by the attending physician.  Recommendations may be updated based on patient status, additional functional criteria and insurance authorization.    Recommendations  Diet recommendations: Dysphagia 1 (puree);Honey-thick liquid Liquids provided via: Teaspoon Medication Administration: Crushed with puree Supervision: Full supervision/cueing for compensatory strategies;Staff to assist with self feeding Compensations: Minimize environmental distractions;Slow rate;Small sips/bites;Follow solids with liquid Postural Changes and/or Swallow Maneuvers: Out of bed for meals;Seated upright 90 degrees;Upright 30-60 min after meal                Oral Care Recommendations: Oral care BID;Oral care before and after PO;Staff/trained caregiver to provide oral care Follow Up Recommendations: Skilled nursing-short term rehab (<3 hours/day) Assistance recommended at discharge: Frequent or constant Supervision/Assistance SLP Visit Diagnosis: Dysphagia, oropharyngeal phase (R13.12) Plan: Continue with current plan of care          Cherrie Gauze, M.S., [place]  [facility] [phone] ([place])   Quintella Baton  05/16/2021, 11:22 AM

## 2021-05-17 ENCOUNTER — Encounter: Payer: Self-pay | Admitting: Internal Medicine

## 2021-05-17 DIAGNOSIS — Z515 Encounter for palliative care: Secondary | ICD-10-CM

## 2021-05-17 DIAGNOSIS — S06300D Unspecified focal traumatic brain injury without loss of consciousness, subsequent encounter: Secondary | ICD-10-CM

## 2021-05-17 DIAGNOSIS — A498 Other bacterial infections of unspecified site: Secondary | ICD-10-CM

## 2021-05-17 DIAGNOSIS — Z7189 Other specified counseling: Secondary | ICD-10-CM | POA: Diagnosis not present

## 2021-05-17 DIAGNOSIS — J690 Pneumonitis due to inhalation of food and vomit: Secondary | ICD-10-CM | POA: Diagnosis not present

## 2021-05-17 DIAGNOSIS — Z8781 Personal history of (healed) traumatic fracture: Secondary | ICD-10-CM | POA: Diagnosis not present

## 2021-05-17 LAB — BASIC METABOLIC PANEL
Anion gap: 9 (ref 5–15)
BUN: 22 mg/dL (ref 8–23)
CO2: 26 mmol/L (ref 22–32)
Calcium: 7.9 mg/dL — ABNORMAL LOW (ref 8.9–10.3)
Chloride: 106 mmol/L (ref 98–111)
Creatinine, Ser: 0.76 mg/dL (ref 0.44–1.00)
GFR, Estimated: >60 mL/min (ref >60)
Glucose, Bld: 111 mg/dL — ABNORMAL HIGH (ref 70–99)
Potassium: 3.6 mmol/L (ref 3.5–5.1)
Sodium: 141 mmol/L (ref 135–145)

## 2021-05-17 LAB — CBC
HCT: 27.3 % — ABNORMAL LOW (ref 36.0–46.0)
Hemoglobin: 8.8 g/dL — ABNORMAL LOW (ref 12.0–15.0)
MCH: 28 pg (ref 26.0–34.0)
MCHC: 32.2 g/dL (ref 30.0–36.0)
MCV: 86.9 fL (ref 80.0–100.0)
Platelets: 367 10*3/uL (ref 150–400)
RBC: 3.14 MIL/uL — ABNORMAL LOW (ref 3.87–5.11)
RDW: 14.4 % (ref 11.5–15.5)
WBC: 18.7 10*3/uL — ABNORMAL HIGH (ref 4.0–10.5)
nRBC: 0 % (ref 0.0–0.2)

## 2021-05-17 NOTE — TOC Progression Note (Signed)
Transition of Care [facility]) - Progression Note    Patient Details  Name: CATINA NUSS MRN: [id number] Date of Birth: 09/28/1926  Transition of Care [facility]) CM/SW [place], LCSW Phone Number: 05/17/2021, 2:23 PM  Clinical Narrative:  Hessie Knows is reviewing referral.   Expected Discharge Plan: [place] Barriers to Discharge: Continued Medical Work up  Expected Discharge Plan and Services Expected Discharge Plan: [place]   Discharge Planning Services: CM Consult Post Acute Care Choice: [place] Living arrangements for the past 2 months: [place]                                       Social Determinants of Health (SDOH) Interventions    Readmission Risk Interventions No flowsheet data found.

## 2021-05-17 NOTE — Progress Notes (Signed)
Speech Language Pathology Treatment: Dysphagia  Patient Details Name: Michele Lambert MRN: [id number] DOB: [birth year] Today's Date: 05/17/2021 Time: 2637-8588 SLP Time Calculation (min) (ACUTE ONLY): 45 min  Assessment / Plan / Recommendation Clinical Impression  Pt seen today for ongoing assessment of pt's swallowing, toleration of dysphagia diet, and trials to upgrade to Nectar liquids if possible(as during last admit). Son reported she seemed to tolerate po's w/ his Sister feeding her earlier w/out overt coughing noted. No obvious lethargy though she liked to keep her eyes closed though talking -- suspect impact from Baseline Dementia. Pt was resting but fully awakened w/ positioned Upright for po's. Oral care completed w/ debris removed from oral cavity.    At this assessment, pt exhibited min Left sided weakness: left oral/labial decreased tone w/ mild left lingual weakness noted as well during lingual sweeping and bolus management. Though, this appeared improved from last admit. Speech intelligibility was fair-good w/ words spoken -- min mumbled as well(which might be impact from Cognitive decline as well). Pt's speech was much improved per Son.     Pt presented w/ oropharyngeal phase dysphagia c/b MOD oral phase deficits including: decreased bolus manipulation and increased oral phase time w/ slow A-P transfer, oral holding. Any decline in Cognitive status, baseline Dementia, impacting her overall awareness/engagement w/ po tasks which increases risk for aspiration. Pt is at MOD risk for aspiration d/t impact of Baseline Dementia, recent acute CVA(basal ganglia), advanced age, medications, and deconditioned State. Risk can be reduced somewhat when following aspiration precautions, given feeding support, and when using a modified Dysphagia diet including Nectar consistency liquids via tsp. She requires MOD oral, tactile/verbal/visual cues and monitoring w/ all oral intake. Also noted a head turn to  her Left w/ reduced engagement to turn head to Right side despite MAX cues: mild discomfort noted upon attempting to softly/gently guide and support pt's head to midline for po's, then to Right. NSG and PT updated.    Pt consumed trials of Nectar liquids via TSP w/ No overt coughing; throat clearing and vocal quality noted, no decline in respiratory status during/post trials. HOWEVER, there is concern for pharyngeal phase deficits d/t the degree of Cognitive decline w/ decreased attention/awareness, as well as the oral phase deficits/dysphagia as described above. Oral phase was lengthy, and she required MOD visual/verbal/tactile cues to swallow and clear the boluses. She was encouraged also to engage in self-feeding, but overall, FULL feeding support given w/ foods. No trials of solids nor thin liquids were assessed d/t risk for aspiration, choking.    D/t presentation and risk for aspiration from oropharyngeal phase deficits, recommend a Dysphagia level 1(PUREE foods moistened well) w/ Nectar liquids VIA TSP/CUP -- no straws. Aspiration precautions including reduce Distractions during meals and engage pt in self-feeding w/ support offered, check for oral clearing during/post intake as needed, cues to swallow/clear and alternate foods/liquids. Pills Crushed in Puree for safer swallowing. NSG/MD updated.  Recommend ST services f/u at SNF/Rehab for ongoing assessment, education, and any trial upgrade of diet post the Acuity of this illness/admit secondary to Baseline Dementia/Cognitive decline. Recommend f/u w/ Palliative Care services for support and GOC.    Thorough Education given to Son on above. Discussed diet consistency of foods/thickened liquids(Nectar) for now and at Rehab; aspiration precautions; pills Crushed in puree; feeding support and Supervision; swallow strategies; dysphagia and risk for Pulmonary impact from aspiration. Precautions posted in room on the above discussed. Contacted PT re: the  head turn Left; to address  any mild torticollis and/or tx (which Son noted also).       HPI HPI: Per admitting H & P "Michele Lambert is a 88 y.o. female with medical history significant of hypertension, hypothyroidism, Dementia, CKD stage IIIa, anemia, recent surgery for left hip fracture, recent intracranial parenchymal hemorrhage, who presents SOB.  Patient has dementia, and cannot provide likely with medical history.  Per his son at the bedside, at normal baseline, patient recognizes her son sometimes, but most of the time pt is not orientated to place and time. Today, pt's mental status is close to baseline per her son.  Patient was recently hospitalized from 2/3 - 2/7 due to left hip fracture and intracranial parenchymal hemorrhage after fall. She under went left hip surgery and is currently doing rehab in SNF.  Pt was found to have SOB and cough with little mucus production in facility.  Patient has gurgling sound, and seem to have difficulty swallowing.  Does not seem to have chest pain.  No fever or chills.  No active nausea, vomiting, diarrhea noted.  Patient moves all extremities."  On previous admission, pt was placxed on Dys 1 diet with nectar thick liqids by TSP by ST services then transferred to SNF for Rehab.       SLP Plan  Continue with current plan of care      Recommendations for follow up therapy are one component of a multi-disciplinary discharge planning process, led by the attending physician.  Recommendations may be updated based on patient status, additional functional criteria and insurance authorization.    Recommendations  Diet recommendations: Dysphagia 1 (puree);Nectar-thick liquid Liquids provided via: Teaspoon (only) Medication Administration: Crushed with puree Supervision: Full supervision/cueing for compensatory strategies;Staff to assist with self feeding Compensations: Minimize environmental distractions;Slow rate;Small sips/bites;Lingual sweep for clearance of  pocketing;Multiple dry swallows after each bite/sip;Follow solids with liquid Postural Changes and/or Swallow Maneuvers: Seated upright 90 degrees;Upright 30-60 min after meal;Out of bed for meals                General recommendations:  (Dietician f/u; Palliative Care f/u) Oral Care Recommendations: Oral care BID;Oral care before and after PO;Staff/trained caregiver to provide oral care Follow Up Recommendations: Skilled nursing-short term rehab (<3 hours/day) Assistance recommended at discharge: Frequent or constant Supervision/Assistance SLP Visit Diagnosis: Dysphagia, oropharyngeal phase (R13.12) (recent stroke; Baseline Dementia) Plan: Continue with current plan of care             Orinda Kenner, [place], [place] Speech Language Pathologist Rehab Services; [place] [phone] ([place]) Tameika Heckmann  05/17/2021, 3:21 PM

## 2021-05-17 NOTE — Progress Notes (Signed)
Physical Therapy Treatment Patient Details Name: Michele Lambert MRN: [id number] DOB: 02-Apr-1927 Today's Date: 05/17/2021   History of Present Illness Pt is a 88 y/o F admitted on 05/14/21 with c/c of SOB. Pt is being treated for sepsis 2/2 aspiration PNA. Pt with recent hospitalization following a fall resulting in L intertrochanteric vx & L IM nail fixation on 05/07/21 by Dr. 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 PMH: HTN, hypothryoidism, dementia, CKD stage 3A    PT Comments    Pt lethargic but responded to minimal stimuli.  Pt found with neck rotated to the L with gentle AA/PROM into R cervical rotation and 15 sec holds provided with pt eventually able to achieve R rotation slightly past neutral.  Pt required extensive +2 assist with all functional tasks per below but did present with fair static sitting balance at EOB and was able to clear the surface of the bed during transfer training.  Pt will benefit from PT services in a SNF setting upon discharge to safely address deficits listed in patient problem list for decreased caregiver assistance and eventual return to PLOF.    Recommendations for follow up therapy are one component of a multi-disciplinary discharge planning process, led by the attending physician.  Recommendations may be updated based on patient status, additional functional criteria and insurance authorization.  Follow Up Recommendations  Skilled nursing-short term rehab (<3 hours/day)     Assistance Recommended at Discharge Frequent or constant Supervision/Assistance  Patient can return home with the following Two people to help with walking and/or transfers;Two people to help with bathing/dressing/bathroom;Direct supervision/assist for medications management;Help with stairs or ramp for entrance;Assistance with feeding;Assist for transportation;Direct  supervision/assist for financial management;Assistance with cooking/housework   Equipment Recommendations  None recommended by PT    Recommendations for Other Services       Precautions / Restrictions Precautions Precautions: Fall Restrictions Weight Bearing Restrictions: Yes LLE Weight Bearing: Weight bearing as tolerated Other Position/Activity Restrictions: Pt favoring cervical rotation to the L with concern of beginning stages of contracture     Mobility  Bed Mobility Overal bed mobility: Needs Assistance Bed Mobility: Rolling Rolling: Max assist, +2 for physical assistance   Supine to sit: +2 for physical assistance, HOB elevated, Max assist Sit to supine: HOB elevated, +2 for physical assistance, Max assist   General bed mobility comments: +2 Max A for BLE and trunk control    Transfers Overall transfer level: Needs assistance Equipment used: Rolling walker (2 wheels) Transfers: Sit to/from Stand Sit to Stand: +2 physical assistance, Max assist           General transfer comment: Pt able to clear surface of bed with +2 Max A but unable to come to full upright standing or to remain in standing without constant +2 assist    Ambulation/Gait               General Gait Details: Unable   Stairs             Wheelchair Mobility    Modified Rankin (Stroke Patients Only)       Balance Overall balance assessment: Needs assistance Sitting-balance support: Bilateral upper extremity supported, Feet supported Sitting balance-Leahy Scale: Fair Sitting balance - Comments: Pt required assistance to achieve neutral upright sitting position but then was able to hold position without assist   Standing balance support: Bilateral upper extremity supported Standing balance-Leahy  Scale: Zero                              Cognition Arousal/Alertness: Lethargic Behavior During Therapy: Flat affect Overall Cognitive Status: History of cognitive  impairments - at baseline                                          Exercises Other Exercises Other Exercises: Gentle right cervical rotation AAROM Other Exercises: Positioning education with pt and nursing to achieve near neutral cervical rotation in supine Other Exercises: Static sitting at EOB x 8 min for improved activity tolerance Other Exercises: Rolling L/R for trunk therex and mobility training    General Comments        Pertinent Vitals/Pain Pain Assessment Pain Assessment: 0-10 Breathing: normal Negative Vocalization: occasional moan/groan, low speech, negative/disapproving quality Facial Expression: sad, frightened, frown Body Language: tense, distressed pacing, fidgeting Consolability: distracted or reassured by voice/touch PAINAD Score: 4 Pain Location: w/ head turn to R and with functional mobility Pain Descriptors / Indicators: Grimacing, Discomfort Pain Intervention(s): Repositioned, Monitored during session    Home Living                          Prior Function            PT Goals (current goals can now be found in the care plan section) Progress towards PT goals: Progressing toward goals    Frequency    Min 2X/week      PT Plan Current plan remains appropriate    Co-evaluation              AM-PAC PT "6 Clicks" Mobility   Outcome Measure  Help needed turning from your back to your side while in a flat bed without using bedrails?: Total Help needed moving from lying on your back to sitting on the side of a flat bed without using bedrails?: Total Help needed moving to and from a bed to a chair (including a wheelchair)?: Total Help needed standing up from a chair using your arms (e.g., wheelchair or bedside chair)?: Total Help needed to walk in hospital room?: Total Help needed climbing 3-5 steps with a railing? : Total 6 Click Score: 6    End of Session Equipment Utilized During Treatment: Gait belt Activity  Tolerance: Patient tolerated treatment well Patient left: in bed;with call bell/phone within reach;with bed alarm set;with nursing/sitter in room Nurse Communication: Mobility status PT Visit Diagnosis: Muscle weakness (generalized) (M62.81);Difficulty in walking, not elsewhere classified (R26.2);Pain Pain - Right/Left: Left Pain - part of body: Hip     Time: 5956-3875 PT Time Calculation (min) (ACUTE ONLY): 38 min  Charges:  $Therapeutic Activity: 38-52 mins                    D. Scott Ashima Shrake PT, DPT 05/17/21, 4:12 PM

## 2021-05-17 NOTE — Consult Note (Signed)
Pharmacy Antibiotic Note  Michele Lambert is a 88 y.o. female admitted on 05/14/2021 with  aspiration pneumonia .  Pharmacy has been consulted for Unasyn dosing.  Plan: Continue Unasyn 3g IV Q6 hours. MD likely to de-escalate antibiotics in the next 24-48 hours.  Height: 5\' 6"  (167.6 cm) Weight: 66.3 kg (146 lb 2.6 oz) IBW/kg (Calculated) : 59.3  Temp (24hrs), Avg:98.5 F (36.9 C), Min:97.8 F (36.6 C), Max:99 F (37.2 C)  Recent Labs  Lab 05/11/21 0347 05/14/21 0951 05/14/21 1835 05/14/21 2044 05/15/21 0411 05/16/21 0325 05/17/21 0538  WBC 12.5* 20.7*  --   --  21.5* 20.0* 18.7*  CREATININE 0.85 0.97  --   --   --  0.73 0.76  LATICACIDVEN  --   --  1.3 1.8  --   --   --      Estimated Creatinine Clearance: 42 mL/min (by C-G formula based on SCr of 0.76 mg/dL).    Allergies  Allergen Reactions   Codeine    Lescol [Fluvastatin Sodium]     Antimicrobials this admission: Unasyn 2/10 >>   Microbiology results: 2/10 BCx: NGTD 2/10 Sputum: pending   Thank you for allowing pharmacy to be a part of this patients care.  Lance Coon A Elwyn Klosinski 05/17/2021 2:27 PM

## 2021-05-17 NOTE — Consult Note (Signed)
Consultation Note Date: 05/17/2021   Patient Name: Michele Lambert  DOB: 05/11/1926  MRN: [id number]  Age / Sex: 88 y.o., female  PCP: Dion Body, MD Referring Physician: Ezekiel Slocumb, DO  Reason for Consultation: Establishing goals of care  HPI/Patient Profile: 88 y.o. female  with past medical history of advancing dementia, HTN, hypothyroid, CKD 3, anemia, recent left ORIF 2/3 admitted on 05/14/2021 with aspiration pneumonia, sepsis present on admission.   Clinical Assessment and Goals of Care: I have reviewed medical records including EPIC notes, labs and imaging, received report from RN, assessed the patient.  Michele Lambert is lying quietly in bed.  She appears acutely/chronically ill and quite frail.  Although alert, she has known dementia and is only able to tell me her name.  She is calm and cooperative, not fearful.  Her son Telford Nab and daughter Michele Lambert are at bedside assisting with feeding.   Lenoard Aden, and I meet at bedside to discuss diagnosis prognosis, GOC, EOL wishes, disposition and options. I introduced Palliative Medicine as specialized medical care for people living with serious illness. It focuses on providing relief from the symptoms and stress of a serious illness. The goal is to improve quality of life for both the patient and the family.  We discussed a brief life review of the patient.  Michele Lambert has 3 children, Jori Moll, and Jackson.  She started exhibiting signs of memory loss about 6 or 7 years ago.  Her daughter Michele Lambert lives next-door and was her main caregiver for many years.  [name] transition to [facility]" about 3 months ago.  Family shares that she had a stroke, which caused her to fall and break her hip.     We then focused on their current illness.  We talk about her aspiration pneumonia, dietary restrictions, speech therapy consult.  We also talked about physical  therapy recommendations, family's desire for short-term rehab, awaiting bed offers.  We also talked at length about the chronic illness pathway for memory loss.  We will talk about physical, mental, nutritional changes that are expected.  The natural disease trajectory and expectations at EOL were discussed.  Advanced directives, concepts specific to code status, were verified.  I ask family if Michele Lambert chose DNR for herself or if it was chosen for her.  They shared that they are not sure.  Discussed the importance of continued conversation with family and the medical providers regarding overall plan of care and treatment options, ensuring decisions are within the context of the patients values and GOCs.  Questions and concerns were addressed.  The family was encouraged to call with questions or concerns.  PMT will continue to support holistically.  Conference with attending, bedside nursing staff, transition of care team related to patient condition, needs, goals of care, disposition.   HCPOA HCPOA -son, Corey Caulfield is healthcare power of attorney.  Son Marshia Ly, is DURABLE POWER OF ATTORNEY.  Daughter Michele Lambert, main caregiver for several years, lived next door prior to admitting to "  [facility] approximately 3 months ago.    SUMMARY OF RECOMMENDATIONS   At this point continue to treat the treatable but no CPR or intubation. Family is requesting short-term rehab  Time for outcomes Ultimate desire is to return to[facility] if able.   Code Status/Advance Care Planning: DNR  Symptom Management:  Per hospitalist, no additional needs at this time.  Palliative Prophylaxis:  Frequent Pain Assessment and Oral Care  Additional Recommendations (Limitations, Scope, Preferences): Continue to treat the treatable but no CPR or intubation  Psycho-social/Spiritual:  Desire for further Chaplaincy support:no Additional Recommendations: Caregiving  Support/Resources and Education on  Hospice  Prognosis:  Unable to determine, based on outcomes.  Guarded at this point.  6 months or less would not be surprising based on advanced age coupled with advancing dementia, recent trauma of left hip fracture with transitioning to ALF.  Discharge Planning:  To be determined.  Family is requesting short-term rehab if qualified.  Anticipate return to [facility] if possible.       Primary Diagnoses: Present on Admission:  Aspiration pneumonia ([place])  Hypothyroidism  Dementia ([place])  Essential hypertension  Sepsis ([place])  Elevated troponin  Traumatic intraparenchymal hemorrhage  Chronic kidney disease, stage 3a (HCC)  Normocytic anemia   I have reviewed the medical record, interviewed the patient and family, and examined the patient. The following aspects are pertinent.  Past Medical History:  Diagnosis Date   Cancer [facility])    skin cancer right leg   CVA (cerebral vascular accident) ([place]) 05/06/2021   left side weakness   Dementia ([place])    Hypertension    Hypothyroidism    Social History   Socioeconomic History   Marital status: Widowed    Spouse name: Not on file   Number of children: Not on file   Years of education: Not on file   Highest education level: Not on file  Occupational History   Not on file  Tobacco Use   Smoking status: Never   Smokeless tobacco: Never  Substance and Sexual Activity   Alcohol use: Not Currently   Drug use: Never   Sexual activity: Not Currently  Other Topics Concern   Not on file  Social History Narrative   Not on file   Social Determinants of Health   Financial Resource Strain: Not on file  Food Insecurity: Not on file  Transportation Needs: Not on file  Physical Activity: Not on file  Stress: Not on file  Social Connections: Not on file   Family History  Problem Relation Age of Onset   Stroke Mother    Scheduled Meds:  calcium-vitamin D  1 tablet Oral BID   Chlorhexidine Gluconate Cloth  6 each Topical Daily    levothyroxine  125 mcg Oral q morning   losartan  50 mg Oral Daily   Continuous Infusions:  ampicillin-sulbactam (UNASYN) IV 3 g (05/17/21 0844)   PRN Meds:.acetaminophen, acetaminophen, hydrALAZINE, ondansetron (ZOFRAN) IV, polyethylene glycol, traMADol, traZODone Medications Prior to Admission:  Prior to Admission medications   Medication Sig Start Date End Date Taking? Authorizing Provider  Calcium Carb-Cholecalciferol 600-10 MG-MCG TABS Take 1 tablet by mouth 2 (two) times daily.   Yes [provider]  hydrochlorothiazide (HYDRODIURIL) 25 MG tablet Take 25 mg by mouth daily. 04/20/21  Yes [provider]  levothyroxine (SYNTHROID) 125 MCG tablet Take 125 mcg by mouth every morning. 04/20/21  Yes [provider]  losartan (COZAAR) 50 MG tablet Take 50 mg by mouth every  evening. 05/02/21  Yes [provider]  acetaminophen (TYLENOL) 650 MG CR tablet Take 650 mg by mouth every 8 (eight) hours as needed. 12/24/14   [provider]  enoxaparin (LOVENOX) 40 MG/0.4ML injection Inject 0.4 mLs (40 mg total) into the skin daily. Patient not taking: Reported on 05/14/2021 05/11/21 06/11/21  Gwynne Edinger, MD  polyethylene glycol [facility] / Floria Raveling) packet Take 17 g by mouth daily. Patient taking differently: Take 17 g by mouth daily as needed. 05/06/18   Elodia Florence., MD  traMADol (ULTRAM) 50 MG tablet Take 1 tablet (50 mg total) by mouth every 6 (six) hours as needed for moderate pain. 05/11/21   Wouk, Ailene Rud, MD  traZODone (DESYREL) 50 MG tablet Take 1 tablet (50 mg total) by mouth at bedtime as needed for up to 30 days for sleep. 05/06/18 06/05/18  Elodia Florence., MD   Allergies  Allergen Reactions   Codeine    Lescol [Fluvastatin Sodium]    Review of Systems  Unable to perform ROS: Dementia   Physical Exam Vitals and nursing note reviewed.  Constitutional:      General: She is not in acute distress.    Appearance: She is  ill-appearing.  Cardiovascular:     Rate and Rhythm: Normal rate.  Pulmonary:     Effort: Pulmonary effort is normal. No tachypnea.  Skin:    General: Skin is warm and dry.  Neurological:     Comments: Known dementia  Psychiatric:     Comments: Calm and cooperative, not fearful    Vital Signs: BP (!) 142/84 (BP Location: Right Arm)    Pulse 86    Temp 99 F (37.2 C) (Axillary)    Resp 16    Ht 5\' 6"  (1.676 m)    Wt 66.3 kg    SpO2 98%    BMI 23.59 kg/m  Pain Scale: 0-10   Pain Score: 0-No pain   SpO2: SpO2: 98 % O2 Device:SpO2: 98 % O2 Flow Rate: .   IO: Intake/output summary:  Intake/Output Summary (Last 24 hours) at 05/17/2021 1259 Last data filed at 05/17/2021 7106 Gross per 24 hour  Intake 1237.92 ml  Output 800 ml  Net 437.92 ml    LBM: Last BM Date: 05/14/21 Baseline Weight: Weight: 70 kg Most recent weight: Weight: 66.3 kg     Palliative Assessment/Data:   Flowsheet Rows    Flowsheet Row Most Recent Value  Intake Tab   Referral Department Hospitalist  Unit at Time of Referral Med/Surg Unit  Palliative Care Primary Diagnosis Pulmonary  Date Notified 05/14/21  Palliative Care Type New Palliative care  Reason for referral Clarify Goals of Care  Date of Admission 05/14/21  Date first seen by Palliative Care 05/17/21  # of days Palliative referral response time 3 Day(s)  # of days IP prior to Palliative referral 0  Clinical Assessment   Palliative Performance Scale Score 20%  Pain Max last 24 hours Not able to report  Pain Min Last 24 hours Not able to report  Dyspnea Max Last 24 Hours Not able to report  Dyspnea Min Last 24 hours Not able to report  Psychosocial & Spiritual Assessment   Palliative Care Outcomes        Time In: 1020 Time Out: 1135 Time Total: 75 minutes  Greater than 50%  of this time was spent counseling and coordinating care related to the above assessment and plan.  Signed by: Drue Novel,  NP   Please contact Palliative  Medicine Team phone at [phone] for questions and concerns.  For individual provider: See Shea Evans

## 2021-05-17 NOTE — Progress Notes (Signed)
Progress Note   Patient: Michele Lambert GYI:[id number] DOB: 1926/12/27 [name]: 05/14/2021     3 DOS: the patient was seen and examined on 05/17/2021   Brief hospital course: Michele Lambert is a 88 y.o. female with medical history significant of hypertension, hypothyroidism, dementia, CKD stage IIIa, anemia, recent surgery for left hip fracture, recent intracranial parenchymal hemorrhage, who presented on 05/14/2021 with shortness of breath and wet sounding cough after an aspiration event at SNF.    Of note, pt was just discharged to rehab on 2/7 after admission for hip fracture after a fall at her ALF/memory care.  She also had an intracranial parenchymal hemorrhage sustained in that fall but stable neurologic status with no need for intervention.    Admitted and being treated with Unasyn for aspiration pneumonia.  Assessment and Plan: * Aspiration pneumonia (Michele Lambert)- (present on admission) Continue Unasyn. Seen by SLP for swallow evaluation and started on dysphagia 1 (pured) diet with nectar thick liquid by spoon, meds crushed in pure. Monitor respiratory and oxygen status, supplemental O2 if sats below 90% on room air. Supportive care.  Sepsis (Michele Lambert)- (present on admission) Present on admission with leukocytosis, tachycardia and tachypnea in the setting of aspiration episode and pneumonitis/pneumonia. Continue IV antibiotics and follow cultures. Sepsis physiology has improved.  E coli infection Diarrhea was present on admission.   C. difficile negative. GI panel showed E.coli (STEC shiga-LIKE producing) Enteric precautions.   Monitor electrolytes and hydration status. No antibiotics indicated, but on unasyn for aspiration.  This strain is not known risk for HUS, okay to continue Abx and monitor.  2/13: appears diarrhea improving if BM's charted are accurate.   History of fracture of left hip Repaired prior admission, discharged to SNF on 2/7.  Continue PT and OT.  Anticipate return to SNF  if patient is able to participate in therapy.  Fall precautions.  Pain control as needed but avoid narcotics as much as possible given dementia and risk for delirium.  Traumatic intraparenchymal hemorrhage- (present on admission) Sustained in fall which also caused her hip fracture. No significant neurologic symptoms or complications, but she does seem to keep head turned to left. Avoid anticoagulants.   SCDs for VTE prophylaxis. With underlying dementia, expect cognition may worsen.  Elevated troponin- (present on admission) Likely due to demand ischemia in setting of respiratory distress following aspiration event.  Initial troponins were 19 and later 15.  Patient without apparent chest pain.  Chronic kidney disease, stage 3a (Michele Lambert)- (present on admission) Renal function near baseline.  Monitor BMP  Essential hypertension- (present on admission) Home HCTZ and losartan were held on admission in the setting of sepsis and risk of hypotension.  Continue IV hydralazine as needed. BP is stable.  Normocytic anemia- (present on admission) Hemoglobin stable.  Monitor CBC  Hypothyroidism- (present on admission) Continue levothyroxine  Dementia (Michele Lambert)- (present on admission) Patient resides at ALF memory care unit. Delirium and fall precautions. Palliative care consulted for [place] discussions.  Code status is DNR.        Subjective: Pt was awake sitting up in bed when seen this AM. No family present yet. She report feeling okay.  Thinks she may still be having diarrhea but isn't sure about it.  She complains of some abdominal discomfort this morning.  No acute events reported.   Physical Exam: Vitals:   05/16/21 1603 05/17/21 0425 05/17/21 0739 05/17/21 0751  BP: 118/69 (!) 141/83 (!) 152/79 (!) 142/84  Pulse: 100 95 86 86  Resp: 18 18 16 16   Temp: 97.8 F (36.6 C) 98.4 F (36.9 C) 98.5 F (36.9 C) 99 F (37.2 C)  TempSrc: Oral Axillary  Axillary  SpO2: 96% 97% 98% 98%  Weight:       Height:       General exam: awake, alert, no acute distress HEENT: moist mucus membranes, hearing grossly normal  Respiratory system: CTAB, no wheezes, rales or rhonchi, normal respiratory effort. Cardiovascular system: normal S1/S2, RRR, trace BLE edema.   Gastrointestinal system: soft, non-distended, non-tender on palpation +bowel sounds. Central nervous system: no gross focal neurologic deficits, normal speech Skin: dry, intact, normal temperature Psychiatry: normal mood, congruent affect, abnormal judgement and insight due to dementia    Data Reviewed:  Labs reviewed and notable for glucose 111, Ca 7.9, WBC 18.7k, Hbg 8.8 down from 9.6  Family Communication: None at bedside.  Son Telford Nab at bedside past two days.  Spoke with son Gwyndolyn Saxon by phone on 2/12.  Disposition: Status is: Inpatient Remains inpatient appropriate because: Monitoring PO intake for adequate hydration, on IV antibiotics, pending SNF placement for rehab.           Planned Discharge Destination: Skilled nursing facility     Time spent: 35 minutes  Author: Ezekiel Slocumb, DO 05/17/2021 3:20 PM  For on call review [email or web address].

## 2021-05-18 DIAGNOSIS — J690 Pneumonitis due to inhalation of food and vomit: Secondary | ICD-10-CM | POA: Diagnosis not present

## 2021-05-18 DIAGNOSIS — N1831 Chronic kidney disease, stage 3a: Secondary | ICD-10-CM | POA: Diagnosis not present

## 2021-05-18 DIAGNOSIS — Z515 Encounter for palliative care: Secondary | ICD-10-CM | POA: Diagnosis not present

## 2021-05-18 DIAGNOSIS — Z7189 Other specified counseling: Secondary | ICD-10-CM | POA: Diagnosis not present

## 2021-05-18 DIAGNOSIS — A498 Other bacterial infections of unspecified site: Secondary | ICD-10-CM | POA: Diagnosis not present

## 2021-05-18 DIAGNOSIS — Z8781 Personal history of (healed) traumatic fracture: Secondary | ICD-10-CM | POA: Diagnosis not present

## 2021-05-18 LAB — CBC
HCT: 30.3 % — ABNORMAL LOW (ref 36.0–46.0)
Hemoglobin: 9.6 g/dL — ABNORMAL LOW (ref 12.0–15.0)
MCH: 28.7 pg (ref 26.0–34.0)
MCHC: 31.7 g/dL (ref 30.0–36.0)
MCV: 90.7 fL (ref 80.0–100.0)
Platelets: 363 10*3/uL (ref 150–400)
RBC: 3.34 MIL/uL — ABNORMAL LOW (ref 3.87–5.11)
RDW: 14.6 % (ref 11.5–15.5)
WBC: 15.6 10*3/uL — ABNORMAL HIGH (ref 4.0–10.5)
nRBC: 0 % (ref 0.0–0.2)

## 2021-05-18 LAB — BASIC METABOLIC PANEL
Anion gap: 6 (ref 5–15)
BUN: 25 mg/dL — ABNORMAL HIGH (ref 8–23)
CO2: 26 mmol/L (ref 22–32)
Calcium: 7.8 mg/dL — ABNORMAL LOW (ref 8.9–10.3)
Chloride: 110 mmol/L (ref 98–111)
Creatinine, Ser: 0.92 mg/dL (ref 0.44–1.00)
GFR, Estimated: 58 mL/min — ABNORMAL LOW (ref >60)
Glucose, Bld: 103 mg/dL — ABNORMAL HIGH (ref 70–99)
Potassium: 3.5 mmol/L (ref 3.5–5.1)
Sodium: 142 mmol/L (ref 135–145)

## 2021-05-18 LAB — MAGNESIUM: Magnesium: 2.1 mg/dL (ref 1.7–2.4)

## 2021-05-18 MED ORDER — AMOXICILLIN-POT CLAVULANATE 875-125 MG PO TABS
1.0000 | ORAL_TABLET | Freq: Two times a day (BID) | ORAL | Status: AC
  Administered 2021-05-18 (×2): 1 via ORAL
  Filled 2021-05-18 (×2): qty 1

## 2021-05-18 NOTE — TOC Progression Note (Addendum)
Transition of Care [facility]) - Progression Note    Patient Details  Name: Michele Lambert MRN: [id number] Date of Birth: 30-May-1926  Transition of Care [facility]) CM/SW [place], LCSW Phone Number: 05/18/2021, 9:47 AM  Clinical Narrative:   Reviewed bed offers with daughter. She asked that [facility] and [facility] review. Left messages for both admissions coordinators with request.  1:01 pm: [facility] is able to offer a bed. [facility] declined. Left daughter a Advertising account executive.  Expected Discharge Plan: [place] Barriers to Discharge: Continued Medical Work up  Expected Discharge Plan and Services Expected Discharge Plan: [place]   Discharge Planning Services: CM Consult Post Acute Care Choice: [place] Living arrangements for the past 2 months: [place]                                       Social Determinants of Health (SDOH) Interventions    Readmission Risk Interventions No flowsheet data found.

## 2021-05-18 NOTE — Progress Notes (Signed)
Palliative: Michele Lambert is sitting up in the bed in her room.  She has known dementia, but looks to me and smiles.  She is alert, but I am not sure if she can make her basic needs known.  Her daughter, Michele Lambert, is at bedside.  Michele Lambert does seem improved today.  She makes and mostly keeps eye contact, she is able to easily hold her head upright, and is able to move her arms and hands when requested to do so.  Family shares that they are preparing for short-term rehab.  She tells me that she is working closely with transition of care team for choice of facility.  Transition of care team is working closely with family for needs.  Conference with attending, bedside nursing staff, transition of care team related to patient condition, needs, goals of care, disposition.  Plan:   At this point continue to treat the treatable but no CPR or intubation.  Short-term rehab.  Ultimate goal is to return to her ALF.  46 minutes Quinn Axe, NP Palliative medicine team Team phone [phone] Greater than 50% of this time was spent counseling and coordinating care related to the above assessment and plan.

## 2021-05-18 NOTE — Progress Notes (Signed)
Progress Note   Patient: Michele Lambert Michele Lambert DOB: [birth year] [name]: 05/14/2021     4 DOS: the patient was seen and examined on 05/18/2021   Brief hospital course: Michele Lambert is a 88 y.o. female with medical history significant of hypertension, hypothyroidism, dementia, CKD stage IIIa, anemia, recent surgery for left hip fracture, recent intracranial parenchymal hemorrhage, who presented on 05/14/2021 with shortness of breath and wet sounding cough after an aspiration event at SNF.    Of note, pt was just discharged to rehab on 2/7 after admission for hip fracture after a fall at her ALF/memory care.  She also had an intracranial parenchymal hemorrhage sustained in that fall but stable neurologic status with no need for intervention.    Admitted and being treated with Unasyn for aspiration pneumonia.  Assessment and Plan: * Aspiration pneumonia ([place])- (present on admission) Transition Unasyn >> Augmentin to complete 5 day course. . Seen by SLP for swallow evaluation and started on dysphagia 1 (pured) diet with nectar thick liquid by spoon, meds crushed in pure. Monitor respiratory and oxygen status, supplemental O2 if sats below 90% on room air. Supportive care.  Sepsis ([place])- (present on admission) Present on admission with leukocytosis, tachycardia and tachypnea in the setting of aspiration episode and pneumonitis/pneumonia. Sepsis physiology has resolved. Monitor hemodynamics.  Continue antibiotics to complete course for aspiration.    E coli infection Diarrhea was present on admission, non-bloody.   C. difficile negative. GI panel showed E.coli Enteric precautions.   Monitor electrolytes and hydration status.  2/14: appears diarrhea improving   History of fracture of left hip Repaired prior admission, discharged to SNF on 2/7.  Continue PT and OT.  Plan is to return to SNF for rehab.  Fall precautions.  Pain control as needed but avoid narcotics as much as possible given  dementia and risk for delirium.    Traumatic intraparenchymal hemorrhage- (present on admission) Sustained in fall which also caused her hip fracture. No significant neurologic symptoms or complications, but she does seem to keep head turned to left. Avoid anticoagulants.   SCDs for VTE prophylaxis. With underlying dementia, expect cognition may worsen or not recover to prior baseline.  Elevated troponin- (present on admission) Likely due to demand ischemia in setting of respiratory distress following aspiration event.  Initial troponins were 19 and later 15.  Patient without apparent chest pain.  Chronic kidney disease, stage 3a ([place])- (present on admission) Renal function near baseline.   Monitor BMP off fluids.  Essential hypertension- (present on admission) Home HCTZ and losartan were held on admission in the setting of sepsis and risk of hypotension.   BP is stable, mildly elevated. --Resumed losartan --Continue holding HCTZ for now --Continue PRN hydralazine   Normocytic anemia- (present on admission) Hemoglobin stable.  Monitor CBC  Hypothyroidism- (present on admission) Continue levothyroxine  Dementia ([place])- (present on admission) Patient resides at ALF memory care unit. Delirium and fall precautions. Palliative care consulted for [place] discussions.  Code status is DNR.        Subjective: Pt seen with daughter at bedside today.  Pt worked with therapy and reportedly did better today.  Daughter pleased pt doing better and making progress.  Oral intake improved this AM.    Physical Exam: Vitals:   05/16/21 1603 05/17/21 0425 05/17/21 0739 05/17/21 0751  BP: 118/69 (!) 141/83 (!) 152/79 (!) 142/84  Pulse: 100 95 86 86  Resp: 18 18 16 16   Temp: 97.8 F (36.6 C) 98.4  F (36.9 C) 98.5 F (36.9 C) 99 F (37.2 C)  TempSrc: Oral Axillary  Axillary  SpO2: 96% 97% 98% 98%  Weight:      Height:       General exam: awake, alert, no acute distress HEENT: moist  mucus membranes, hearing grossly normal  Respiratory system: normal respiratory effort, on room air. Cardiovascular system: normal S1/S2, trace LE edema.   Central nervous system: no gross focal neurologic deficits, normal speech but minimally conversant due to dementia Skin: dry, intact, no rashes, lesions or ulcers seen on visualized skin Psychiatry: normal mood, congruent affect, judgement and insight appear normal   Data Reviewed:  Labs notable for glucose 103, stable Cr 0.92, Ca 7.8, WBC 18.5>>15.6, Hbg 8.8>>9.6  Family Communication: Daughter at bedside on rounds.  Disposition: Status is: Inpatient Remains inpatient appropriate because: Requires SNF placement for rehab which is pending.          Planned Discharge Destination: Skilled nursing facility     Time spent: 35 minutes  Author: Ezekiel Slocumb, DO 05/18/2021 4:28 PM  For on call review [email or web address].

## 2021-05-18 NOTE — Progress Notes (Signed)
Speech Language Pathology Treatment: Dysphagia  Patient Details Name: Michele Lambert MRN: [id number] DOB: 11-Mar-1927 Today's Date: 05/18/2021 Time: 0930-1030 SLP Time Calculation (min) (ACUTE ONLY): 60 min  Assessment / Plan / Recommendation Clinical Impression  Pt seen today for ongoing assessment of pt's swallowing, toleration of dysphagia diet including Nectar consistency liquids(recent upgrade to Nectar liquids by TSP in diet yesterday). Daughter present reported she seemed to tolerate po's last evening w/out overt coughing noted. No obvious lethargy though she liked to keep her eyes closed during session/often -- suspect impact from Baseline Dementia(trying to shut out extra stimulation). Pt was resting but fully awakened w/ positioned Upright for po's. Oral care completed w/ debris removed from oral cavity.    At this assessment, pt continued to exhibit min Left head turn preference; towel rolls and pillow were provided along Left side to support pt/head in midline -- this education on supportive towel rolls was demonstrated to the Daughter present. Speech intelligibility was fair-good w/ words spoken -- min mumbled as well(which might be impact from Cognitive decline as well). Pt's speech was much improved per Daughter.     Pt presented w/ oropharyngeal phase dysphagia c/b MIN-MOD oral phase deficits including: decreased bolus manipulation and increased oral phase time w/ slow, deliberate A-P transfer, min oral holding. Any decline in Cognitive status, baseline Dementia, can impact overall oral awareness/engagement w/ po tasks which increases risk for aspiration. Pt is at MOD risk for aspiration d/t impact of Baseline Dementia, recent acute CVA(basal ganglia) and Fall w/ hip fx/repair, advanced age, medications, and deconditioned State. Risk can be reduced somewhat when following aspiration precautions, given feeding support and cues, and when using a modified Dysphagia diet including Nectar  consistency liquids via tsp. She requires MOD oral, tactile/verbal/visual cues and monitoring w/ all oral intake.    Pt consumed trials of Nectar liquids via TSP and Purees of am meal (~75+% overall) w/ No overt coughing or throat clearing noted; no decline in respiratory status during/post trials. Mild wet vocal quality noted b/t trials x1 which pt appeared to clear w/ subsequent swallowing. HOWEVER, there is concern for pharyngeal phase deficits d/t the degree of Cognitive decline w/ decreased attention/awareness, as well as the oral phase deficits/dysphagia as described above. Oral phase was lengthy, and she required MOD visual/verbal/tactile cues at lips to open mouth to take bolus (prep) and cues/Time to swallow and clear the boluses fully. Pt required time to use lingual sweeping and "get ready" for next bolus; light tactile cues to lower lip w/ verbal cue "ready" were successful in preparing pt for the next bolus -- she opened mouth to cues readily. She was encouraged also to engage in self-feeding, but overall, FULL feeding support given w/ foods -- she kept her eyes closed the majority of the meal/intake. No trials of solids nor thin liquids were assessed d/t risk for aspiration, choking -- not recommended currently but can be addressed during future Rehab.   D/t overall medical and Cognitive presentations, and risk for aspiration from oropharyngeal phase deficits, recommend continue a Dysphagia level 1(PUREE foods moistened well) w/ Nectar liquids VIA TSP -- no straws. Aspiration precautions including reduce Distractions during meals and engage pt in self-feeding w/ support offered, check for oral clearing during/post intake as needed, cues for oral Prep/acceptance of po boluses and cues to swallow/clear and alternate foods/liquids. Pills Crushed in Puree for safer swallowing. NSG/MD updated.  Recommend ST services f/u at SNF/Rehab for ongoing assessment, education, and any trial upgrade of  diet post  the Acuity of this illness/admit secondary to Baseline Dementia/Cognitive decline. Recommend f/u w/ Palliative Care services for support and GOC.    Thorough Education given to Daughter present this tx session; handouts. Discussed diet consistency of foods/thickened liquids(Nectar) for now and at Rehab; aspiration precautions; pills Crushed in puree; feeding support and Supervision; tactile/verbal and visual cues to take po's/Prep and education on monitoring swallowing; dysphagia in general for her Mother based on her Cognitive and medical issues and her risk for aspiration w/ Pulmonary impact from the aspiration. Precautions posted in room on the above as discussed w/ Dtr/NSG.        HPI HPI: Per admitting H & P "Michele Lambert is a 88 y.o. female with medical history significant of hypertension, hypothyroidism, dementia, CKD stage IIIa, anemia, recent surgery for left hip fracture, recent intracranial parenchymal hemorrhage, who presents SOB.     Patient has dementia, and cannot provide likely with medical history.  Per his son at the bedside, at normal baseline, patient recognizes her son sometimes, but most of the time pt is not orientated to place and time. Pt's mental status is close to baseline per her son. Patient was recently hospitalized from 2/3 - 2/7 due to left hip fracture and intracranial parenchymal hemorrhage after fall. She under went left hip surgery and is currently doing rehab in SNF. Pt was found to have SOB and cough with little mucus production in facility.  Patient has gurgling sound, and seem to have difficulty swallowing.  Does not seem to have chest pain.  No fever or chills.  No active nausea, vomiting, diarrhea noted.  Patient moves all extremities." On previous admission Pt was placxed on Dys 1 diet with nectar thick liqids by TSP both admissions in order to address Dysphagia and safe oral intake to meet nutritional needs.      SLP Plan  Continue with current plan of care  (monitor status)      Recommendations for follow up therapy are one component of a multi-disciplinary discharge planning process, led by the attending physician.  Recommendations may be updated based on patient status, additional functional criteria and insurance authorization.    Recommendations  Diet recommendations: Dysphagia 1 (puree);Nectar-thick liquid Liquids provided via: Teaspoon (only) Medication Administration: Crushed with puree Supervision: Staff to assist with self feeding;Full supervision/cueing for compensatory strategies Compensations: Minimize environmental distractions;Slow rate;Small sips/bites;Lingual sweep for clearance of pocketing;Follow solids with liquid;Multiple dry swallows after each bite/sip Postural Changes and/or Swallow Maneuvers: Out of bed for meals;Seated upright 90 degrees;Upright 30-60 min after meal                General recommendations:  (Dietician f/u; Palliative Care f/u for [place] discussion/support at next venue of care) Oral Care Recommendations: Oral care BID;Oral care before and after PO;Staff/trained caregiver to provide oral care Follow Up Recommendations: Skilled nursing-short term rehab (<3 hours/day) Assistance recommended at discharge: Frequent or constant Supervision/Assistance SLP Visit Diagnosis: Dysphagia, oropharyngeal phase (R13.12) (recent stroke; Fall w/ hip fx; Baseline Dementia) Plan: Continue with current plan of care (monitor status)             Orinda Kenner, [place], [place]; [place] [phone] (ascom) Diontay Rosencrans  05/18/2021, 3:25 PM

## 2021-05-18 NOTE — Progress Notes (Signed)
OT Cancellation Note  Patient Details Name: Michele Lambert MRN: [id number] DOB: 09-Aug-1926   Cancelled Treatment:    Reason Eval/Treat Not Completed: Fatigue/lethargy limiting ability to participate. Pt lethargic, unable to wake for session. Will re-attempt OT tx at later date/time as able to participate.   Ardeth Perfect., MPH, MS, OTR/L ascom [phone] 05/18/21, 3:46 PM

## 2021-05-19 DIAGNOSIS — F0153 Vascular dementia, unspecified severity, with mood disturbance: Secondary | ICD-10-CM

## 2021-05-19 DIAGNOSIS — N1831 Chronic kidney disease, stage 3a: Secondary | ICD-10-CM | POA: Diagnosis not present

## 2021-05-19 DIAGNOSIS — J690 Pneumonitis due to inhalation of food and vomit: Secondary | ICD-10-CM | POA: Diagnosis not present

## 2021-05-19 DIAGNOSIS — A498 Other bacterial infections of unspecified site: Secondary | ICD-10-CM | POA: Diagnosis not present

## 2021-05-19 DIAGNOSIS — E038 Other specified hypothyroidism: Secondary | ICD-10-CM

## 2021-05-19 LAB — CULTURE, BLOOD (ROUTINE X 2)
Culture: NO GROWTH
Culture: NO GROWTH

## 2021-05-19 LAB — BASIC METABOLIC PANEL
Anion gap: 5 (ref 5–15)
BUN: 29 mg/dL — ABNORMAL HIGH (ref 8–23)
CO2: 25 mmol/L (ref 22–32)
Calcium: 7.9 mg/dL — ABNORMAL LOW (ref 8.9–10.3)
Chloride: 112 mmol/L — ABNORMAL HIGH (ref 98–111)
Creatinine, Ser: 1.01 mg/dL — ABNORMAL HIGH (ref 0.44–1.00)
GFR, Estimated: 52 mL/min — ABNORMAL LOW (ref >60)
Glucose, Bld: 117 mg/dL — ABNORMAL HIGH (ref 70–99)
Potassium: 3.7 mmol/L (ref 3.5–5.1)
Sodium: 142 mmol/L (ref 135–145)

## 2021-05-19 LAB — CBC
HCT: 28.9 % — ABNORMAL LOW (ref 36.0–46.0)
Hemoglobin: 9.2 g/dL — ABNORMAL LOW (ref 12.0–15.0)
MCH: 28 pg (ref 26.0–34.0)
MCHC: 31.8 g/dL (ref 30.0–36.0)
MCV: 87.8 fL (ref 80.0–100.0)
Platelets: 363 10*3/uL (ref 150–400)
RBC: 3.29 MIL/uL — ABNORMAL LOW (ref 3.87–5.11)
RDW: 14.6 % (ref 11.5–15.5)
WBC: 16.3 10*3/uL — ABNORMAL HIGH (ref 4.0–10.5)
nRBC: 0 % (ref 0.0–0.2)

## 2021-05-19 LAB — SARS CORONAVIRUS 2 (TAT 6-24 HRS): SARS Coronavirus 2: NEGATIVE

## 2021-05-19 NOTE — Progress Notes (Signed)
Physical Therapy Treatment Patient Details Name: Michele Lambert MRN: [id number] DOB: 1926-06-28 Today's Date: 05/19/2021   History of Present Illness Pt is a 88 y/o F admitted on 05/14/21 with c/c of SOB. Pt is being treated for sepsis 2/2 aspiration PNA. Pt with recent hospitalization following a fall resulting in L intertrochanteric vx & L IM nail fixation on 05/07/21 by Dr. 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 PMH: HTN, hypothryoidism, dementia, CKD stage 3A    PT Comments    Pt more alert this session and contributed more with below therex and therapeutic activities.  Pt continued to require extensive +2 assist, however, with all functional tasks and was unable to maintain standing position without constant +2 assist.  Pt is at a very high risk for falls and further functional decline and would not be safe to return to her prior living situation at this time.  Pt will benefit from a trial of PT services in a SNF setting upon discharge to safely address deficits listed in patient problem list for decreased caregiver assistance and eventual return to PLOF.    Recommendations for follow up therapy are one component of a multi-disciplinary discharge planning process, led by the attending physician.  Recommendations may be updated based on patient status, additional functional criteria and insurance authorization.  Follow Up Recommendations  Skilled nursing-short term rehab (<3 hours/day)     Assistance Recommended at Discharge Frequent or constant Supervision/Assistance  Patient can return home with the following Two people to help with walking and/or transfers;Two people to help with bathing/dressing/bathroom;Direct supervision/assist for medications management;Help with stairs or ramp for entrance;Assistance with feeding;Assist for transportation;Direct supervision/assist  for financial management;Assistance with cooking/housework   Equipment Recommendations  None recommended by PT    Recommendations for Other Services       Precautions / Restrictions Precautions Precautions: Fall Precaution Comments: L inattention Restrictions Weight Bearing Restrictions: Yes LLE Weight Bearing: Weight bearing as tolerated Other Position/Activity Restrictions: Pt favoring cervical rotation to the L with concern of beginning stages of contracture     Mobility  Bed Mobility Overal bed mobility: Needs Assistance Bed Mobility: Supine to Sit, Sit to Supine Rolling: Max assist, +2 for physical assistance   Supine to sit: Mod assist, HOB elevated, +2 for physical assistance Sit to supine: Max assist, +2 for physical assistance   General bed mobility comments: +2 extensive assist with bed mobility tasks for BLE and trunk control    Transfers Overall transfer level: Needs assistance Equipment used: Rolling walker (2 wheels) Transfers: Sit to/from Stand Sit to Stand: +2 physical assistance, Max assist           General transfer comment: Pt required +2 extensive assist to clear the surface of the bed but unable to come to full upright standing or to remain in standing without heavy constant assist    Ambulation/Gait               General Gait Details: Unable   Stairs             Wheelchair Mobility    Modified Rankin (Stroke Patients Only)       Balance Overall balance assessment: Needs assistance Sitting-balance support: Bilateral upper extremity supported, Feet supported Sitting balance-Leahy Scale: Fair     Standing balance support: Bilateral upper extremity supported Standing balance-Leahy Scale: Zero  Cognition Arousal/Alertness: Awake/alert Behavior During Therapy: Flat affect Overall Cognitive Status: History of cognitive impairments - at baseline                                           Exercises Total Joint Exercises Short Arc Quad: AROM, AAROM, Strengthening, Both, 5 reps, 10 reps Heel Slides: AAROM, Strengthening, Both, 5 reps, 10 reps Hip ABduction/ADduction: AAROM, Strengthening, Both, 5 reps, 10 reps Straight Leg Raises: AAROM, Strengthening, Both, 5 reps, 10 reps Other Exercises Other Exercises: Rolling L/R training with max multi-modal cues for sequencing Other Exercises: Static sitting at EOB for increased core strength and acvitity tolerance    General Comments        Pertinent Vitals/Pain Pain Assessment Pain Assessment: PAINAD Breathing: normal Negative Vocalization: none Facial Expression: smiling or inexpressive Body Language: relaxed Consolability: no need to console PAINAD Score: 0    Home Living                          Prior Function            PT Goals (current goals can now be found in the care plan section) Progress towards PT goals: PT to reassess next treatment    Frequency    Min 2X/week      PT Plan Current plan remains appropriate    Co-evaluation              AM-PAC PT "6 Clicks" Mobility   Outcome Measure  Help needed turning from your back to your side while in a flat bed without using bedrails?: Total Help needed moving from lying on your back to sitting on the side of a flat bed without using bedrails?: Total Help needed moving to and from a bed to a chair (including a wheelchair)?: Total Help needed standing up from a chair using your arms (e.g., wheelchair or bedside chair)?: Total Help needed to walk in hospital room?: Total Help needed climbing 3-5 steps with a railing? : Total 6 Click Score: 6    End of Session Equipment Utilized During Treatment: Gait belt Activity Tolerance: Patient tolerated treatment well Patient left: in bed;with call bell/phone within reach;with bed alarm set;with nursing/sitter in room Nurse Communication: Mobility status PT Visit  Diagnosis: Muscle weakness (generalized) (M62.81);Difficulty in walking, not elsewhere classified (R26.2);Pain Pain - Right/Left: Left Pain - part of body: Hip     Time: 0623-7628 PT Time Calculation (min) (ACUTE ONLY): 23 min  Charges:  $Therapeutic Exercise: 8-22 mins $Therapeutic Activity: 8-22 mins                     D. Scott Kannan Proia PT, DPT 05/19/21, 4:14 PM

## 2021-05-19 NOTE — Progress Notes (Signed)
Occupational Therapy Treatment Patient Details Name: Michele Lambert MRN: [id number] DOB: [birth year] Today's Date: 05/19/2021   History of present illness Pt is a 88 y/o F admitted on 05/14/21 with c/c of SOB. Pt is being treated for sepsis 2/2 aspiration PNA. Pt with recent hospitalization following a fall resulting in L intertrochanteric vx & L IM nail fixation on 05/07/21 by Dr. 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 PMH: HTN, hypothryoidism, dementia, CKD stage 3A   OT comments  Pt seen for OT tx this date to f/u re: safety with ADLs/ADL mobility. OT engages pt in sup to sit with MOD A with HOB elevated. Pt demos G sitting balance in EOB sitting with feet supported. Pt requires MOD A for seated UB bathing tasks. She tolerates static EOB sitting ~10-15 minutes while engaging in ADLs and gentle cervical AAROM. Pt returned to bed with MAX A with all needs met and in reach.    Recommendations for follow up therapy are one component of a multi-disciplinary discharge planning process, led by the attending physician.  Recommendations may be updated based on patient status, additional functional criteria and insurance authorization.    Follow Up Recommendations  Skilled nursing-short term rehab (<3 hours/day)    Assistance Recommended at Discharge Frequent or constant Supervision/Assistance  Patient can return home with the following  Two people to help with walking and/or transfers;A lot of help with bathing/dressing/bathroom;Help with stairs or ramp for entrance;Assistance with feeding;Direct supervision/assist for medications management;Assist for transportation;Assistance with cooking/housework   Equipment Recommendations  BSC/3in1    Recommendations for Other Services      Precautions / Restrictions Precautions Precautions: Fall Precaution Comments: L  inattention Restrictions Weight Bearing Restrictions: Yes LLE Weight Bearing: Weight bearing as tolerated Other Position/Activity Restrictions: Pt favoring cervical rotation to the L with concern of beginning stages of contracture       Mobility Bed Mobility Overal bed mobility: Needs Assistance Bed Mobility: Supine to Sit, Sit to Supine     Supine to sit: Mod assist, HOB elevated Sit to supine: Max assist   General bed mobility comments: incrased time and cues for sup to sit to use UE with bed rails to meaningfully contribute    Transfers                   General transfer comment: attempted to STS with arm in arm with MAX A with knee blocking from slightly elevated EOB Surface, while pt can be felt trying to make some contribution, she is essentually unable to clear bottom from bed w/o 2p assist.     Balance Overall balance assessment: Needs assistance Sitting-balance support: Bilateral upper extremity supported, Feet supported Sitting balance-Leahy Scale: Fair Sitting balance - Comments: F static sitting                                   ADL either performed or assessed with clinical judgement   ADL Overall ADL's : Needs assistance/impaired     Grooming: Minimal assistance;Sitting;Cueing for sequencing Grooming Details (indicate cue type and reason): tactile cues to initiate while in EOB Sitting Upper Body Bathing: Moderate assistance;Sitting  Extremity/Trunk Assessment              Vision       Perception     Praxis      Cognition Arousal/Alertness: Awake/alert (more awake, but requries stimuli to attend) Behavior During Therapy: Flat affect Overall Cognitive Status: History of cognitive impairments - at baseline                                 General Comments: able to follow ~50% of simple one step commands with tactile cues to help initiate.        Exercises  Other Exercises Other Exercises: OT engages pt in gentle AAROM of cervical spine while in EOB sitting with moderate tactile and verbal cues to track/gaze to the right. Encouraged all other functional planes as tolerated with moderate cues and gentle assistance.    Shoulder Instructions       General Comments      Pertinent Vitals/ Pain       Pain Assessment Pain Assessment: Faces Faces Pain Scale: Hurts a little bit Pain Location: grimmaces with attempts to gently stretch and encourage neck ROM Pain Descriptors / Indicators: Grimacing, Discomfort Pain Intervention(s): Monitored during session, Repositioned  Home Living                                          Prior Functioning/Environment              Frequency  Min 2X/week        Progress Toward Goals  OT Goals(current goals can now be found in the care plan section)     Acute Rehab OT Goals Patient Stated Goal: to go to rehab OT Goal Formulation: With family Time For Goal Achievement: 05/29/21 Potential to Achieve Goals: [place] Discharge plan remains appropriate    Co-evaluation                 AM-PAC OT "6 Clicks" Daily Activity     Outcome Measure   Help from another person eating meals?: A Lot Help from another person taking care of personal grooming?: Total Help from another person toileting, which includes using toliet, bedpan, or urinal?: Total Help from another person bathing (including washing, rinsing, drying)?: A Lot Help from another person to put on and taking off regular upper body clothing?: A Lot Help from another person to put on and taking off regular lower body clothing?: Total 6 Click Score: 9    End of Session Equipment Utilized During Treatment: Gait belt  OT Visit Diagnosis: History of falling (Z91.81);Other abnormalities of gait and mobility (R26.89);Other symptoms and signs involving cognitive function;Muscle weakness (generalized)  (M62.81);Pain Pain - Right/Left: Right Pain - part of body: Shoulder   Activity Tolerance Patient tolerated treatment well   Patient Left in bed;with call bell/phone within reach;with bed alarm set;with family/visitor present   Nurse Communication Mobility status        Time: 1428-1500 OT Time Calculation (min): 32 min  Charges: OT General Charges $OT Visit: 1 Visit OT Treatments $Self Care/Home Management : 8-22 mins $Therapeutic Activity: 8-22 mins  Gerrianne Scale, MS, OTR/L ascom [phone] 05/19/21, 4:48 PM

## 2021-05-19 NOTE — TOC Progression Note (Signed)
Transition of Care [facility]) - Progression Note    Patient Details  Name: Michele Lambert MRN: [id number] Date of Birth: September 24, 1926  Transition of [facility]) CM/SW Contact  Beverly Sessions, RN Phone Number: 05/19/2021, 10:55 AM  Clinical Narrative:    Presented bed offers to daughter and son.  They have accepted [facility].  Accepted in [place].  Per Magda Paganini at [facility] they can admit tomorrow.  MD and family updated.  MD to order covid test.  Signed DNR on chart    Expected Discharge Plan: [place] Barriers to Discharge: Continued Medical Work up  Expected Discharge Plan and Services Expected Discharge Plan: [place]   Discharge Planning Services: CM Consult Post Acute Care Choice: [place] arrangements for the past 2 months: [place]                                       Social Determinants of Health (SDOH) Interventions    Readmission Risk Interventions No flowsheet data found.

## 2021-05-19 NOTE — Discharge Summary (Signed)
Physician Discharge Summary  Michele Lambert RKY:[id number] DOB: [birth year] [name]: 05/14/2021  PCP: Dion Body, MD  Admit date: 05/14/2021 Discharge date: 05/20/2021  Admitted From:  memory care facility Disposition: Skilled nursing facility  Recommendations for Outpatient Follow-up:  Follow up with PCP within 1-2 weeks Consider probiotic for post antibiotic recovery Follow up with ortho surgery Hip fracture healing evaluation  Discharge Condition:stable CODE STATUS:  Code Status: DNR  Regular healthy diet  Brief/Interim Summary: Michele Lambert is a 88 y.o. female with medical history significant of hypertension, hypothyroidism, dementia, CKD stage IIIa, anemia, recent surgery for left hip fracture, recent intracranial parenchymal hemorrhage, who presented on 05/14/2021 with shortness of breath and wet sounding cough after an aspiration event at SNF.     Of note, pt was just discharged to rehab on 2/7 after admission for hip fracture after a fall at her ALF/memory care.  She also had an intracranial parenchymal hemorrhage sustained in that fall but stable neurologic status with no need for intervention. She did not complain of hip pain throughout this admission.    This admission, patient was found to have aspiration pneumonia. She did not have an oxygen requirement but met sepsis criteria with tachycardia, tachypnea, leukocytosis. She completed a full course of antibiotics to include unasyn and then transitioned to augmentin. She tolerated the treatment well.  Additionally, patient had episodes of diarrhea that were tested positive for e. Coli. This is likely a result of antibiotic side effect as her symptoms resolved with completion of her treatment.   Throughout the admission, she worked with PT/OT/SLP who recommended further acute rehab at [facility].   All other chronic conditions were treated with home medications.    Discharge Diagnoses:  Principal Problem:   Aspiration pneumonia  ([place]) Active Problems:   Hypothyroidism   Dementia ([place])   Traumatic intraparenchymal hemorrhage   Essential hypertension   History of fracture of left hip   Sepsis (HCC)   Elevated troponin   Chronic kidney disease, stage 3a (HCC)   Normocytic anemia   E coli infection    Allergies as of 05/20/2021       Reactions   Codeine    Lescol [fluvastatin Sodium]         Medication List     STOP taking these medications    enoxaparin 40 MG/0.4ML injection Commonly known as: LOVENOX   hydrochlorothiazide 25 MG tablet Commonly known as: HYDRODIURIL   traMADol 50 MG tablet Commonly known as: ULTRAM   traZODone 50 MG tablet Commonly known as: DESYREL       TAKE these medications    acetaminophen 650 MG CR tablet Commonly known as: TYLENOL Take 650 mg by mouth every 8 (eight) hours as needed.   Calcium Carb-Cholecalciferol 600-10 MG-MCG Tabs Take 1 tablet by mouth 2 (two) times daily.   levothyroxine 125 MCG tablet Commonly known as: SYNTHROID Take 125 mcg by mouth every morning.   losartan 50 MG tablet Commonly known as: COZAAR Take 50 mg by mouth every evening.   polyethylene glycol 17 g packet Commonly known as: MIRALAX / GLYCOLAX Take 17 g by mouth daily. What changed:  when to take this reasons to take this        Contact information for after-discharge care     [facility]erred [facility] .   Service: Skilled Chiropodist information: [place] [phone]  Allergies  Allergen Reactions   Codeine    Lescol [Fluvastatin Sodium]     Consultations: None   Procedures/Studies: DG Chest 2 View  Result Date: 05/14/2021 CLINICAL DATA:  Cough EXAM: CHEST - 2 VIEW COMPARISON:  05/07/2021 FINDINGS: Stable cardiomegaly. Chronic interstitial changes. Increased retrocardiac density  probably reflects atelectasis. There may be hiatal hernia. No pleural effusion. IMPRESSION: Increased retrocardiac density probably reflects atelectasis. There may be a hiatal hernia. Electronically Signed   By: Macy Mis M.D.   On: 05/14/2021 10:27   CT HEAD WO CONTRAST (5MM)  Result Date: 05/07/2021 CLINICAL DATA:  Intraparenchymal hemorrhage. EXAM: CT HEAD WITHOUT CONTRAST TECHNIQUE: Contiguous axial images were obtained from the base of the skull through the vertex without intravenous contrast. RADIATION DOSE REDUCTION: This exam was performed according to the departmental dose-optimization program which includes automated exposure control, adjustment of the mA and/or kV according to patient size and/or use of iterative reconstruction technique. COMPARISON:  Of same day. FINDINGS: Brain: Grossly stable size and appearance of right basal ganglia hemorrhage with hemorrhage present in bilateral lateral and third ventricles as well. Stable ventricular size is noted. Cortical atrophy is again noted. No mass effect or midline shift is noted. No new hemorrhage is noted. Vascular: No hyperdense vessel or unexpected calcification. Skull: Normal. Negative for fracture or focal lesion. Sinuses/Orbits: No acute finding. Other: None. IMPRESSION: Stable size and appearance of right basal ganglia intraparenchymal hemorrhage as well as intraventricular hemorrhage involving both lateral ventricles and third ventricle. Electronically Signed   By: Marijo Conception M.D.   On: 05/07/2021 16:01   CT Head Wo Contrast  Result Date: 05/07/2021 CLINICAL DATA:  Fall, intracranial hemorrhage EXAM: CT HEAD WITHOUT CONTRAST TECHNIQUE: Contiguous axial images were obtained from the base of the skull through the vertex without intravenous contrast. RADIATION DOSE REDUCTION: This exam was performed according to the departmental dose-optimization program which includes automated exposure control, adjustment of the mA and/or kV  according to patient size and/or use of iterative reconstruction technique. COMPARISON:  CT head obtained earlier the same day FINDINGS: Brain: Again seen is small volume intraparenchymal hemorrhage centered in the right thalamus and anterior limb of the internal capsule. There is no significant regional mass effect. Intraventricular blood in the bodies of the lateral ventricles, third ventricle, and layering in the occipital horns is again seen. Blood in the occipital horns appears slightly increased from the prior study, though this may reflect redistribution as previously seen blood in the right temporal horn is no longer seen. Blood elsewhere appears unchanged. The ventricular system is unchanged in size and configuration, with no evidence of developing hydrocephalus. Background parenchymal volume is unchanged. Patchy hypodensity in the subcortical and periventricular white matter likely reflecting sequela of chronic white matter microangiopathy is unchanged. There is no evidence of infarct. Vascular: There is calcification of the bilateral cavernous ICAs. Skull: Normal. Negative for fracture or focal lesion. Sinuses/Orbits: The imaged paranasal sinuses are clear. Bilateral lens implants are in place. The globes and orbits are otherwise unremarkable. Other: None. IMPRESSION: 1. Intraventricular blood layering in the occipital horns appears slightly increased since the prior study, though this may reflect redistribution. Additional intraventricular and intraparenchymal blood products are not significantly changed. 2. Stable size and configuration of the ventricular system. Electronically Signed   By: Valetta Mole M.D.   On: 05/07/2021 09:24   CT Head Wo Contrast  Result Date: 05/07/2021 CLINICAL DATA:  Head trauma, fall EXAM: CT HEAD WITHOUT CONTRAST TECHNIQUE: Contiguous axial  images were obtained from the base of the skull through the vertex without intravenous contrast. RADIATION DOSE REDUCTION: This exam  was performed according to the departmental dose-optimization program which includes automated exposure control, adjustment of the mA and/or kV according to patient size and/or use of iterative reconstruction technique. COMPARISON:  08/07/2020 FINDINGS: Brain: Intraparenchymal hemorrhage centered in the right basal ganglia. Minimal hypodensity surrounding the basal ganglia portion of the hemorrhage, likely edema. Intraventricular extension into the right-greater-than-left lateral ventricle and third ventricle. Small amount of hemorrhage in the occipital horn of the left lateral ventricle and temporal horn of the right lateral ventricle. Unchanged size and configuration of the ventricles. No significant mass effect or midline shift. No acute infarct or mass. No additional extra-axial collection. Periventricular white matter changes, likely the sequela of chronic small vessel ischemic disease. Vascular: No hyperdense vessel. Skull: Normal. Negative for fracture or focal lesion. Sinuses/Orbits: Bubbly fluid in the right sphenoid sinus. Status post bilateral lens replacements. Other: The mastoids are well aerated. IMPRESSION: 1. Intraparenchymal hemorrhage centered in the right basal ganglia, with intraventricular extension into the right-greater-than-left lateral ventricle and third ventricle, without evidence of hydrocephalus, significant mass effect, or midline shift. 2. Bubbly fluid in the right sphenoid sinus, as can be seen in the setting of sinusitis. Correlate with symptoms. These results were called by telephone at the time of interpretation on 05/07/2021 at 3:07 am to provider JADE SUNG , who verbally acknowledged these results. Electronically Signed   By: Merilyn Baba M.D.   On: 05/07/2021 03:10   DG Chest Port 1 View  Result Date: 05/07/2021 CLINICAL DATA:  Recent fall with chest pain, initial encounter EXAM: PORTABLE CHEST 1 VIEW COMPARISON:  08/07/2020 FINDINGS: Cardiac shadow is enlarged but stable.  Aortic calcifications are noted. Distal left clavicular fracture is seen. The lungs are clear bilaterally. No other fractures are noted. IMPRESSION: Distal left clavicular fracture. No acute abnormality is noted in the chest. Electronically Signed   By: Inez Catalina M.D.   On: 05/07/2021 03:20   DG C-Arm 1-60 Min-No Report  Result Date: 05/07/2021 CLINICAL DATA:  Left hip fracture. EXAM: DG HIP (WITH OR WITHOUT PELVIS) 2-3V LEFT; DG C-ARM 1-60 MIN-NO REPORT Fluoroscopy time: 1 minute 38 seconds. COMPARISON:  Same day. FINDINGS: Four intraoperative fluoroscopic images were obtained. These demonstrate intramedullary rod fixation proximal left femoral intertrochanteric fracture. Improved alignment of fracture components is noted. IMPRESSION: Fluoroscopic guidance provided during intramedullary rod fixation of left femur for treatment of proximal left femoral intertrochanteric fracture. Electronically Signed   By: Marijo Conception M.D.   On: 05/07/2021 14:37   DG HIP UNILAT WITH PELVIS 2-3 VIEWS LEFT  Result Date: 05/07/2021 CLINICAL DATA:  Left hip fracture. EXAM: DG HIP (WITH OR WITHOUT PELVIS) 2-3V LEFT; DG C-ARM 1-60 MIN-NO REPORT Fluoroscopy time: 1 minute 38 seconds. COMPARISON:  Same day. FINDINGS: Four intraoperative fluoroscopic images were obtained. These demonstrate intramedullary rod fixation proximal left femoral intertrochanteric fracture. Improved alignment of fracture components is noted. IMPRESSION: Fluoroscopic guidance provided during intramedullary rod fixation of left femur for treatment of proximal left femoral intertrochanteric fracture. Electronically Signed   By: Marijo Conception M.D.   On: 05/07/2021 14:37   DG Hip Unilat With Pelvis 2-3 Views Left  Result Date: 05/07/2021 CLINICAL DATA:  Recent fall with left hip pain, initial encounter EXAM: DG HIP (WITH OR WITHOUT PELVIS) 3V LEFT COMPARISON:  None. FINDINGS: Pelvic ring is intact. Healed fractures are noted in the superior and  inferior pubic rami on the right. Comminuted left intratrochanteric fracture is noted with impaction and angulation at the fracture site. No dislocation is seen. IMPRESSION: Comminuted left intratrochanteric fracture as described. Old healed fractures are noted involving the pubic rami on the right. Electronically Signed   By: Inez Catalina M.D.   On: 05/07/2021 03:27   DG FEMUR PORT MIN 2 VIEWS LEFT  Result Date: 05/07/2021 CLINICAL DATA:  Post hip fracture intramedullary nailing. EXAM: LEFT FEMUR PORTABLE 2 VIEWS COMPARISON:  May 07, 2021 preoperative evaluation. FINDINGS: Post antegrade nailing of a comminuted intratrochanteric fracture that was seen previously with placement of a intramedullary rod and hip screw crossing the femoral neck. Near anatomic alignment. Distal interlocking screws are well noted as well, 2 distal interlocking screws. No unexpected findings with gas in the local soft tissues and skin staples over the LEFT hip and lower thigh. IMPRESSION: Post antegrade nailing of a comminuted intertrochanteric fracture, in near anatomic alignment and no unexpected findings. Electronically Signed   By: Zetta Bills M.D.   On: 05/07/2021 15:17    Subjective: Patient states that she feels good today. She has no complaints or concerns. Denies hip pain. Her son and daughter at bedside. All questions and concerns were addressed at time of discharge.   Discharge Exam: Vitals:   05/19/21 1607 05/20/21 0444  BP: (!) 148/70 (!) 151/83  Pulse: (!) 102 99  Resp: 20 17  Temp: 99 F (37.2 C) 98.5 F (36.9 C)  SpO2: 95% 96%    General: Pt is alert, awake, not in acute distress Cardiovascular: RRR, S1/S2 +, no rubs, no gallops Respiratory: CTA bilaterally, no wheezing, no rhonchi Abdominal: Soft, NT, ND Extremities: no edema, no cyanosis. No tenderness to palpation of left hip.   Labs: Basic Metabolic Panel: Recent Labs  Lab 05/14/21 0951 05/16/21 0325 05/17/21 0538 05/18/21 0545  05/19/21 0454  NA 138 139 141 142 142  K 3.9 3.1* 3.6 3.5 3.7  CL 106 105 106 110 112*  CO2 '26 25 26 26 25  ' GLUCOSE 129* 138* 111* 103* 117*  BUN 36* 25* 22 25* 29*  CREATININE 0.97 0.73 0.76 0.92 1.01*  CALCIUM 7.8* 8.0* 7.9* 7.8* 7.9*  MG  --   --   --  2.1  --    CBC: Recent Labs  Lab 05/14/21 0951 05/15/21 0411 05/16/21 0325 05/17/21 0538 05/18/21 0545 05/19/21 0454  WBC 20.7* 21.5* 20.0* 18.7* 15.6* 16.3*  NEUTROABS 17.1*  --   --   --   --   --   HGB 9.6* 10.7* 9.6* 8.8* 9.6* 9.2*  HCT 29.5* 33.6* 28.8* 27.3* 30.3* 28.9*  MCV 88.1 89.1 88.6 86.9 90.7 87.8  PLT 326 351 335 367 363 363    Microbiology Recent Results (from the past 240 hour(s))  Resp Panel by RT-PCR (Flu A&B, Covid) Nasopharyngeal Swab     Status: None   Collection Time: 05/10/21  6:00 PM   Specimen: Nasopharyngeal Swab; Nasopharyngeal(NP) swabs in vial transport medium  Result Value Ref Range Status   SARS Coronavirus 2 by RT PCR NEGATIVE NEGATIVE Final    Comment: (NOTE) SARS-CoV-2 target nucleic acids are NOT DETECTED.  The SARS-CoV-2 RNA is generally detectable in upper respiratory specimens during the acute phase of infection. The lowest concentration of SARS-CoV-2 viral copies this assay can detect is 138 copies/mL. A negative result does not preclude SARS-Cov-2 infection and should not be used as the sole basis for treatment or other patient management  decisions. A negative result may occur with  improper specimen collection/handling, submission of specimen other than nasopharyngeal swab, presence of viral mutation(s) within the areas targeted by this assay, and inadequate number of viral copies(<138 copies/mL). A negative result must be combined with clinical observations, patient history, and epidemiological information. The expected result is Negative.  Fact Sheet for Patients:  EntrepreneurPulse.com.au  Fact Sheet for Healthcare Providers:   IncredibleEmployment.be  This test is no t yet approved or cleared by the [place] FDA and  has been authorized for detection and/or diagnosis of SARS-CoV-2 by FDA under an Emergency Use Authorization (EUA). This EUA will remain  in effect (meaning this test can be used) for the duration of the COVID-19 declaration under Section 564(b)(1) of the Act, 21 U.S.C.section 360bbb-3(b)(1), unless the authorization is terminated  or revoked sooner.       Influenza A by PCR NEGATIVE NEGATIVE Final   Influenza B by PCR NEGATIVE NEGATIVE Final    Comment: (NOTE) The Xpert Xpress SARS-CoV-2/FLU/RSV plus assay is intended as an aid in the diagnosis of influenza from Nasopharyngeal swab specimens and should not be used as a sole basis for treatment. Nasal washings and aspirates are unacceptable for Xpert Xpress SARS-CoV-2/FLU/RSV testing.  Fact Sheet for Patients: EntrepreneurPulse.com.au  Fact Sheet for Healthcare Providers: IncredibleEmployment.be  This test is not yet approved or cleared by the [place] FDA and has been authorized for detection and/or diagnosis of SARS-CoV-2 by FDA under an Emergency Use Authorization (EUA). This EUA will remain in effect (meaning this test can be used) for the duration of the COVID-19 declaration under Section 564(b)(1) of the Act, 21 U.S.C. section 360bbb-3(b)(1), unless the authorization is terminated or revoked.  Performed at [facility], [address] [place], [place] [address]   Resp Panel by RT-PCR (Flu A&B, Covid) Nasopharyngeal Swab     Status: None   Collection Time: 05/14/21  9:51 AM   Specimen: Nasopharyngeal Swab; Nasopharyngeal(NP) swabs in vial transport medium  Result Value Ref Range Status   SARS Coronavirus 2 by RT PCR NEGATIVE NEGATIVE Final    Comment: (NOTE) SARS-CoV-2 target nucleic acids are NOT DETECTED.  The SARS-CoV-2 RNA is generally detectable  in upper respiratory specimens during the acute phase of infection. The lowest concentration of SARS-CoV-2 viral copies this assay can detect is 138 copies/mL. A negative result does not preclude SARS-Cov-2 infection and should not be used as the sole basis for treatment or other patient management decisions. A negative result may occur with  improper specimen collection/handling, submission of specimen other than nasopharyngeal swab, presence of viral mutation(s) within the areas targeted by this assay, and inadequate number of viral copies(<138 copies/mL). A negative result must be combined with clinical observations, patient history, and epidemiological information. The expected result is Negative.  Fact Sheet for Patients:  EntrepreneurPulse.com.au  Fact Sheet for Healthcare Providers:  IncredibleEmployment.be  This test is no t yet approved or cleared by the [place] FDA and  has been authorized for detection and/or diagnosis of SARS-CoV-2 by FDA under an Emergency Use Authorization (EUA). This EUA will remain  in effect (meaning this test can be used) for the duration of the COVID-19 declaration under Section 564(b)(1) of the Act, 21 U.S.C.section 360bbb-3(b)(1), unless the authorization is terminated  or revoked sooner.       Influenza A by PCR NEGATIVE NEGATIVE Final   Influenza B by PCR NEGATIVE NEGATIVE Final    Comment: (NOTE) The Xpert Xpress SARS-CoV-2/FLU/RSV plus  assay is intended as an aid in the diagnosis of influenza from Nasopharyngeal swab specimens and should not be used as a sole basis for treatment. Nasal washings and aspirates are unacceptable for Xpert Xpress SARS-CoV-2/FLU/RSV testing.  Fact Sheet for Patients: EntrepreneurPulse.com.au  Fact Sheet for Healthcare Providers: IncredibleEmployment.be  This test is not yet approved or cleared by the [place] FDA and has  been authorized for detection and/or diagnosis of SARS-CoV-2 by FDA under an Emergency Use Authorization (EUA). This EUA will remain in effect (meaning this test can be used) for the duration of the COVID-19 declaration under Section 564(b)(1) of the Act, 21 U.S.C. section 360bbb-3(b)(1), unless the authorization is terminated or revoked.  Performed at [facility], [address] [place], [place] [address]   Culture, blood (x 2)     Status: None   Collection Time: 05/14/21  1:36 PM   Specimen: BLOOD  Result Value Ref Range Status   Specimen Description BLOOD RIGHT ANTECUBITAL  Final   Special Requests   Final    BOTTLES DRAWN AEROBIC AND ANAEROBIC Blood Culture results may not be optimal due to an inadequate volume of blood received in culture bottles   Culture   Final    NO GROWTH 5 DAYS Performed at [facility], [address] [place], [place] [address]    Report Status 05/19/2021 FINAL  Final  Culture, blood (x 2)     Status: None   Collection Time: 05/14/21  1:58 PM   Specimen: BLOOD  Result Value Ref Range Status   Specimen Description BLOOD BLOOD LEFT WRIST  Final   Special Requests   Final    BOTTLES DRAWN AEROBIC AND ANAEROBIC Blood Culture results may not be optimal due to an inadequate volume of blood received in culture bottles   Culture   Final    NO GROWTH 5 DAYS Performed at [facility], [address] [place], [place] [address]    Report Status 05/19/2021 FINAL  Final  Gastrointestinal Panel by PCR , Stool     Status: Abnormal   Collection Time: 05/16/21  8:00 AM   Specimen: Stool  Result Value Ref Range Status   Campylobacter species NOT DETECTED NOT DETECTED Final   Plesimonas shigelloides NOT DETECTED NOT DETECTED Final   Salmonella species NOT DETECTED NOT DETECTED Final   Yersinia enterocolitica NOT DETECTED NOT DETECTED Final   Vibrio species NOT DETECTED NOT DETECTED Final   Vibrio cholerae NOT DETECTED NOT DETECTED  Final   Enteroaggregative E coli (EAEC) NOT DETECTED NOT DETECTED Final   Enterotoxigenic E coli (ETEC) NOT DETECTED NOT DETECTED Final   Shiga like toxin producing E coli (STEC) DETECTED (A) NOT DETECTED Final    Comment: RESULT CALLED TO, READ BACK BY AND VERIFIED WITH: PATEL,PINALI RN AT 1853 05/16/2021 GAA    E. coli O157 NOT DETECTED NOT DETECTED Final   Shigella/Enteroinvasive E coli (EIEC) NOT DETECTED NOT DETECTED Final   Cryptosporidium NOT DETECTED NOT DETECTED Final   Cyclospora cayetanensis NOT DETECTED NOT DETECTED Final   Entamoeba histolytica NOT DETECTED NOT DETECTED Final   Giardia lamblia NOT DETECTED NOT DETECTED Final   Adenovirus F40/41 NOT DETECTED NOT DETECTED Final   Astrovirus NOT DETECTED NOT DETECTED Final   Norovirus GI/GII NOT DETECTED NOT DETECTED Final   Rotavirus A NOT DETECTED NOT DETECTED Final   Sapovirus (I, II, IV, and V) NOT DETECTED NOT DETECTED Final    Comment: Performed at [facility], [address] [place], [place] [address]  C Difficile Quick Screen w PCR reflex     Status: None   Collection Time: 05/16/21  8:00 AM   Specimen: STOOL  Result Value Ref Range Status   C Diff antigen NEGATIVE NEGATIVE Final   C Diff toxin NEGATIVE NEGATIVE Final   C Diff interpretation No C. difficile detected.  Final    Comment: Performed at [facility], [place], [place] [address]  SARS CORONAVIRUS 2 (TAT 6-24 HRS) Nasopharyngeal Nasopharyngeal Swab     Status: None   Collection Time: 05/19/21 10:43 AM   Specimen: Nasopharyngeal Swab  Result Value Ref Range Status   SARS Coronavirus 2 NEGATIVE NEGATIVE Final    Comment: (NOTE) SARS-CoV-2 target nucleic acids are NOT DETECTED.  The SARS-CoV-2 RNA is generally detectable in upper and lower respiratory specimens during the acute phase of infection. Negative results do not preclude SARS-CoV-2 infection, do not rule out co-infections with other pathogens, and should not  be used as the sole basis for treatment or other patient management decisions. Negative results must be combined with clinical observations, patient history, and epidemiological information. The expected result is Negative.  Fact Sheet for Patients: SugarRoll.be  Fact Sheet for Healthcare Providers: https://www.woods-mathews.com/  This test is not yet approved or cleared by the [place] FDA and  has been authorized for detection and/or diagnosis of SARS-CoV-2 by FDA under an Emergency Use Authorization (EUA). This EUA will remain  in effect (meaning this test can be used) for the duration of the COVID-19 declaration under Se ction 564(b)(1) of the Act, 21 U.S.C. section 360bbb-3(b)(1), unless the authorization is terminated or revoked sooner.  Performed at [facility], [place] [address], [place], [place] [address]     Time coordinating discharge: Over 30 minutes  Richarda Osmond, MD  [facility] 05/20/2021, 10:10 AM

## 2021-05-19 NOTE — Progress Notes (Signed)
Progress Note   Patient: Michele Lambert ZDG:[id number] DOB: [birth year] [name]: 05/14/2021     5 DOS: the patient was seen and examined on 05/19/2021   Brief hospital course: Michele Lambert is a 88 y.o. female with medical history significant of hypertension, hypothyroidism, dementia, CKD stage IIIa, anemia, recent surgery for left hip fracture, recent intracranial parenchymal hemorrhage, who presented on 05/14/2021 with shortness of breath and wet sounding cough after an aspiration event at SNF.    Of note, pt was just discharged to rehab on 2/7 after admission for hip fracture after a fall at her ALF/memory care.  She also had an intracranial parenchymal hemorrhage sustained in that fall but stable neurologic status with no need for intervention.    Admitted and being treated with Unasyn for aspiration pneumonia.  Assessment and Plan: Aspiration pneumonia ([place])- (present on admission) Transition Unasyn >> Augmentin to complete 5 day course. Seen by SLP for swallow evaluation and started on dysphagia 1 (pured) diet with nectar thick liquid by spoon, meds crushed in pure. Monitor respiratory and oxygen status, supplemental O2 if sats below 90% on room air. Supportive care.  E coli infection- 4 episodes of stool yesterday Diarrhea was present on admission, non-bloody.   C. difficile negative. GI panel showed E.coli Enteric precautions.   Monitor electrolytes and hydration status.  Normocytic anemia- (present on admission) Hemoglobin stable.  Monitor CBC  Chronic kidney disease, stage 3a ([place])- (present on admission) Renal function near baseline.   Monitor BMP off fluids.  Elevated troponin- (present on admission) Likely due to demand ischemia in setting of respiratory distress following aspiration event.  Initial troponins were 19 and later 15.  Patient without apparent chest pain.  Sepsis ([place])- (present on admission) Present on admission with leukocytosis, tachycardia and tachypnea in  the setting of aspiration episode and pneumonitis/pneumonia. Sepsis physiology has resolved. Monitor hemodynamics.  Continue antibiotics to complete course for aspiration.    History of fracture of left hip Repaired prior admission, discharged to SNF on 2/7.  Continue PT and OT.  Plan is to return to SNF for rehab 2/16.   - Fall precautions.   - Pain control as needed but avoid narcotics as much as possible given dementia and risk for delirium.    Essential hypertension- (present on admission) Home HCTZ and losartan were held on admission in the setting of sepsis and risk of hypotension.   BP is stable, mildly elevated. --Resumed losartan --Continue holding HCTZ for now  Traumatic intraparenchymal hemorrhage- (present on admission) Sustained in fall which also caused her hip fracture. Avoid anticoagulants.   SCDs for VTE prophylaxis. With underlying dementia, expect cognition may worsen or not recover to prior baseline.  Dementia ([place])- (present on admission) Patient resides at ALF memory care unit. Delirium and fall precautions. Palliative care consulted for [place] discussions.  Code status is DNR.  Hypothyroidism- (present on admission) Continue levothyroxine  Subjective: patient reports no complaints. Denies pain in her hip. Denies respiratory complaints    Physical Exam: Vitals:   05/16/21 1603 05/17/21 0425 05/17/21 0739 05/17/21 0751  BP: 118/69 (!) 141/83 (!) 152/79 (!) 142/84  Pulse: 100 95 86 86  Resp: 18 18 16 16   Temp: 97.8 F (36.6 C) 98.4 F (36.9 C) 98.5 F (36.9 C) 99 F (37.2 C)  TempSrc: Oral Axillary  Axillary  SpO2: 96% 97% 98% 98%  Weight:      Height:       General exam: awake, alert, no acute distress  HEENT: moist mucus membranes, hearing grossly normal  Respiratory system: normal respiratory effort, on room air. Cardiovascular system: normal S1/S2, RRR. Bilateral pitting edema to LEs Central nervous system: no gross focal neurologic deficits,  normal speech but minimally conversant due to dementia Skin: dry, intact, no rashes, lesions or ulcers seen on visualized skin Psychiatry: normal mood  Data Reviewed:  Cl- 112, Cr 1.01, GFR 52 WBC 16.3, hgb 9.2 Otherwise unremarkable  Family Communication: none  Disposition: Status is: Inpatient Remains inpatient appropriate because: Requires SNF placement for rehab which is pending. Medically ready for discharge when bed available    Planned Discharge Destination: Skilled nursing facility  VTE ppx: SCDs Start: 05/14/21 1200   Time spent: >35 minutes  Author: Richarda Osmond, MD 05/19/2021 7:50 AM  For on call review [email or web address].

## 2021-05-20 DIAGNOSIS — F0150 Vascular dementia without behavioral disturbance: Secondary | ICD-10-CM

## 2021-05-20 DIAGNOSIS — N1831 Chronic kidney disease, stage 3a: Secondary | ICD-10-CM | POA: Diagnosis not present

## 2021-05-20 DIAGNOSIS — D649 Anemia, unspecified: Secondary | ICD-10-CM

## 2021-05-20 DIAGNOSIS — I10 Essential (primary) hypertension: Secondary | ICD-10-CM | POA: Diagnosis not present

## 2021-05-20 DIAGNOSIS — J690 Pneumonitis due to inhalation of food and vomit: Secondary | ICD-10-CM | POA: Diagnosis not present

## 2021-05-20 NOTE — Care Management Important Message (Signed)
Important Message  Patient Details  Name: JAYCI ELLEFSON MRN: [id number] Date of Birth: [birth year]   Medicare Important Message Given:  Yes  Reviewed Medicare IM with daughter, Raquel Sarna, at [phone].  Copy of Medicare IM sent securely to daughter's attention at [email or web address].     Dannette Barbara 05/20/2021, 1:11 PM

## 2021-05-20 NOTE — Progress Notes (Signed)
Patient to be transported to [facility] via EMS.  Report called to Caron Presume, RN Patient will be going to Rm [id number]

## 2021-05-20 NOTE — Progress Notes (Signed)
Speech Language Pathology Treatment: Dysphagia  Patient Details Name: Michele Lambert MRN: [id number] DOB: 1926-09-20 Today's Date: 05/20/2021 Time: 3149-7026 SLP Time Calculation (min) (ACUTE ONLY): 30 min  Assessment / Plan / Recommendation Clinical Impression  Pt seen today for ongoing monitoring of toleration of dysphagia diet including Nectar consistency liquids(recent upgrade to Nectar liquids by TSP in diet). Son present reported she was alert this morning and had done well at the breakfast meals. Pt was resting in bed fully awake and verbally engaged w/ this SLP and Son intermittently given cues.   Pt and Son given thorough education on pt's dysphagia and issues impacting her swallowing including recent stroke and Baseline Cognitive decline/Dementia. Discussed that any decline in Cognitive status, baseline Dementia, can impact overall oral awareness/engagement w/ po tasks which increases risk for aspiration. Pt is at MOD risk for aspiration d/t impact of Baseline Dementia, recent acute CVA(basal ganglia) and Fall w/ hip fx/repair, advanced age, medications, and deconditioned State. Risk can be reduced somewhat when following aspiration precautions, given feeding support and cues, and when using a modified Dysphagia diet including Nectar consistency liquids via tsp.  Discussed and reviewed handouts on the aspiration precautions and strategies including verbal/tactile/visual cues during food/liquid presentation w/ monitoring for oral clearing b/t bites. Son stated he felt more comfortable now w/ pt's eating/drinking; and diet.    D/t overall medical and Cognitive presentations, and risk for aspiration from oropharyngeal phase deficits, recommend continue a Dysphagia level 1(PUREE foods moistened well) w/ Nectar liquids VIA TSP -- no straws. Aspiration precautions including reduce Distractions during meals and engage pt in self-feeding w/ support offered, check for oral clearing during/post intake as  needed, cues for oral Prep/acceptance of po boluses and cues to swallow/clear and alternate foods/liquids. Pills Crushed in Puree for safer swallowing. NSG/MD updated.   Recommend ST services f/u at SNF/Rehab for ongoing assessment, education, and any trial upgrade of diet post the Acuity of this illness/admit secondary to Baseline Dementia/Cognitive decline. Recommend f/u w/ Palliative Care services for support and GOC. Thorough Education given to Son present this tx session; handouts. Discussed diet consistency of foods/thickened liquids(Nectar) for now and at Rehab; aspiration precautions; pills Crushed in puree; feeding support and Supervision; tactile/verbal and visual cues to take po's/Prep and education on monitoring swallowing; and f/u w/ Dietician for food/liquids options to enhance oral intake including supplements. Precautions posted in room.      HPI HPI: Per admitting H & P "Michele Lambert is a 88 y.o. female with medical history significant of hypertension, hypothyroidism, dementia, CKD stage IIIa, anemia, recent surgery for left hip fracture, recent intracranial parenchymal hemorrhage, who presents SOB.     Patient has dementia, and cannot provide likely with medical history.  Per his son at the bedside, at normal baseline, patient recognizes her son sometimes, but most of the time pt is not orientated to place and time. Pt's mental status is close to baseline per her son. Patient was recently hospitalized from 2/3 - 2/7 due to left hip fracture and intracranial parenchymal hemorrhage after fall. She under went left hip surgery and is currently doing rehab in SNF. Pt was found to have SOB and cough with little mucus production in facility.  Patient has gurgling sound, and seem to have difficulty swallowing.  Does not seem to have chest pain.  No fever or chills.  No active nausea, vomiting, diarrhea noted.  Patient moves all extremities." On previous admission Pt was placxed on Dys 1 diet with  nectar thick liqids by TSP both admissions in order to address Dysphagia and safe oral intake to meet nutritional needs.      SLP Plan  Continue with current plan of care (while admitted)      Recommendations for follow up therapy are one component of a multi-disciplinary discharge planning process, led by the attending physician.  Recommendations may be updated based on patient status, additional functional criteria and insurance authorization.    Recommendations  Diet recommendations: Dysphagia 1 (puree);Nectar-thick liquid Liquids provided via: Teaspoon (only) Medication Administration: Crushed with puree Supervision: Staff to assist with self feeding;Full supervision/cueing for compensatory strategies Compensations: Minimize environmental distractions;Slow rate;Small sips/bites;Lingual sweep for clearance of pocketing;Multiple dry swallows after each bite/sip;Follow solids with liquid Postural Changes and/or Swallow Maneuvers: Out of bed for meals;Seated upright 90 degrees;Upright 30-60 min after meal                General recommendations:  (Dietician f/u; Palliative Care f/u) Oral Care Recommendations: Oral care BID;Oral care before and after PO;Staff/trained caregiver to provide oral care Follow Up Recommendations: Skilled nursing-short term rehab (<3 hours/day) Assistance recommended at discharge: Frequent or constant Supervision/Assistance SLP Visit Diagnosis: Dysphagia, oropharyngeal phase (R13.12) (recent stroke; Fall w/ hip fx; Baseline Dementia) Plan: Continue with current plan of care (while admitted)           [facility]  05/20/2021, 2:01 PM

## 2021-05-20 NOTE — Plan of Care (Signed)
IV removed, report called to [facility] and patient transported via EMS

## 2021-05-20 NOTE — TOC Transition Note (Signed)
Transition of Care [facility]) - CM/SW Discharge Note   Patient Details  Name: Michele Lambert MRN: [id number] Date of Birth: 31-Jan-1927  [facility] CM/SW Contact:  Beverly Sessions, RN Phone Number: 05/20/2021, 11:52 AM   Clinical Narrative:     Patient will DC [facility] Anticipated DC date: 05/20/21  Family notified:Son bentley Transport by: EMS  Per MD patient ready for DC to . RN, patient, patient's family, and facility notified of DC. Discharge Summary sent to facility. RN given number for report. DC packet on chart. Ambulance transport requested for patient.  TOC signing off.  [facility] [phone]     Barriers to Discharge: Continued Medical Work up   Patient Goals and CMS Choice     Choice offered to / list presented to : Adult Children  Discharge Placement                       Discharge Plan and Services   Discharge Planning Services: CM Consult Post Acute Care Choice: Skilled Nursing Facility                               Social Determinants of Health (SDOH) Interventions     Readmission Risk Interventions No flowsheet data found.

## 2021-06-17 ENCOUNTER — Ambulatory Visit (INDEPENDENT_AMBULATORY_CARE_PROVIDER_SITE_OTHER): Payer: Medicare Other | Admitting: *Deleted

## 2021-06-17 ENCOUNTER — Encounter: Payer: Self-pay | Admitting: Urology

## 2021-06-17 ENCOUNTER — Other Ambulatory Visit: Payer: Self-pay

## 2021-06-17 ENCOUNTER — Encounter: Payer: Self-pay | Admitting: *Deleted

## 2021-06-17 ENCOUNTER — Ambulatory Visit (INDEPENDENT_AMBULATORY_CARE_PROVIDER_SITE_OTHER): Payer: Medicare Other | Admitting: Urology

## 2021-06-17 VITALS — BP 144/89 | HR 105 | Ht 65.0 in | Wt 146.0 lb

## 2021-06-17 DIAGNOSIS — R339 Retention of urine, unspecified: Secondary | ICD-10-CM

## 2021-06-17 LAB — BLADDER SCAN AMB NON-IMAGING: Scan Result: 105

## 2021-06-17 NOTE — Progress Notes (Signed)
Bladder Scan ?Patient returned to clinic via stretcher for a PVR. Bladder scan showed 105 in bladder. Patient's daughter reports patient having 6 cups of water prior. Unaware if patient voided in bed pan.  ?Given order to replace catheter by 7pm if unable to void or in pain per Dr. Bernardo Heater.  ? ?Return to clinic in one month for PVR ?

## 2021-06-17 NOTE — Progress Notes (Signed)
Catheter Removal ? ?Patient is present today for a catheter removal.  77m of water was drained from the balloon. A 14FR foley cath was removed from the bladder no complications were noted . Patient tolerated well. ? ?Performed by: JGaspar ColaCMA ? ?Follow up/ Additional notes:  after noon ?  ?

## 2021-06-19 ENCOUNTER — Encounter: Payer: Self-pay | Admitting: Urology

## 2021-06-19 NOTE — Progress Notes (Signed)
? ?06/17/2021 ?9:27 PM  ? ?[place] ?Jan 27, 1927 ?[id number] ? ?Referring provider: Gwynne Edinger, MD ?[address] ?[address] Floor ?[address] ? ?Chief Complaint  ?Patient presents with  ? Other  ? ? ?HPI: ?Michele Lambert is a 88 y.o. female who presents in follow-up for recent hospitalization where she was found to have urinary retention.  She presents today with her daughter. ? ?She was seen earlier this morning and had her catheter removed. ?She presents today for follow-up bladder scan.  History was obtained by chart review and her daughter ?History of dementia and fell at her memory care facility 05/07/2021 ?Evaluation remarkable for head CT with intraparenchymal hemorrhage in the right basal ganglia and a comminuted left intertrochanteric hip fracture ?Family elected to follow her intraparenchymal hemorrhage without surgical intervention ?She did undergo intramedullary fixation of her left hip fracture on 05/07/2021 ?Developed postoperative urinary retention initially managed with I/O catheterization.  Prior to discharge bladder scan showed a volume of 600 mL and she was unable to void and a Foley catheter was placed ?No previous history of urinary retention ?Her daughter states she has maintain hydration today and has not voided.  She has not been uncomfortable ?Bladder scan was performed which showed an estimated volume of 105 mL ? ?PMH: ?Past Medical History:  ?Diagnosis Date  ? Cancer [facility])   ? skin cancer right leg  ? CVA (cerebral vascular accident) ([place]) 05/06/2021  ? left side weakness  ? Dementia ([place])   ? Hypertension   ? Hypothyroidism   ? ? ?Surgical History: ?Past Surgical History:  ?Procedure Laterality Date  ? INTRAMEDULLARY (IM) NAIL INTERTROCHANTERIC Left 05/07/2021  ? Procedure: INTRAMEDULLARY (IM) NAIL INTERTROCHANTRIC;  Surgeon: Thornton Park, MD;  Location: [facility] ORS;  Service: Orthopedics;  Laterality: Left;  ? ORIF HUMERUS FRACTURE Right 05/02/2018  ? Procedure: OPEN REDUCTION INTERNAL  FIXATION (ORIF) DISTAL HUMERUS FRACTURE;  Surgeon: Hiram Gash, MD;  Location: [place];  Service: Orthopedics;  Laterality: Right;  ? ? ?Home Medications:  ?Allergies as of 06/17/2021   ? ?   Reactions  ? Codeine   ? Lescol [fluvastatin Sodium]   ? ?  ? ?  ?Medication List  ?  ? ?  ? Accurate as of June 17, 2021 11:59 PM. If you have any questions, ask your nurse or doctor.  ?  ?  ? ?  ? ?acetaminophen 650 MG CR tablet ?Commonly known as: TYLENOL ?Take 650 mg by mouth every 8 (eight) hours as needed. ?  ?Calcium Carb-Cholecalciferol 600-10 MG-MCG Tabs ?Take 1 tablet by mouth 2 (two) times daily. ?  ?hydrochlorothiazide 25 MG tablet ?Commonly known as: HYDRODIURIL ?Take 1 tablet by mouth daily. ?  ?levothyroxine 125 MCG tablet ?Commonly known as: SYNTHROID ?Take 125 mcg by mouth every morning. ?  ?losartan 50 MG tablet ?Commonly known as: COZAAR ?Take 50 mg by mouth every evening. ?  ?polyethylene glycol 17 g packet ?Commonly known as: MIRALAX / GLYCOLAX ?Take 17 g by mouth daily. ?What changed:  ?when to take this ?reasons to take this ?  ? ?  ? ? ?Allergies:  ?Allergies  ?Allergen Reactions  ? Codeine   ? Lescol [Fluvastatin Sodium]   ? ? ?Family History: ?Family History  ?Problem Relation Age of Onset  ? Stroke Mother   ? ? ?Social History:  reports that she has never smoked. She has never used smokeless tobacco. She reports that she does not currently use alcohol. She reports that she does  not use drugs. ? ? ?Physical Exam: ?BP (!) 144/89   Pulse (!) 105   Ht '5\' 5"'$  (1.651 m)   Wt 146 lb (66.2 kg)   BMI 24.30 kg/m?   ?Constitutional:  Alert, no acute distress. ?HEENT: [place] AT, moist mucus membranes.  Trachea midline, no masses. ?Respiratory: Normal respiratory effort, no increased work of breathing. ? ? ?Assessment & Plan:   ? ?1.  Urinary retention ?She has not voided though estimated volume was only 105 mL ?We will give her a few more hours to void ?An order was written for her SNF to place a 41 French Foley  catheter if she has not voided within the next 2-3 hours ?If she does successfully void follow-up 30 days for repeat bladder scan ?If she develops recurrent urinary retention will return earlier for repeat voiding trial ? ? ?Michele Sons, MD ? ?[place] ?[address] ?[place], [place] [address] ?[phone] ? ?

## 2021-07-19 NOTE — Progress Notes (Deleted)
07/20/2021 1:06 PM   Michele Lambert Jul 05, 1926 [id number]  Referring provider: Dion Body, MD [place] [facility] [place],  [place] [address]  No chief complaint on file.  Urological history: 1. Urinary retention -Contributing factors of post-operative state, age, dementia, nonambulatory and constipation -PVR ***  HPI: Michele Lambert is a 88 y.o. female who presents today for follow up PVR.  She appeared to have passed a voiding trial on June 19, 2021.      PMH: Past Medical History:  Diagnosis Date   Cancer ([place])    skin cancer right leg   CVA (cerebral vascular accident) ([place]) 05/06/2021   left side weakness   Dementia ([place])    Hypertension    Hypothyroidism     Surgical History: Past Surgical History:  Procedure Laterality Date   INTRAMEDULLARY (IM) NAIL INTERTROCHANTERIC Left 05/07/2021   Procedure: INTRAMEDULLARY (IM) NAIL INTERTROCHANTRIC;  Surgeon: Thornton Park, MD;  Location: [facility] ORS;  Service: Orthopedics;  Laterality: Left;   ORIF HUMERUS FRACTURE Right 05/02/2018   Procedure: OPEN REDUCTION INTERNAL FIXATION (ORIF) DISTAL HUMERUS FRACTURE;  Surgeon: Hiram Gash, MD;  Location: [place];  Service: Orthopedics;  Laterality: Right;    Home Medications:  Allergies as of 07/20/2021       Reactions   Codeine    Lescol [fluvastatin Sodium]         Medication List        Accurate as of July 19, 2021  1:06 PM. If you have any questions, ask your nurse or doctor.          acetaminophen 650 MG CR tablet Commonly known as: TYLENOL Take 650 mg by mouth every 8 (eight) hours as needed.   Calcium Carb-Cholecalciferol 600-10 MG-MCG Tabs Take 1 tablet by mouth 2 (two) times daily.   hydrochlorothiazide 25 MG tablet Commonly known as: HYDRODIURIL Take 1 tablet by mouth daily.   levothyroxine 125 MCG tablet Commonly known as: SYNTHROID Take 125 mcg by mouth every morning.   losartan 50 MG tablet Commonly known as:  COZAAR Take 50 mg by mouth every evening.   polyethylene glycol 17 g packet Commonly known as: MIRALAX / GLYCOLAX Take 17 g by mouth daily. What changed:  when to take this reasons to take this        Allergies:  Allergies  Allergen Reactions   Codeine    Lescol [Fluvastatin Sodium]     Family History: Family History  Problem Relation Age of Onset   Stroke Mother     Social History:  reports that she has never smoked. She has never used smokeless tobacco. She reports that she does not currently use alcohol. She reports that she does not use drugs.  ROS: Pertinent ROS in HPI  Physical Exam: There were no vitals taken for this visit.  Constitutional:  Well nourished. Alert and oriented, No acute distress. HEENT: [place] AT, moist mucus membranes.  Trachea midline, no masses. Cardiovascular: No clubbing, cyanosis, or edema. Respiratory: Normal respiratory effort, no increased work of breathing. GI: Abdomen is soft, non tender, non distended, no abdominal masses. Liver and spleen not palpable.  No hernias appreciated.  Stool sample for occult testing is not indicated.   GU: No CVA tenderness.  No bladder fullness or masses.  *** external genitalia, *** pubic hair distribution, no lesions.  Normal urethral meatus, no lesions, no prolapse, no discharge.   No urethral masses, tenderness and/or tenderness. No bladder fullness, tenderness or  masses. *** vagina mucosa, *** estrogen effect, no discharge, no lesions, *** pelvic support, *** cystocele and *** rectocele noted.  No cervical motion tenderness.  Uterus is freely mobile and non-fixed.  No adnexal/parametria masses or tenderness noted.  Anus and perineum are without rashes or lesions.   ***  Skin: No rashes, bruises or suspicious lesions. Lymph: No cervical or inguinal adenopathy. Neurologic: Grossly intact, no focal deficits, moving all 4 extremities. Psychiatric: Normal mood and affect.    Laboratory Data: Lab Results   Component Value Date   WBC 16.3 (H) 05/19/2021   HGB 9.2 (L) 05/19/2021   HCT 28.9 (L) 05/19/2021   MCV 87.8 05/19/2021   PLT 363 05/19/2021    Lab Results  Component Value Date   CREATININE 1.01 (H) 05/19/2021    Lab Results  Component Value Date   HGBA1C 5.3 05/14/2021       Component Value Date/Time   CHOL 175 05/15/2021 0411   HDL 35 (L) 05/15/2021 0411   CHOLHDL 5.0 05/15/2021 0411   VLDL 15 05/15/2021 0411   LDLCALC 125 (H) 05/15/2021 0411    Lab Results  Component Value Date   AST 29 05/07/2021   Lab Results  Component Value Date   ALT 15 05/07/2021  I have reviewed the labs.   Pertinent Imaging: ***  Assessment & Plan:  ***  1. Urinary retention ***  No follow-ups on file.  These notes generated with voice recognition software. I apologize for typographical errors.  Zara Council, PA-C  [facility] [address]  [place], [place] [address] [phone]

## 2021-07-20 ENCOUNTER — Ambulatory Visit: Payer: Medicare Other | Admitting: Urology

## 2021-07-20 DIAGNOSIS — R339 Retention of urine, unspecified: Secondary | ICD-10-CM

## 2021-07-21 ENCOUNTER — Encounter: Payer: Self-pay | Admitting: Urology

## 2022-11-03 DEATH — deceased

## 2023-02-21 IMAGING — CT CT CERVICAL SPINE W/O CM
3 of 4 series · 10 of 33 positions shown, 12 images · non-contrast
Comparison: CT head 07/05/2007

CLINICAL DATA: Fall.  Head injury

EXAM:
CT HEAD WITHOUT CONTRAST
CT CERVICAL SPINE WITHOUT CONTRAST
TECHNIQUE: Multidetector CT imaging of the head and cervical spine was
performed following the standard protocol without intravenous
contrast. Multiplanar CT image reconstructions of the cervical spine
were also generated.

[Series 6: sagittal bone · sagittal · 0.25mm/px · 5 of 49 slices shown, 6 images]
[im 17/49  bone]
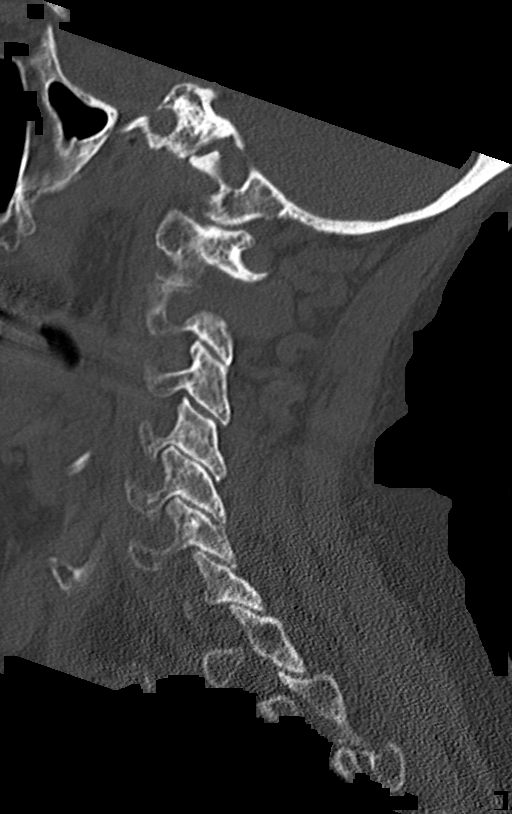
[im 21/49  bone]
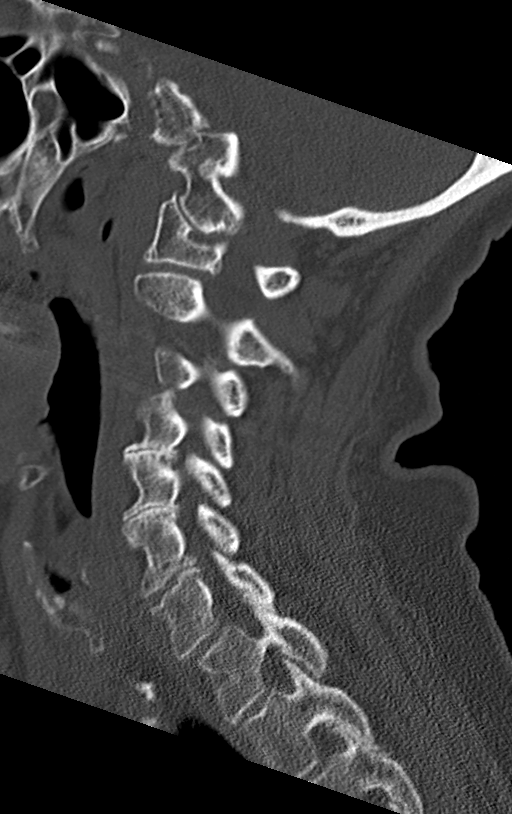
[im 25/49  soft-tissue]
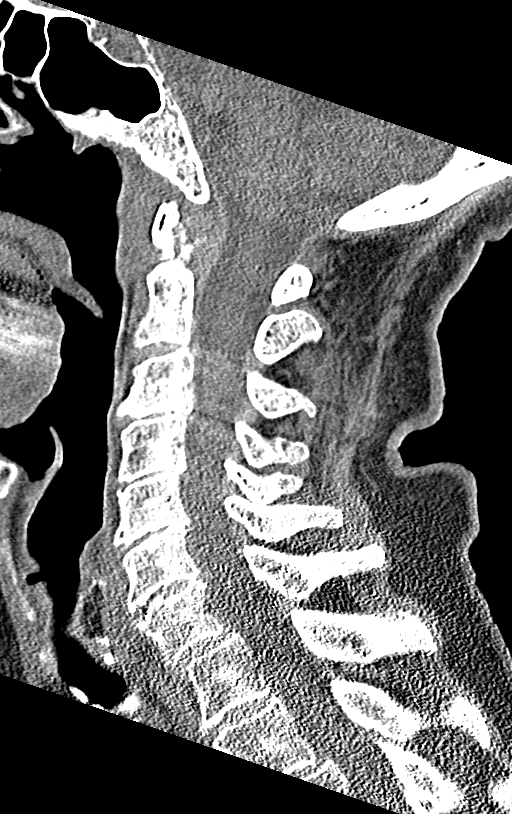
[im 25/49  bone]
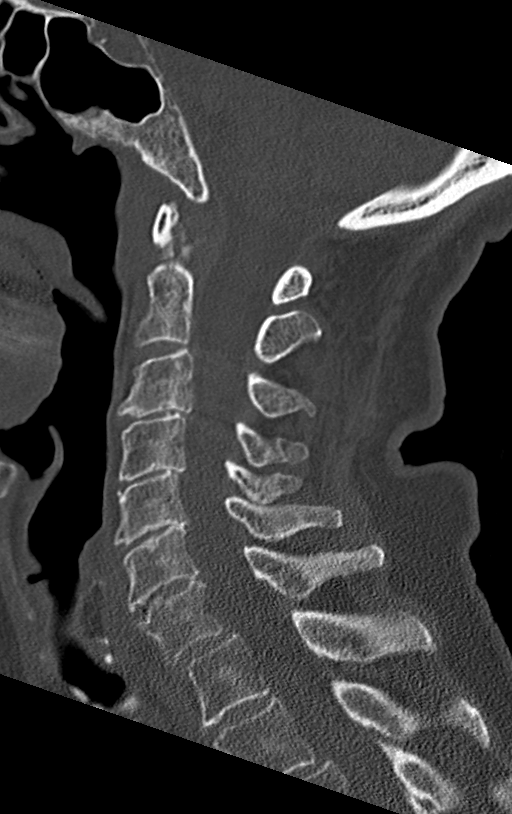
[im 29/49  bone]
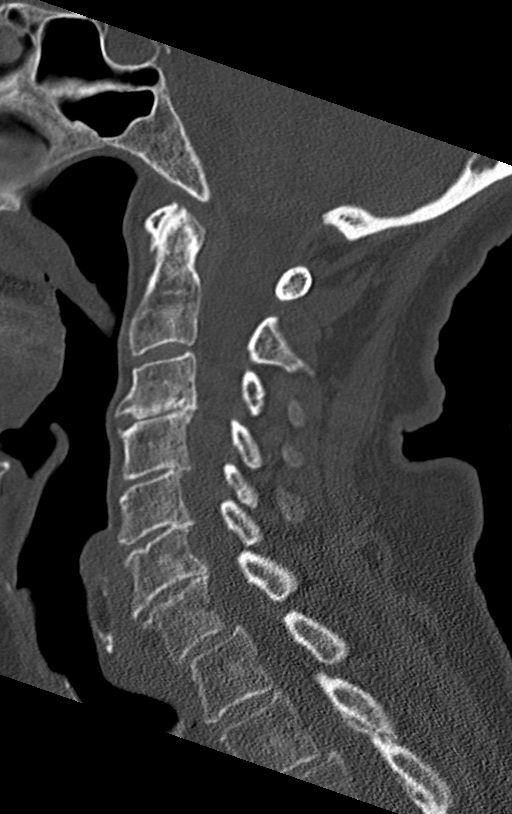
[im 33/49  bone]
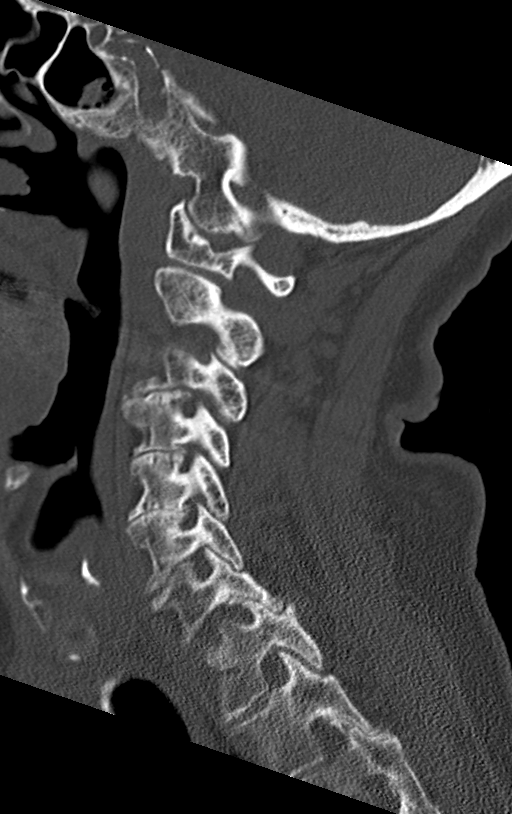

[Series 7: coronal bone · coronal · 0.19mm/px · 3 of 64 slices shown]
[im 13/64  bone]
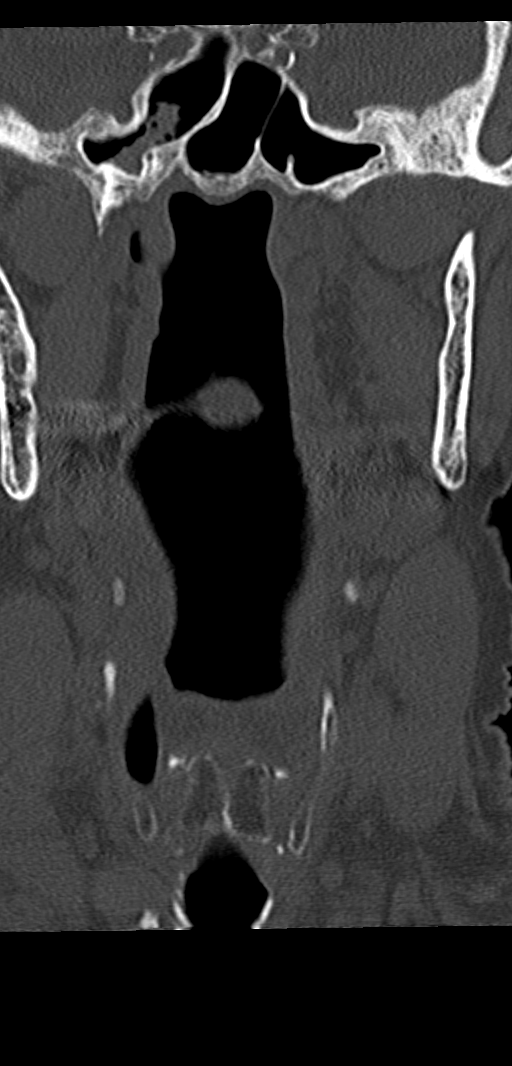
[im 26/64  bone]
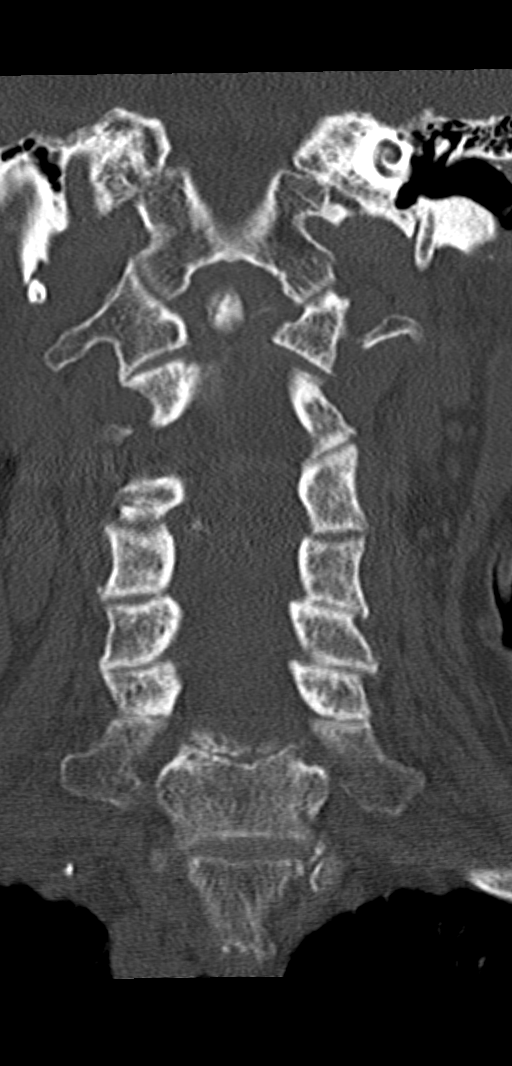
[im 38/64  bone]
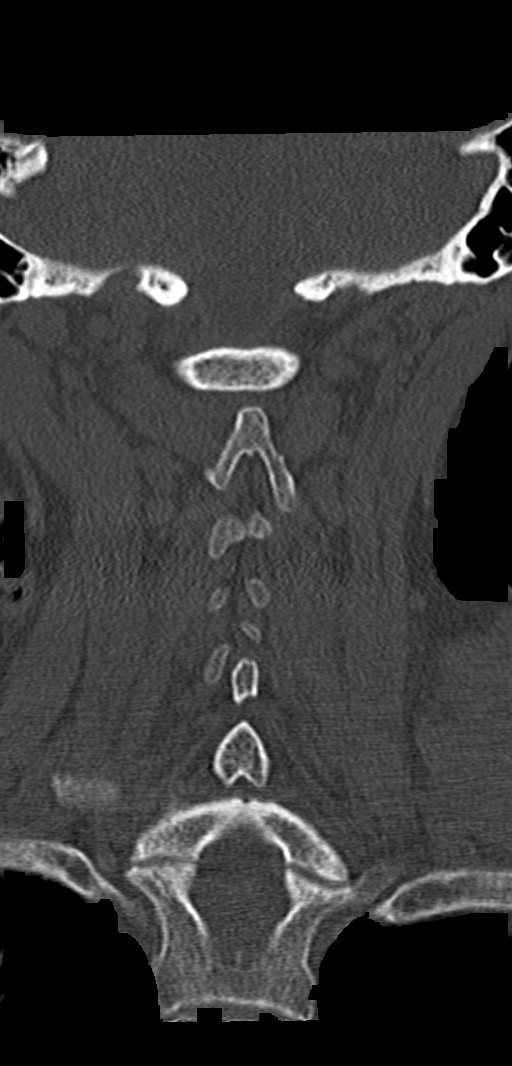

[Series 8: orthogonal bone · axial · 0.19mm/px · z∈[-226,-143]mm · 2 of 102 slices shown, 3 images]
[im 29/102  soft-tissue]
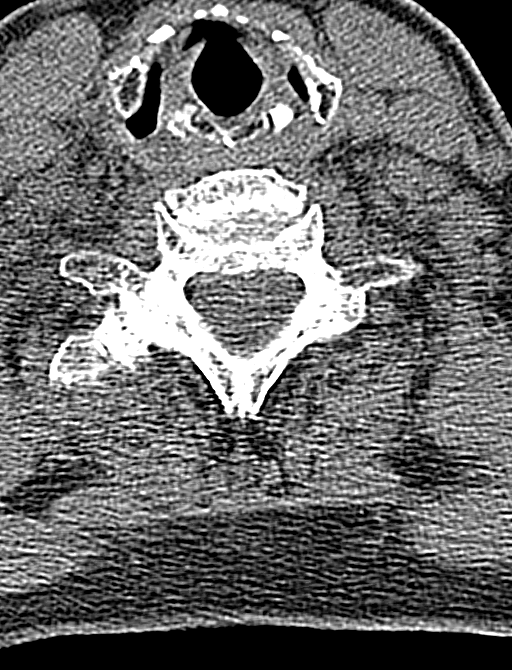
[im 29/102  bone]
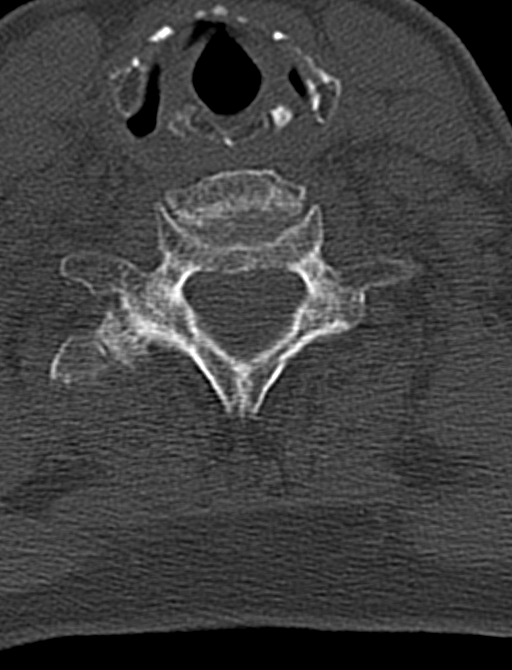
[im 73/102  bone]
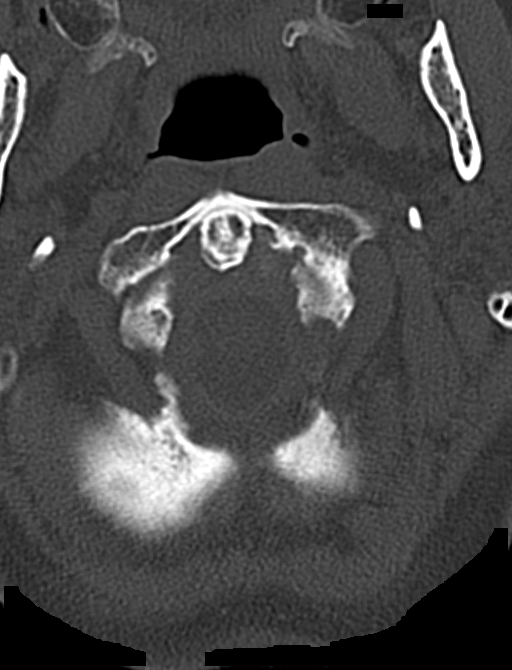

[10 of 33 positions shown; findings below may reference images not displayed]

FINDINGS: CT HEAD FINDINGS

Brain: Moderate atrophy with significant progression. Moderate white
matter hypodensity diffusely with progression.

Negative for acute infarct, hemorrhage, mass.

Vascular: Negative for hyperdense vessel

Skull: Negative

Sinuses/Orbits: Mild mucosal edema right sphenoid sinus otherwise
clear. Bilateral cataract extraction

Other: None

CT CERVICAL SPINE FINDINGS

Alignment: Mild retrolisthesis C3-4, C4-5, C5-6. 3 mm
anterolisthesis C7-T1

Skull base and vertebrae: Negative for fracture

Soft tissues and spinal canal: Negative

Disc levels: Multilevel disc degeneration and spurring. Asymmetric
facet degeneration on the right C7-T1. Foraminal narrowing
bilaterally at multiple levels due to spurring.

Upper chest: Lung apices clear bilaterally.

Other: None
IMPRESSION: 1. No acute intracranial abnormality. Significant progression of
atrophy and small vessel ischemia the white matter since [DATE]. Cervical spondylosis.  Negative for fracture.

## 2023-02-21 IMAGING — CR DG RIBS W/ CHEST 3+V*L*
4 series · 4 of 4 positions shown · non-contrast
Comparison: 05/01/2018

CLINICAL DATA: Fall

EXAM:
LEFT RIBS AND CHEST - 3+ VIEW

[chest pa]
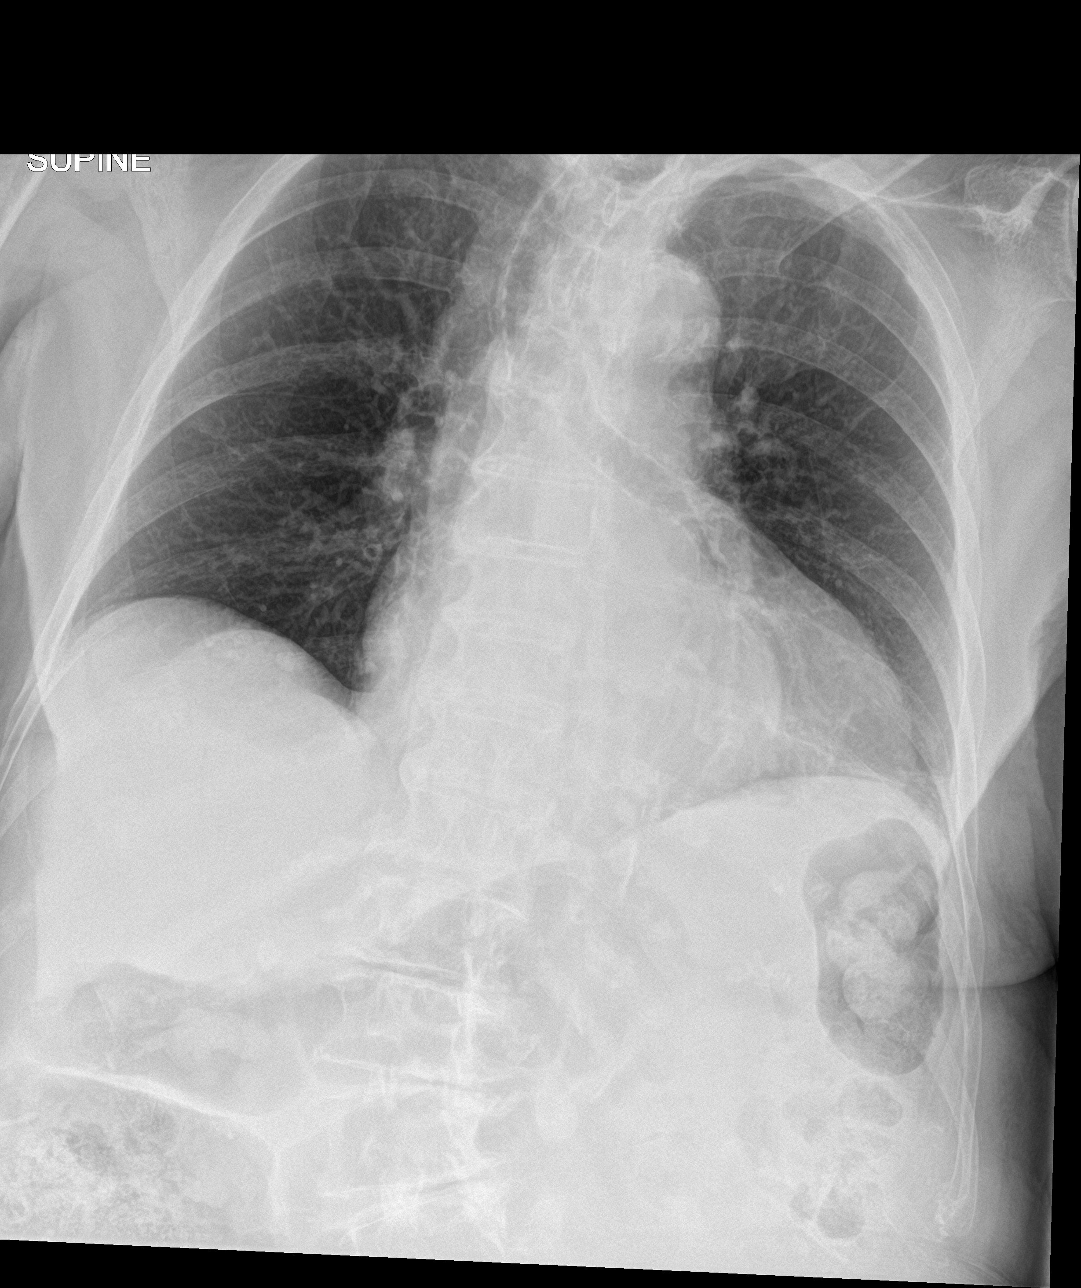

[rib ap (1 of 2)]
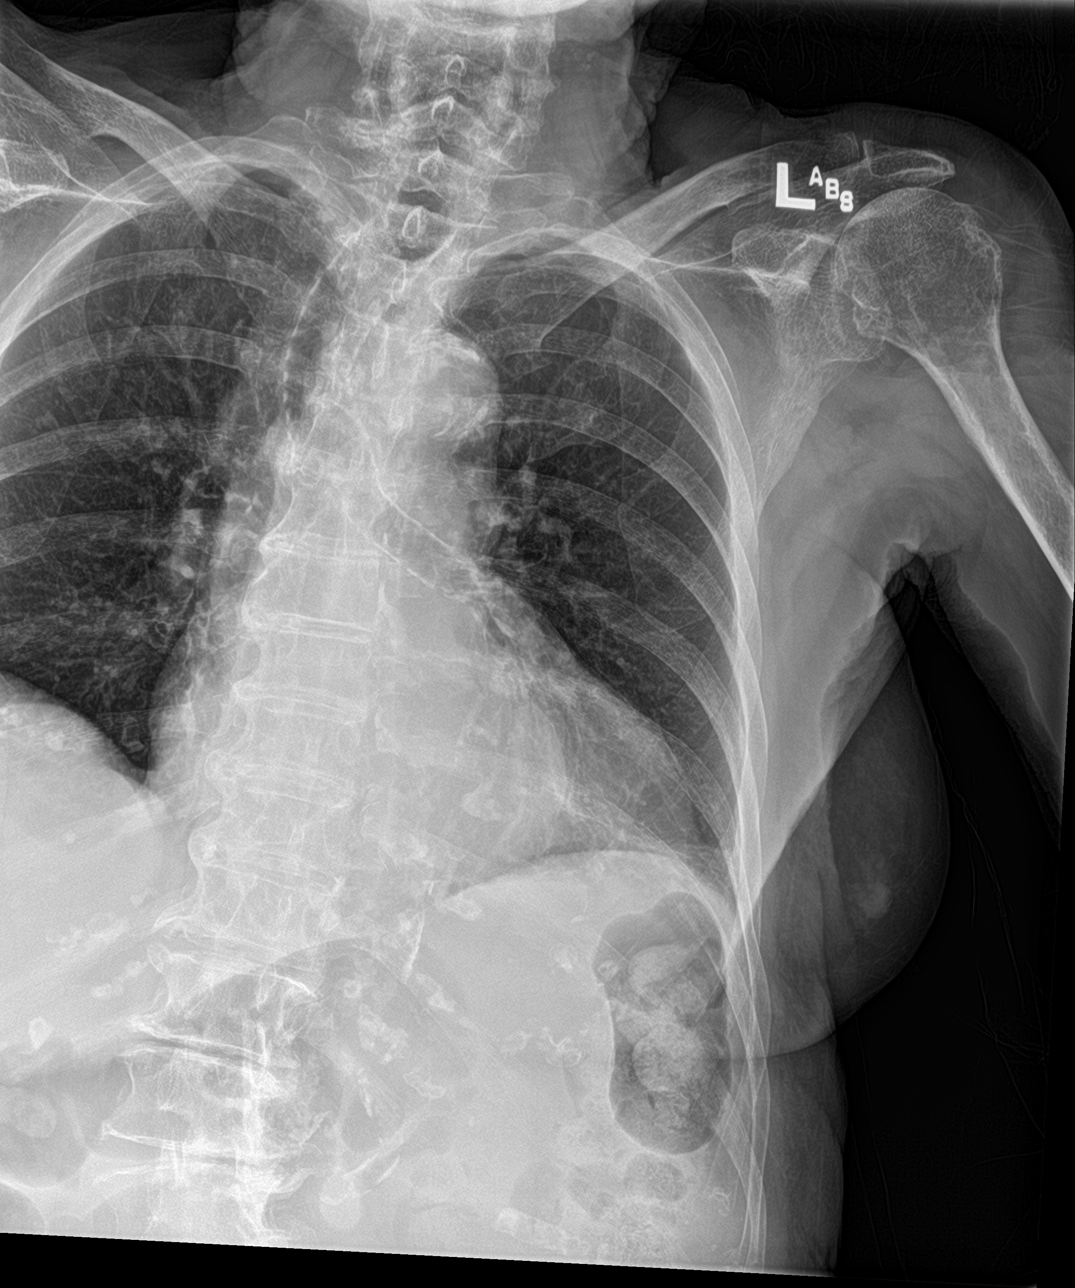

[rib ap obl]
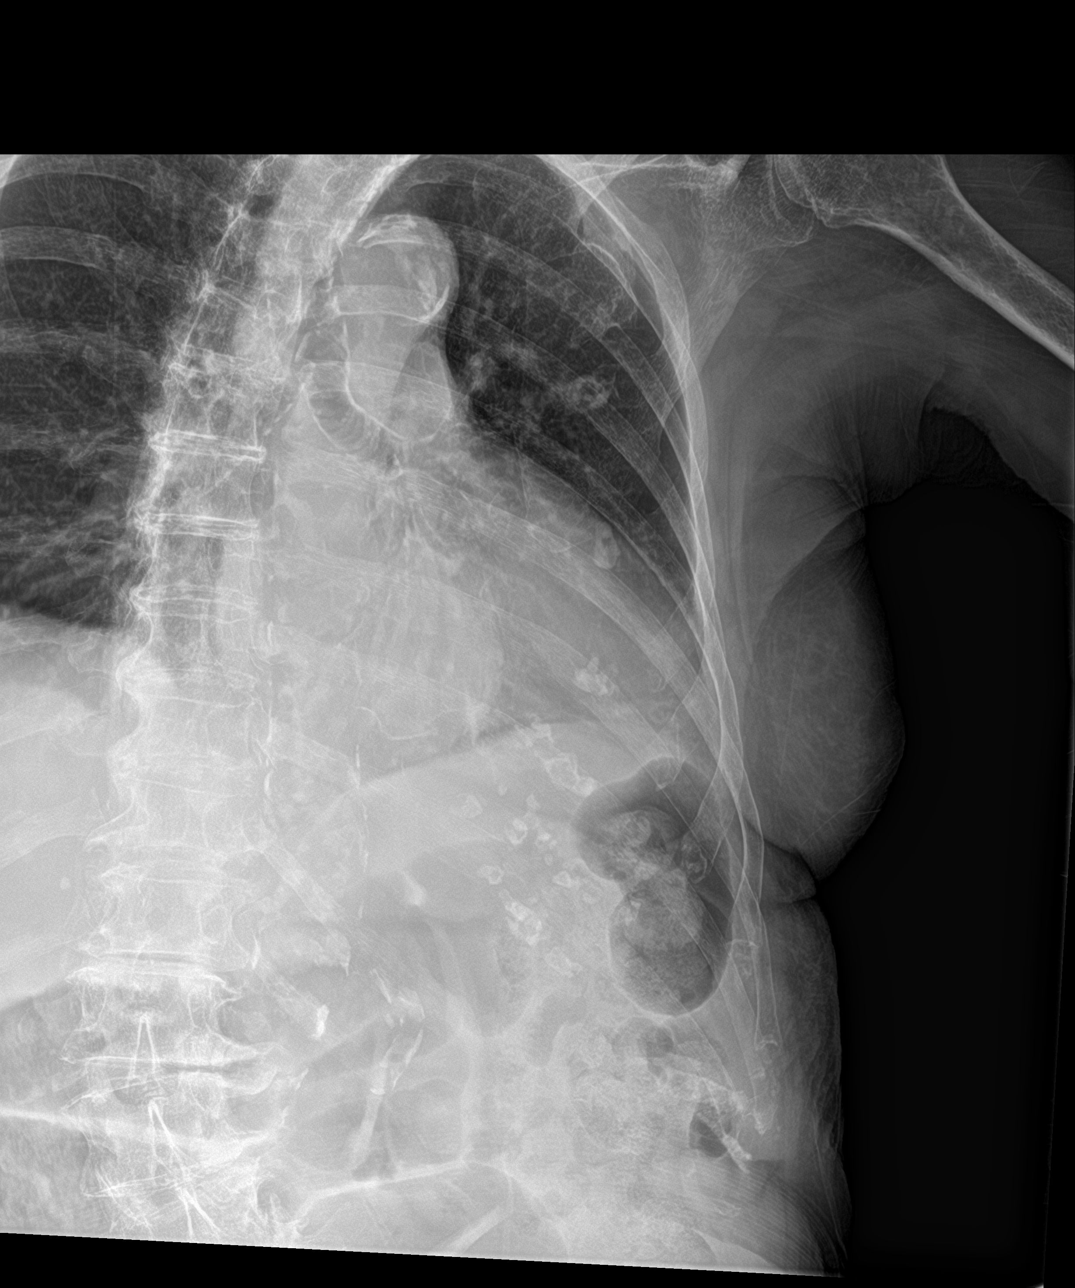

[rib ap (2 of 2)]
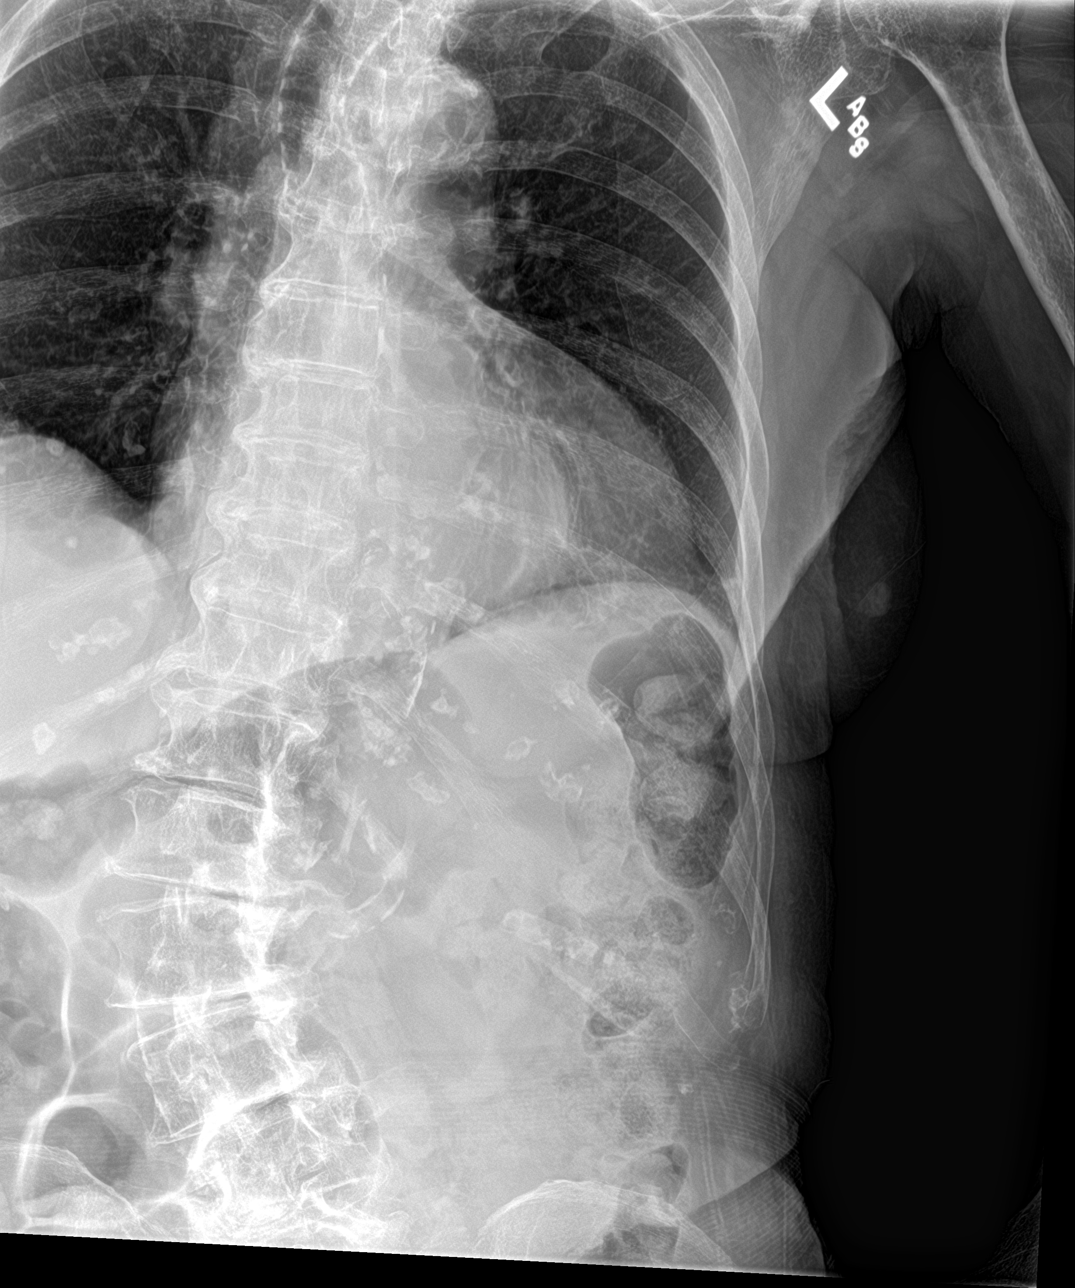

[4 of 4 positions shown; findings below may reference images not displayed]

FINDINGS: Cardiac enlargement without heart failure. Atherosclerotic aortic
arch.

Minimal left lower lobe atelectasis. Lungs otherwise clear. No
effusion or pneumothorax.

Possible fracture left anterior seventh rib without significant
displacement.
IMPRESSION: Possible nondisplaced fracture left seventh rib anteriorly.

Mild left lower lobe atelectasis.  Lungs otherwise clear.
